# Patient Record
Sex: Female | Born: 1960 | Race: Black or African American | Hispanic: No | Marital: Married | Smoking: Never smoker
Health system: Southern US, Community
[De-identification: ages and names within clinical notes are randomized; demographics above are authoritative.]

## PROBLEM LIST (undated history)

## (undated) DIAGNOSIS — I10 Essential (primary) hypertension: Secondary | ICD-10-CM

## (undated) DIAGNOSIS — K7460 Unspecified cirrhosis of liver: Secondary | ICD-10-CM

## (undated) DIAGNOSIS — N183 Chronic kidney disease, stage 3 unspecified: Secondary | ICD-10-CM

## (undated) DIAGNOSIS — K219 Gastro-esophageal reflux disease without esophagitis: Secondary | ICD-10-CM

## (undated) DIAGNOSIS — R011 Cardiac murmur, unspecified: Secondary | ICD-10-CM

## (undated) DIAGNOSIS — E119 Type 2 diabetes mellitus without complications: Secondary | ICD-10-CM

## (undated) DIAGNOSIS — N179 Acute kidney failure, unspecified: Secondary | ICD-10-CM

## (undated) DIAGNOSIS — D869 Sarcoidosis, unspecified: Secondary | ICD-10-CM

## (undated) DIAGNOSIS — I8500 Esophageal varices without bleeding: Secondary | ICD-10-CM

## (undated) DIAGNOSIS — T7840XA Allergy, unspecified, initial encounter: Secondary | ICD-10-CM

## (undated) HISTORY — DX: Acute kidney failure, unspecified: N17.9

## (undated) HISTORY — DX: Esophageal varices without bleeding: I85.00

## (undated) HISTORY — DX: Essential (primary) hypertension: I10

## (undated) HISTORY — DX: Sarcoidosis, unspecified: D86.9

## (undated) HISTORY — DX: Cardiac murmur, unspecified: R01.1

## (undated) HISTORY — DX: Gastro-esophageal reflux disease without esophagitis: K21.9

## (undated) HISTORY — DX: Unspecified cirrhosis of liver: K74.60

## (undated) HISTORY — DX: Allergy, unspecified, initial encounter: T78.40XA

---

## 1979-11-30 HISTORY — PX: TUBAL LIGATION: SHX77

## 1985-11-29 HISTORY — PX: CARPAL TUNNEL RELEASE: SHX101

## 1995-11-30 HISTORY — PX: ABDOMINAL HYSTERECTOMY: SHX81

## 1999-08-30 ENCOUNTER — Encounter: Payer: Self-pay | Admitting: Emergency Medicine

## 1999-08-30 ENCOUNTER — Emergency Department (HOSPITAL_COMMUNITY): Admission: EM | Admit: 1999-08-30 | Discharge: 1999-08-30 | Payer: Self-pay | Admitting: Emergency Medicine

## 1999-10-12 ENCOUNTER — Encounter: Admission: RE | Admit: 1999-10-12 | Discharge: 1999-10-12 | Payer: Self-pay | Admitting: Family Medicine

## 1999-11-04 ENCOUNTER — Encounter: Admission: RE | Admit: 1999-11-04 | Discharge: 1999-11-04 | Payer: Self-pay | Admitting: Family Medicine

## 2000-04-08 ENCOUNTER — Ambulatory Visit (HOSPITAL_COMMUNITY): Admission: RE | Admit: 2000-04-08 | Discharge: 2000-04-08 | Payer: Self-pay | Admitting: *Deleted

## 2000-04-28 ENCOUNTER — Encounter: Admission: RE | Admit: 2000-04-28 | Discharge: 2000-04-28 | Payer: Self-pay | Admitting: Family Medicine

## 2000-05-09 ENCOUNTER — Encounter: Admission: RE | Admit: 2000-05-09 | Discharge: 2000-05-09 | Payer: Self-pay | Admitting: Family Medicine

## 2000-05-30 ENCOUNTER — Encounter: Admission: RE | Admit: 2000-05-30 | Discharge: 2000-05-30 | Payer: Self-pay | Admitting: Family Medicine

## 2001-01-25 ENCOUNTER — Encounter: Admission: RE | Admit: 2001-01-25 | Discharge: 2001-01-25 | Payer: Self-pay | Admitting: Family Medicine

## 2001-02-14 ENCOUNTER — Encounter: Admission: RE | Admit: 2001-02-14 | Discharge: 2001-02-14 | Payer: Self-pay | Admitting: Family Medicine

## 2001-03-08 ENCOUNTER — Encounter: Admission: RE | Admit: 2001-03-08 | Discharge: 2001-03-08 | Payer: Self-pay | Admitting: Family Medicine

## 2002-02-27 ENCOUNTER — Encounter (INDEPENDENT_AMBULATORY_CARE_PROVIDER_SITE_OTHER): Payer: Self-pay | Admitting: *Deleted

## 2002-03-28 ENCOUNTER — Encounter: Admission: RE | Admit: 2002-03-28 | Discharge: 2002-03-28 | Payer: Self-pay | Admitting: Family Medicine

## 2002-04-04 ENCOUNTER — Encounter: Admission: RE | Admit: 2002-04-04 | Discharge: 2002-04-04 | Payer: Self-pay | Admitting: Family Medicine

## 2003-07-22 ENCOUNTER — Ambulatory Visit (HOSPITAL_COMMUNITY): Admission: RE | Admit: 2003-07-22 | Discharge: 2003-07-22 | Payer: Self-pay | Admitting: Family Medicine

## 2003-07-22 ENCOUNTER — Encounter: Payer: Self-pay | Admitting: Family Medicine

## 2003-07-24 ENCOUNTER — Encounter: Admission: RE | Admit: 2003-07-24 | Discharge: 2003-07-24 | Payer: Self-pay | Admitting: Family Medicine

## 2003-07-24 ENCOUNTER — Encounter: Payer: Self-pay | Admitting: Family Medicine

## 2003-07-24 ENCOUNTER — Encounter (INDEPENDENT_AMBULATORY_CARE_PROVIDER_SITE_OTHER): Payer: Self-pay | Admitting: *Deleted

## 2003-08-27 ENCOUNTER — Encounter: Admission: RE | Admit: 2003-08-27 | Discharge: 2003-08-27 | Payer: Self-pay | Admitting: Sports Medicine

## 2003-11-30 HISTORY — PX: REDUCTION MAMMAPLASTY: SUR839

## 2005-01-22 ENCOUNTER — Ambulatory Visit: Payer: Self-pay | Admitting: Pulmonary Disease

## 2005-02-12 ENCOUNTER — Ambulatory Visit: Payer: Self-pay | Admitting: Internal Medicine

## 2005-04-01 ENCOUNTER — Ambulatory Visit: Payer: Self-pay | Admitting: Internal Medicine

## 2005-05-14 ENCOUNTER — Ambulatory Visit: Payer: Self-pay | Admitting: Internal Medicine

## 2005-05-17 ENCOUNTER — Ambulatory Visit: Payer: Self-pay | Admitting: Internal Medicine

## 2005-06-28 ENCOUNTER — Encounter: Admission: RE | Admit: 2005-06-28 | Discharge: 2005-06-28 | Payer: Self-pay | Admitting: Family Medicine

## 2005-09-17 ENCOUNTER — Ambulatory Visit: Payer: Self-pay | Admitting: Internal Medicine

## 2006-01-21 ENCOUNTER — Ambulatory Visit: Payer: Self-pay | Admitting: Internal Medicine

## 2006-01-25 ENCOUNTER — Ambulatory Visit: Payer: Self-pay | Admitting: *Deleted

## 2006-02-25 ENCOUNTER — Ambulatory Visit: Payer: Self-pay | Admitting: Internal Medicine

## 2006-04-11 ENCOUNTER — Ambulatory Visit: Payer: Self-pay | Admitting: Internal Medicine

## 2006-05-19 ENCOUNTER — Ambulatory Visit: Payer: Self-pay | Admitting: Gastroenterology

## 2006-06-07 ENCOUNTER — Ambulatory Visit: Payer: Self-pay | Admitting: Gastroenterology

## 2006-06-07 ENCOUNTER — Encounter: Payer: Self-pay | Admitting: Gastroenterology

## 2006-06-07 ENCOUNTER — Ambulatory Visit (HOSPITAL_COMMUNITY): Admission: RE | Admit: 2006-06-07 | Discharge: 2006-06-07 | Payer: Self-pay | Admitting: Gastroenterology

## 2006-06-27 ENCOUNTER — Ambulatory Visit: Payer: Self-pay | Admitting: Gastroenterology

## 2006-06-27 ENCOUNTER — Ambulatory Visit (HOSPITAL_COMMUNITY): Admission: RE | Admit: 2006-06-27 | Discharge: 2006-06-27 | Payer: Self-pay | Admitting: Gastroenterology

## 2006-07-06 ENCOUNTER — Ambulatory Visit (HOSPITAL_COMMUNITY): Admission: RE | Admit: 2006-07-06 | Discharge: 2006-07-06 | Payer: Self-pay | Admitting: Gastroenterology

## 2006-07-21 ENCOUNTER — Ambulatory Visit: Payer: Self-pay | Admitting: Gastroenterology

## 2006-07-27 ENCOUNTER — Ambulatory Visit: Payer: Self-pay | Admitting: Internal Medicine

## 2006-08-16 ENCOUNTER — Ambulatory Visit: Payer: Self-pay | Admitting: Pulmonary Disease

## 2006-09-06 ENCOUNTER — Ambulatory Visit: Payer: Self-pay | Admitting: Internal Medicine

## 2006-09-16 ENCOUNTER — Ambulatory Visit: Payer: Self-pay | Admitting: Internal Medicine

## 2006-10-07 ENCOUNTER — Ambulatory Visit: Payer: Self-pay | Admitting: Internal Medicine

## 2006-10-24 ENCOUNTER — Ambulatory Visit: Payer: Self-pay | Admitting: Internal Medicine

## 2007-01-27 ENCOUNTER — Encounter (INDEPENDENT_AMBULATORY_CARE_PROVIDER_SITE_OTHER): Payer: Self-pay | Admitting: *Deleted

## 2007-02-15 ENCOUNTER — Ambulatory Visit: Payer: Self-pay | Admitting: Internal Medicine

## 2007-05-19 ENCOUNTER — Ambulatory Visit: Payer: Self-pay | Admitting: Internal Medicine

## 2007-07-11 IMAGING — CT CT PARANASAL SINUSES LIMITED
1 of 2 series · 16 of 25 positions shown, 20 images · non-contrast
Comparison: none

CLINICAL DATA: Recurrent sinus infections, drainage, cough, pressure. 
LIMITED CT OF PARANASAL SINUSES:
TECHNIQUE: Limited coronal CT images were obtained through the paranasal sinuses without intravenous contrast.

[Series 4: ltd sinus 3.0 h30s · axial · 0.29mm/px · z∈[-77,+21]mm · 16 of 24 slices shown, 20 images]
[im 2/24  brain]
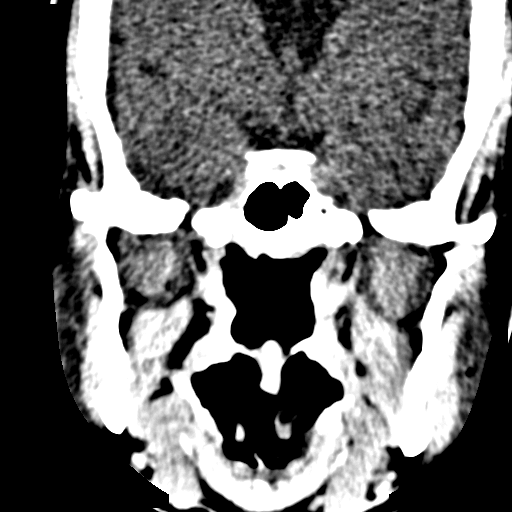
[im 2/24  bone]
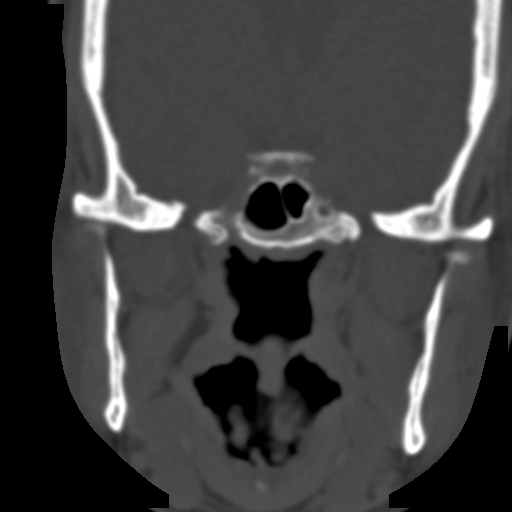
[im 4/24  bone]
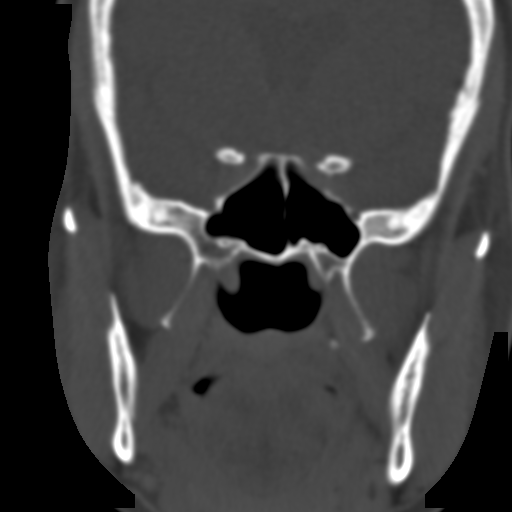
[im 5/24  bone]
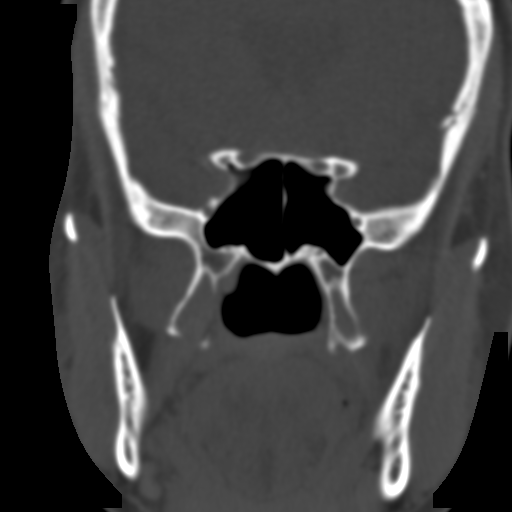
[im 6/24  bone]
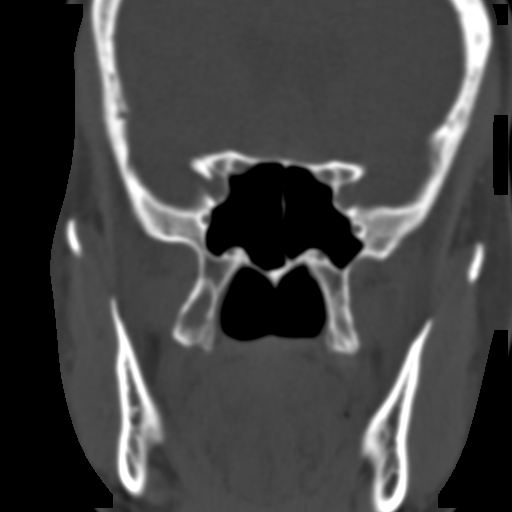
[im 8/24  brain]
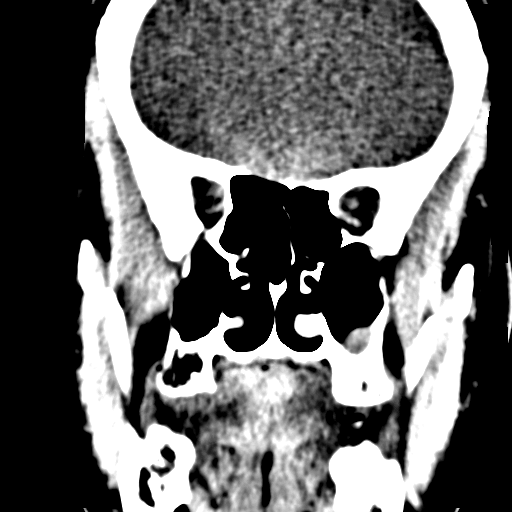
[im 8/24  bone]
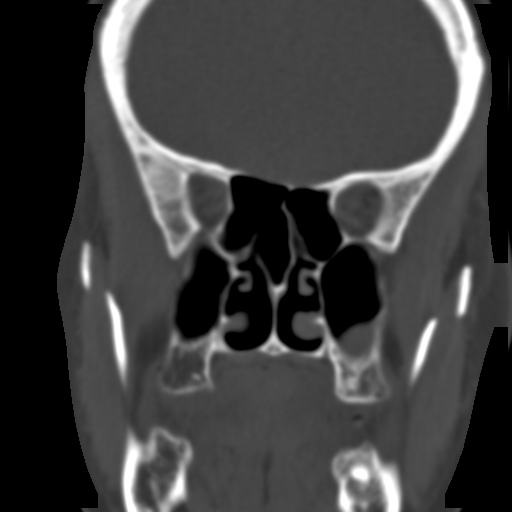
[im 9/24  bone]
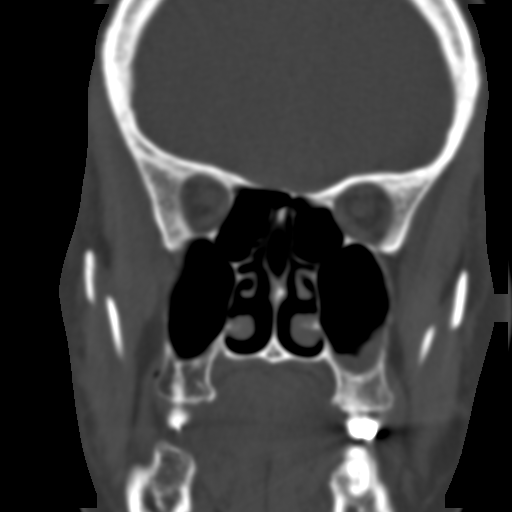
[im 10/24  bone]
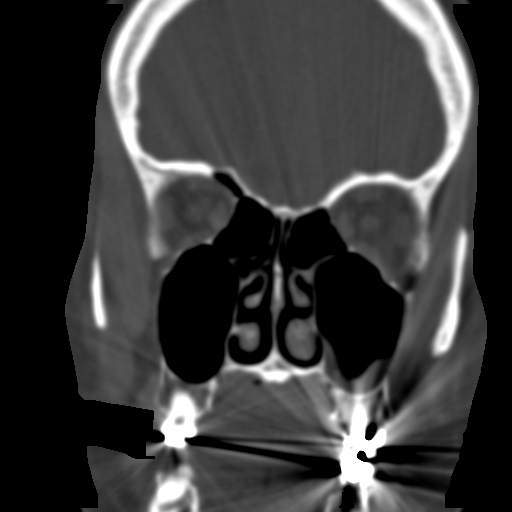
[im 12/24  bone]
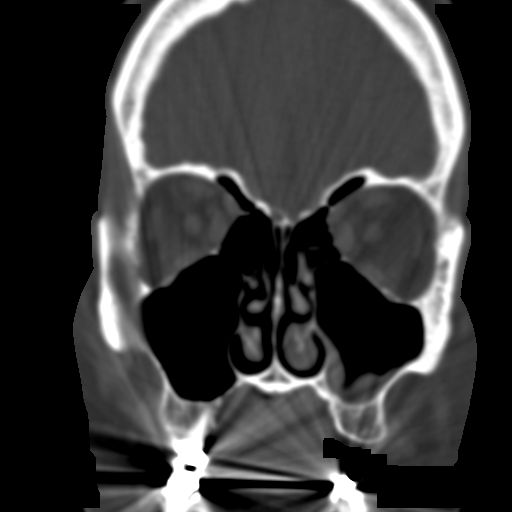
[im 13/24  brain]
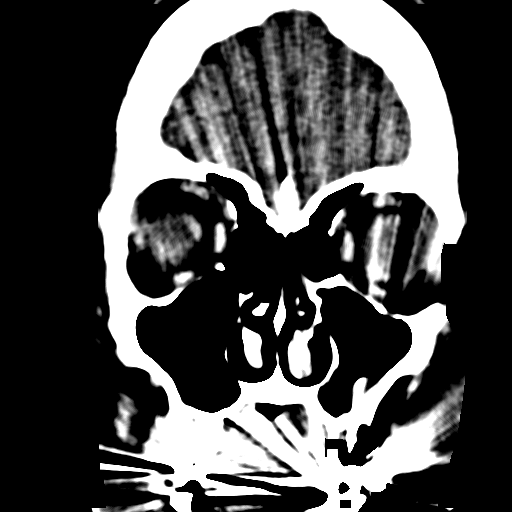
[im 13/24  bone]
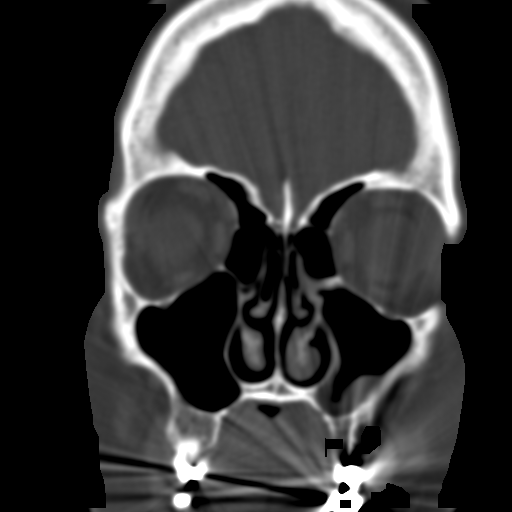
[im 15/24  bone]
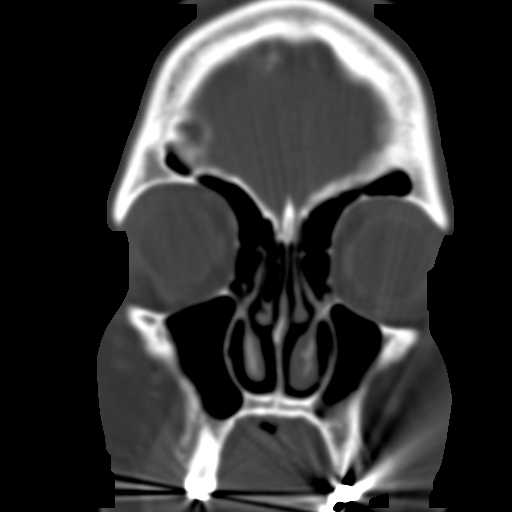
[im 16/24  bone]
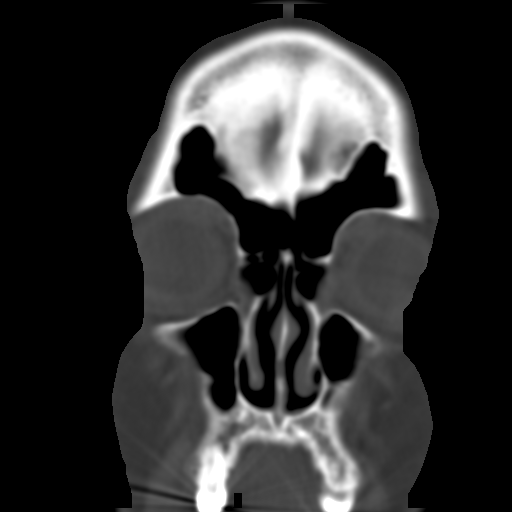
[im 17/24  bone]
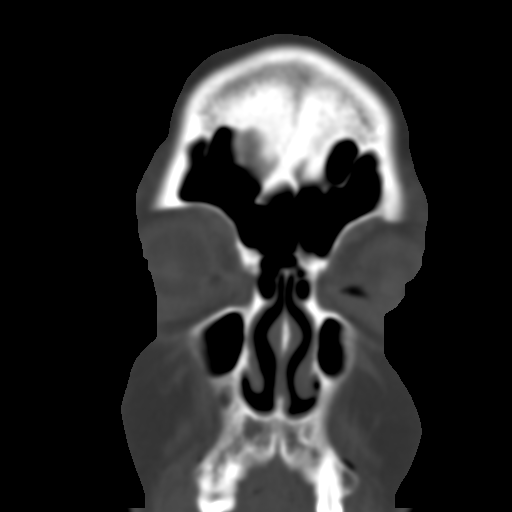
[im 19/24  brain]
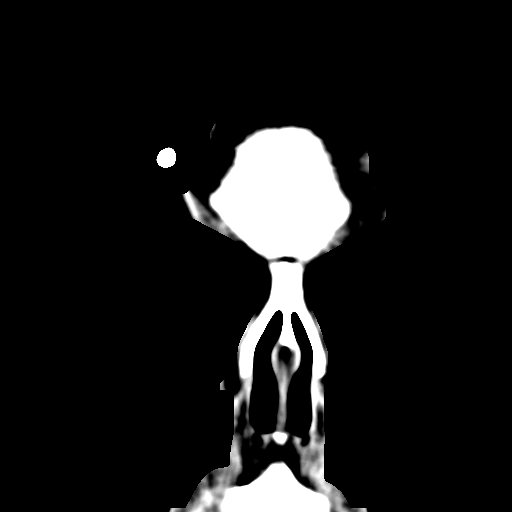
[im 19/24  bone]
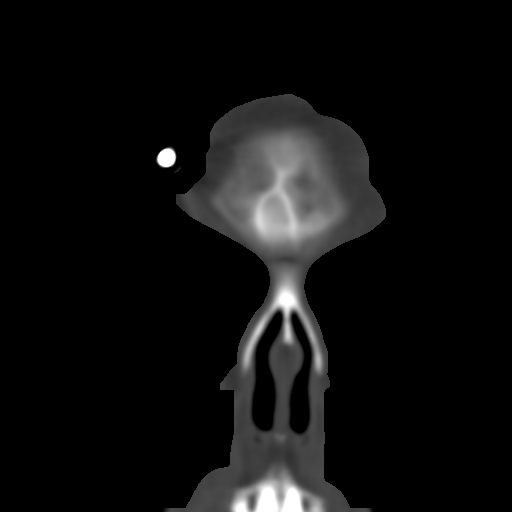
[im 20/24  bone]
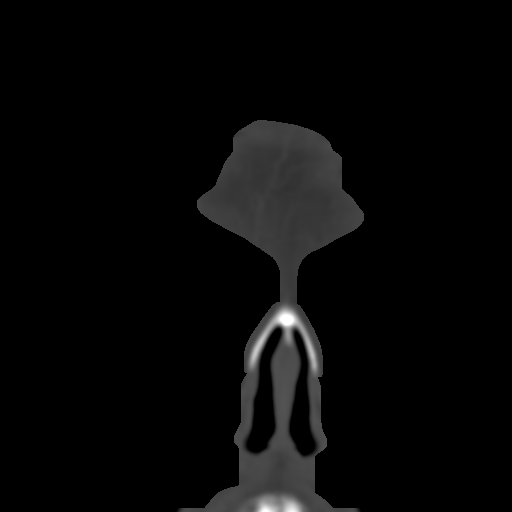
[im 21/24  bone]
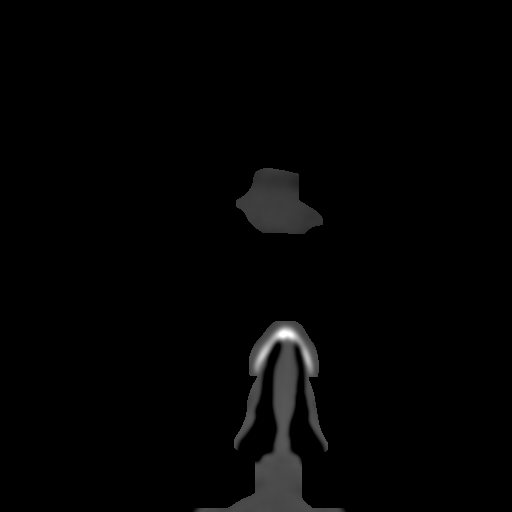
[im 23/24  bone]
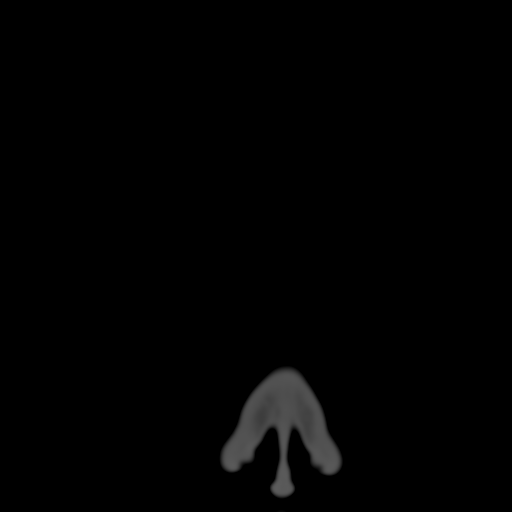

[16 of 25 positions shown; findings below may reference images not displayed]

FINDINGS: There is mucosal thickening seen associated with the inferior portion of the left maxillary sinus.  The right maxillary sinus is normally aerated as are the sphenoid and frontal sinuses.  The ethmoid sinuses are normally aerated.  There are no areas of bone destruction.
IMPRESSION: Mild mucosal thickening associated with the inferior left maxillary sinus. Otherwise, normally aerated paranasal sinuses.

## 2008-01-22 ENCOUNTER — Encounter: Payer: Self-pay | Admitting: Internal Medicine

## 2008-01-24 ENCOUNTER — Encounter: Payer: Self-pay | Admitting: Internal Medicine

## 2008-01-31 ENCOUNTER — Ambulatory Visit: Payer: Self-pay | Admitting: Gastroenterology

## 2008-03-29 ENCOUNTER — Telehealth: Payer: Self-pay | Admitting: Gastroenterology

## 2009-01-01 ENCOUNTER — Emergency Department (HOSPITAL_COMMUNITY): Admission: EM | Admit: 2009-01-01 | Discharge: 2009-01-01 | Payer: Self-pay | Admitting: Family Medicine

## 2009-11-03 ENCOUNTER — Encounter: Payer: Self-pay | Admitting: Gastroenterology

## 2009-11-06 ENCOUNTER — Telehealth: Payer: Self-pay | Admitting: Gastroenterology

## 2009-11-10 ENCOUNTER — Ambulatory Visit: Payer: Self-pay | Admitting: Gastroenterology

## 2009-11-10 DIAGNOSIS — R011 Cardiac murmur, unspecified: Secondary | ICD-10-CM | POA: Insufficient documentation

## 2009-11-10 DIAGNOSIS — D869 Sarcoidosis, unspecified: Secondary | ICD-10-CM | POA: Insufficient documentation

## 2009-11-24 ENCOUNTER — Ambulatory Visit: Payer: Self-pay | Admitting: Internal Medicine

## 2009-11-25 ENCOUNTER — Encounter: Payer: Self-pay | Admitting: Gastroenterology

## 2009-11-25 ENCOUNTER — Telehealth: Payer: Self-pay | Admitting: Gastroenterology

## 2009-11-25 DIAGNOSIS — K7460 Unspecified cirrhosis of liver: Secondary | ICD-10-CM | POA: Insufficient documentation

## 2009-12-01 ENCOUNTER — Ambulatory Visit (HOSPITAL_COMMUNITY): Admission: RE | Admit: 2009-12-01 | Discharge: 2009-12-01 | Payer: Self-pay | Admitting: Gastroenterology

## 2009-12-01 ENCOUNTER — Ambulatory Visit: Payer: Self-pay | Admitting: Gastroenterology

## 2010-01-23 ENCOUNTER — Ambulatory Visit: Payer: Self-pay | Admitting: Gastroenterology

## 2010-04-14 ENCOUNTER — Emergency Department (HOSPITAL_COMMUNITY): Admission: EM | Admit: 2010-04-14 | Discharge: 2010-04-14 | Payer: Self-pay | Admitting: Family Medicine

## 2010-12-16 ENCOUNTER — Encounter: Payer: Self-pay | Admitting: Gastroenterology

## 2010-12-31 NOTE — Miscellaneous (Signed)
  Clinical Lists Changes  Medications: Added new medication of CORGARD 40 MG TABS (NADOLOL) take 1 tab daily - Signed Rx of CORGARD 40 MG TABS (NADOLOL) take 1 tab daily;  #30 x 2;  Signed;  Entered by: Louis Meckel MD;  Authorized by: Louis Meckel MD;  Method used: [facility]  [address] [id number] [address] [place], [place], [place]  [address], Ph: [phone] or [phone], Fax: [phone]    Prescriptions: CORGARD 40 MG TABS (NADOLOL) take 1 tab daily  #30 x 2   Entered and Authorized by:   Louis Meckel MD   Signed by:   Louis Meckel MD on 12/01/2009   Method used:   [facility]        [facility]  [address] [id number]       [address]       [place], [place]  [address]       Ph: [phone] or [phone]       Fax: [phone]   RxID:   [id number]

## 2010-12-31 NOTE — Procedures (Signed)
Summary: Upper Endoscopy  Patient: [name] Note: All result statuses are Final unless otherwise noted.  Tests: (1) Upper Endoscopy (EGD)   EGD Upper Endoscopy       DONE     [facility]     [address] [place], [place]  [address]           ENDOSCOPY PROCEDURE REPORT           PATIENT:  [name], Melissa  MR#:  [id number]     BIRTHDATE:  [birth year], 48 yrs. old  GENDER:  female           ENDOSCOPIST:  Barbette Hair. Arlyce Dice, MD     Referred by:           PROCEDURE DATE:  12/01/2009     PROCEDURE:  EGD, diagnostic     ASA CLASS:  Class II     INDICATIONS:  Known esophageal varices, for surveillance exam. 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  The EG-[id number] ([id number]) endoscope was     introduced through the mouth and advanced to the third portion of     the duodenum, without limitations.  The instrument was slowly     withdrawn as the mucosa was fully examined.     <<PROCEDUREIMAGES>>           Grade I-II varices were found in the distal esophagus (see     image003 and image002). At least 2 varices distal 1/3 grade 1-2     Otherwise the examination was normal.    Retroflexed views revealed     no abnormalities.    The scope was then withdrawn from the patient     and the procedure completed.           COMPLICATIONS:  None           ENDOSCOPIC IMPRESSION:     1) Grade I- II varices in the distal esophagus     2) Otherwise normal examination     RECOMMENDATIONS:     1) Call office next 2-3 days to schedule an office appointment     for 1 month     2) begin nadolol 40mg  daily           REPEAT EXAM:  In 1 year(s) for EGD.        ______________________________     Barbette Hair. Arlyce Dice, MD           CC:  Deeann Dowse MD           n.     [name] at 12/01/2009 12:43 PM           Betsey Amen, [phone]  Note: An exclamation mark (!) indicates a result that was not dispersed into the flowsheet. Document Creation Date: 12/01/2009 12:43 PM _______________________________________________________________________  (1) Order result status: Final Collection or observation date-time: 12/01/2009 12:37 Requested date-time:  Receipt date-time:  Reported date-time:  Referring Physician:   Ordering Physician: [name] [phone]) Specimen  Source:  Source: Launa Grill Order Number: [id number] Lab site:   Appended Document: Upper Endoscopy Recall is in IDX for 11/2010.

## 2010-12-31 NOTE — Letter (Signed)
Summary: Endoscopy- Changed to Office Visit  [facility]  [address] [place], [place] [address]   Phone: [phone]  Fax: [phone]      December 16, 2010 MRN: [id number]   [facility] [address] [place], [place]  [address]   Dear Ms. VIRGEN,   According to our records, it is time for you to schedule an Endoscopy. However, after reviewing your medical record, I feel that an office visit would be most appropriate to more completely evaluate you and determine your need for a repeat procedure.  Please call [phone] (option #2) at your convenience to schedule an office visit. If you have any questions, concerns, or feel that this letter is in error, we would appreciate your call.   Sincerely,  Barbette Hair. Arlyce Dice, M.D.  [facility] [phone]

## 2010-12-31 NOTE — Assessment & Plan Note (Signed)
Summary: F/U CT AND ENDO.             Melissa [name]    History of Present Illness Visit Type: Follow-up Visit Primary GI MD: [name] MD [facility] Primary Provider: Deeann Dowse, MD Chief Complaint: Patient here to follow up from CT and EGD. She states that she is doing great. She has questions about bee pollen tablets.  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  She has portal hypertension with grade 1-2 esophageal varices and splenomegaly, all secondary to her hepatic sarcoidosis.  She was placed on that will.  She has no GI complaints.  She was evaluated by Dr. Iva Boop at [facility]  2 years ago for her liver disease.  He felt that therapy was done merited.   The mainstay of therapy is glucocorticoids.  The patient is reluctant to take this.   GI Review of Systems      Denies abdominal pain, acid reflux, belching, bloating, chest pain, dysphagia with liquids, dysphagia with solids, heartburn, loss of appetite, nausea, vomiting, vomiting blood, weight loss, and  weight gain.        Denies anal fissure, black tarry stools, change in bowel habit, constipation, diarrhea, diverticulosis, fecal incontinence, heme positive stool, hemorrhoids, irritable bowel syndrome, jaundice, light color stool, liver problems, rectal bleeding, and  rectal pain.    Current Medications (verified): 1)  Fexofenadine Hcl 180 Mg  Tabs (Fexofenadine Hcl) .... Take 1 Tablet By Mouth Once A Day As Needed For Allergies 2)  Triamterene-Hctz 37.5-25 Mg Tabs (Triamterene-Hctz) .... Once Daily 3)  Corgard 40 Mg Tabs (Nadolol) .... Take 1 Tab Daily  Allergies (verified): 1)  ! Sulfa 2)  ! Penicillin 3)  ! Codeine 4)  ! * Ketek 5)  ! Darvocet 6)  ! Percocet  Past History:  Past Medical History: Last updated: 11/07/2009 Sarcoidosis - Lungs, Liver, Spleen Allergic Rhinitis Hypertension Hepatosplenomegaly  Past Surgical History: Last updated:  11/07/2009 btl - 04/28/2000,  carpel tunnel release - 04/28/2000,  c-section - 11/29/1977,  partial hysterectomy- 2/2  bleeding - 04/29/1997  Family History: Last updated: 01/26/2007 breast ca--gm, htn-, sinus problems  Social History: Last updated: 01/26/2007 Married to air Engineer, manufacturing systems.  One child in [place].; Works at [employer].; No smoking, no alcohol  Review of Systems  The patient denies allergy/sinus, anemia, anxiety-new, arthritis/joint pain, back pain, blood in urine, breast changes/lumps, change in vision, confusion, cough, coughing up blood, depression-new, fainting, fatigue, fever, headaches-new, hearing problems, heart murmur, heart rhythm changes, itching, menstrual pain, muscle pains/cramps, night sweats, nosebleeds, pregnancy symptoms, shortness of breath, skin rash, sleeping problems, sore throat, swelling of feet/legs, swollen lymph glands, thirst - excessive , urination - excessive , urination changes/pain, urine leakage, vision changes, and voice change.    Vital Signs:  Patient profile:   50 year old female Height:      62.5 inches Weight:      177.2 pounds Pulse rate:   78 / minute Pulse rhythm:   regular BP sitting:   130 / 76  (left arm) Cuff size:   regular  Vitals Entered By: Harlow Mares CMA Duncan Dull) (January 23, 2010 2:57 PM)   Impression & Recommendations:  Problem # 1:  CIRRHOSIS (ICD-571.5) The patient has cirrhosis with portal hypertension which is stable at the present time.  She has grade 1-2 esophageal varices and splenomegaly.  There is no clinical evidence for ascites.  I expect that there  will be slow progression of her liver disease and that at some point she will require transplantation.  Unfortunately, there is no good therapy to prevent this.  The patient's #1 continue not all #2 follow up endoscopy in approximately 10 months  Patient Instructions: 1)  CC doctor Clenton Pare Prescriptions: CORGARD 40 MG TABS (NADOLOL) take 1 tab daily   #30 x 5   Entered and Authorized by:   Louis Meckel MD   Signed by:   Louis Meckel MD on 01/23/2010   Method used:   [facility]  [address] [id number]       [address]       [place], [place]  [address]       Ph: [phone] or [phone]       Fax: [phone]   RxID:   [id number]

## 2011-04-13 NOTE — Assessment & Plan Note (Signed)
[facility]                             PULMONARY OFFICE NOTE   NAME:COOPERMarshe, Melissa [name]                 MRN:          [id number]  DATE:05/19/2007                            DOB:          10/21/1961    PROBLEM LIST:  1. Sarcoid, pulmonary and liver.  2. Allergic rhinitis.   HISTORY:  Dr. Suszanne Conners treated her for what sounds like fungal otitis and  she thinks she is doing better.  Sputum is clear.  She has not  recognized adenopathy, fever, rash, or chest pain.  There has been  little to no cough.  She [facility] problems for her allergic rhinitis and has  done well with that through the spring.   MEDICATIONS:  1. Allegra 180.  2. Flonase.   Drug intolerant to PENICILLIN, SULFA, CODEINE, VICODIN, DARVOCET, and  KETEK.   OBJECTIVE:  Weight 175 pounds, BP 130/80, pulse 117, room air saturation  99%.  Heart rate is up a little and there is a trace systolic flow murmur at  the aortic space.  I do not find adenopathy or stridor.  There is no significant nasal congestion.  Conjunctivae are clear.  LUNGS:  Clear.   IMPRESSION:  Her last chest x-ray in October had been clear of any  active process or adenopathy, and her pulmonary function test at that  time had been almost normal.  Her last ACE level was elevated at 172  which is the main clue that she still has active sarcoid.  We are  repeating a chest x-ray today and schedule return for a followup in 4  months, earlier p.r.n.     Clinton D. Maple Hudson, MD, [facility], FACP  Electronically Signed    CDY/MedQ  DD: 05/20/2007  DT: 05/20/2007  Job #: [id number]   cc:   Newman Pies, MD

## 2011-04-13 NOTE — Assessment & Plan Note (Signed)
[facility]                         GASTROENTEROLOGY OFFICE NOTE   NAME:COOPERMarijose, Curington                 MRN:          [id number]  DATE:01/31/2008                            DOB:          Mar 17, 1961    PROBLEM:  Hepatosplenomegaly.   Mrs. Gauer has returned for re-evaluation.  She has known sarcoidosis  involving the liver and lungs.  She has been asymptomatic with regard to  her sarcoid.  Recent fullness in her upper abdomen prompted a visit to  Dr. Cleta Alberts, who noted massive splenomegaly.  She underwent a CT scan of  the abdomen on January 24, 2008, that demonstrated massive  hepatosplenomegaly with multiple lesions in the liver and spleen, in  addition to mesenteric and retroperitoneal low-grade adenopathy.  Weight  has been stable.  She has no other GI complaints.   MEDICATIONS:  Include Allegra, Maxzide and Flonase.   EXAM:  Pulse 88, blood pressure 112/76, weight 161.  HEENT: EOMI.  PERRLA.  Sclerae are anicteric.  Conjunctivae are pink.  NECK:  Supple without thyromegaly, adenopathy or carotid bruits.  CHEST:  Clear to auscultation and percussion without adventitious  sounds.  CARDIAC:  Regular rhythm; normal S1 S2.  There are no murmurs, gallops  or rubs.  ABDOMEN:  Liver is palpable two fingerbreadths below the right costal  margin and percusses to 13 cm.  Spleen is palpated down to the left  iliac crest.  Thee are no abdominal masses.  EXTREMITIES:  Full range of motion.  No cyanosis, clubbing or edema.  RECTAL:  There are no masses.  Stool is Hemoccult negative.   IMPRESSION:  Massive hepatosplenomegaly in this patient with known  sarcoidosis, involving the liver.  I suspect that her organomegaly is  related to infiltration by sarcoid.  Lymphoma is another consideration,  albeit less likely.   RECOMMENDATION:  Review recent CBC and CMET.  I would consider repeat  percutaneous liver biopsy to be sure that she does not have a  lymphomatous process involving the liver and spleen.  I will make  further recommendations after further consideration, including results  of her lab work.     Barbette Hair. Arlyce Dice, MD,FACG  Electronically Signed    RDK/MedQ  DD: 01/31/2008  DT: 01/31/2008  Job #: [id number]   cc:   Brett Canales A. Cleta Alberts, M.D.

## 2011-04-16 NOTE — Assessment & Plan Note (Signed)
[facility]                           GASTROENTEROLOGY OFFICE NOTE   NAME:[name], [name]                 MRN:          [id number]  DATE:06/27/2006                            DOB:          [birth year]    PROBLEM:  Abnormal liver tests.   Melissa [name] has returned following percutaneous liver biopsy.  This  demonstrated extensive non-necrotizing granulomas.  Melissa [name] feels well  and has no GI complaints.  She denies shortness of breath.   EXAMINATION:  VITAL SIGNS:  Pulse 102.  Blood pressure 104/80.  Weight 195.   IMPRESSION:  Granulomatous liver disease.  There is no evidence for  tuberculosis or fungal infection (by stains).  Her medicines are not  associated with granulomatous liver disease.  Sarcoidosis needs to be ruled  out.  Primary biliary cirrhosis and sclerosing cholangitis are much less  likely.   RECOMMENDATIONS:  1.  Chest x-ray.  2.  Check Ace level, AMA, and ANA.                                   Barbette Hair. Arlyce Dice, MD, [facility]   RDK/MedQ  DD:  06/27/2006  DT:  06/28/2006  Job #:  [id number]   cc:   Nilda Simmer, MD

## 2011-04-16 NOTE — Assessment & Plan Note (Signed)
[facility]                               PULMONARY OFFICE NOTE   NAME:COOPERKailany, Dinunzio                 MRN:          [id number]  DATE:07/27/2006                            DOB:          1961-09-26    PULMONARY CONSULTATION:   PRIMARY PHYSICIAN:  Dr. Valarie Cones, [facility].   REASON FOR CONSULTATION:  Sarcoid.   HISTORY:  A 50 year old black female who states she underwent a routine  physical examination which revealed elevated LFTs leading to a liver biopsy  dated June 08, 1999, showing extensive non-necrotizing granulomatous  inflammation associated with an ACE level of 172 dated June 27, 2006.  The  patient herself denies any significant chronic flu-like symptoms, fevers,  chills, sweats, unintended weight loss, cough or dyspnea.  She does not  remember ever being told she had an abnormal chest x-ray and does not  remember a previous x-ray being done ever, does not recall a previous x-ray.  She also denies any __________ complaints or significant myalgias or  arthralgias.   PAST MEDICAL HISTORY:  Significant for:  1. Borderline hypertension.  2. Breast reduction surgery.  3. Hysterectomy.  4. Allergic rhinitis, previous maintained on allergy injections.   ALLERGIES:  1. PENICILLIN.  2. CODEINE.  3. SULFA.  4. VICODIN.   MEDICATIONS:  Maxzide, Allegra, Prevacid.   SOCIAL HISTORY:  She has never smoked.  She works in an office setting with  no unusual travel pattern or hobby exposure history.   FAMILY HISTORY:  Significant for allergies in everyone.   REVIEW OF SYSTEMS:  Taken in detail and significant for the absence of  additional complaints.   PHYSICAL EXAMINATION:  This is a pleasant, ambulatory, healthy appearing and  moderately obese black female in no acute distress.  VITAL SIGNS:  Stable, except for a pulse rate of 118 at rest.  HEENT:  Mild turbinate edema is present with no cyanosis, pallor or polyps.  OROPHARYNX:  Clear.  NECK:  Supple without cervical adenopathy or tenderness.  TRACHEA:  __________.  LUNG FIELDS:  Perfectly clear bilaterally to auscultation and percussion.  HEART:  Regular rate and rhythm without murmur, rub or gallop.  ABDOMEN:  Soft, benign.  EXTREMITIES:  Warm without calf tenderness, cyanosis, clubbing or edema.   CT scan of the chest was reviewed from July 06, 2006, in addition to the  above information, and indicates mediastinal hilar adenopathy and  hepatosplenomegaly.   IMPRESSION:  This patient has classic sarcoid with probably involvement of  spleen and liver but apparently no symptoms.  In the absence of symptoms it  is difficult to be more aggressive than simply watching this conservatively,  because a vast majority of granulomatous inflammation will resolve over 3  years in more than three-quarters of patients.  I would like to obtain  several points on the curve, however, by asking her to return for PFTs,  chest x-ray and ACE level in three months and gave her very specific  symptoms to look for that would indicate a more urgent need to consider  treatment.  Charlaine Dalton. Sherene Sires, MD, [facility]   MBW/MedQ  DD:  07/27/2006  DT:  07/28/2006  Job #:  [id number]

## 2011-04-16 NOTE — Assessment & Plan Note (Signed)
[facility]                               PULMONARY OFFICE NOTE   NAME:COOPERAngelik, Walls                 MRN:          [id number]  DATE:08/16/2006                            DOB:          September 02, 1961    HISTORY OF PRESENT ILLNESS:  The patient is a 50 year old African-American  female patient of Dr. Maple Hudson with known history of allergic rhinitis who  presents for acute office visit.  The patient has also been recently  diagnosed with sarcoid.  The patient presents today complaining of a 3-day  history of nasal congestion, postnasal drip, left ear pressure and pain.  The patient denies any prominent sputum, fever, chest pain, orthopnea, PND,  recent travel, or antibiotic use.  The patient is maintained on daily  Allegra and weekly allergy injections 1:10.   PAST MEDICAL HISTORY:  Reviewed.   CURRENT MEDICATIONS:  Reviewed.   PHYSICAL EXAMINATION:  GENERAL:  The patient is a pleasant female in no  acute distress.  VITAL SIGNS: She is afebrile with stable vital signs.  O2 saturation is 95%  on room air.  HEENT: Nasal mucosa is pale.  Nontender sinuses.  Posterior pharynx clear  without exudate. TMs normal.  External ear canals clear.  NECK:  Supple without adenopathy.  LUNGS:  Sounds are clear with no wheeze, crackles.  CARDIAC:  Regular rate and rhythm.  ABDOMEN:  Soft.  EXTREMITIES:  Without edema.   IMPRESSION:  Acute rhinitis flare.  The patient is continued on Allegra  daily.  Add in Nasonex 2 puffs twice daily, Mucinex DM twice daily.  The  patient was given a nasal Neo-Synephrine nebulizer treatment today.  The  patient may also use Tussionex on as-needed basis for cough.  The patient is  to return here in 2-3 weeks with Dr. Maple Hudson or sooner if needed.                                   Rubye Oaks, NP                                Clinton D. Maple Hudson, MD, [facility], FACP   TP/MedQ  DD:  08/16/2006  DT:  08/17/2006  Job #:  [id number]

## 2011-04-16 NOTE — Assessment & Plan Note (Signed)
[name]                               PULMONARY OFFICE NOTE   NAME:[name], [name]                 MRN:          [id number]  DATE:09/06/2006                            DOB:          [birth year]    PROBLEM:  1. Sarcoidosis, pulmonary and liver.  2. Allergic rhinitis.   HISTORY:  I have followed her for allergic rhinitis and she continues  getting her vaccine injections at 0.4 ml per week of 1:10 administered at  the [facility] with no problems.  Since I saw her she has  been evaluated by Dr. [name] for granulomatous liver disease and was  seen by Dr. Sandrea Hughs.  A chest CT had shown mediastinal and hilar  adenopathy and hepatosplenomegaly.  Her ACE level on July 30 was 172.  Diagnosis of sarcoidosis is now established.  She asked that I continue  following her respiratory problems.  She was seen by the nurse practitioner  on September 18 with acute rhinitis.  Nasonex and Mucinex were added to her  Allegra and she was referred back to me for followup.   MEDICATION:  1. Maxzide 37.5/25.  2. Nasonex.  3. Allegra 180.  4. Black cohosh.  5. Prevacid 30 mg used as needed.  6. Allergy vaccine.  7. Occasional Tussionex.   DRUG INTOLERANCE:  PENICILLIN, SULFA, CODEINE, VICODIN, DARVOCET and KETEK.   She is pending a follow up chest x-ray and pulmonary function tests.  She  says she feels fine today except that her left ear drains occasionally and  she has some sinus pressure and post nasal drip.   OBJECTIVE:  VITAL SIGNS:  Weight 187 pounds.  Blood pressure 132/88, pulse  regular 115.  Room air saturation 98%.  HEENT:  There is some crusting in the left external auditory canal but the  tympanic membrane looks normal.  Right side is clear.  Her pharynx is clear.  NECK:  I find no cervical, axillary or supraclavicular adenopathy.  LUNGS:  Clear to P and A. CT shows a little bit of fibrotic scarring in the  apices  which is not clearly in the sarcoid pattern and can be watched.  HEART:  Heart sounds were regular without murmur or gallop.   IMPRESSION:  1. Sarcoidosis with stage 2 lung changes and hepatosplenomegaly.  2. Probable eustachian dysfunction.  3. Rhinitis with seasonal flare.   PLAN:  1. A-B otic ear drops.  2. Astelin.  3. She is going to go ahead and get her pulmonary function test and chest      x-ray as scheduled.  4. Flu vaccine.  5. Scheduled return in 1 month, earlier as needed.       Melissa D. Maple Hudson, MD, [facility], Melissa [name]      CDY/MedQ  DD:  09/11/2006  DT:  09/12/2006  Job #:  [id number]   cc:   Melissa [name], M.D.  Barbette Hair. Arlyce Dice, MD,FACG

## 2011-04-16 NOTE — Letter (Signed)
Apr 01, 2008    Attention Thom Chimes   RE:  Melissa, [name]  MRN:  [id number]  /  DOB:  10/08/1961   To whom it may concern,   Melissa [name] is under by care for GI issues.  She has enlarged liver and  spleen.  She is able to work without restrictions provided that she does  not bump any objects into her abdomen.  With that qualification, she  does not have any other limitations.   Please feel free to contact me if you have any further questions.    Sincerely,      Barbette Hair. Arlyce Dice, MD,FACG  Electronically Signed    RDK/MedQ  DD: 04/01/2008  DT: 04/01/2008  Job #: [id number]

## 2011-04-16 NOTE — Assessment & Plan Note (Signed)
[facility]                             PULMONARY OFFICE NOTE   NAME:COOPERVonnetta, Akey                 MRN:          [id number]  DATE:02/15/2007                            DOB:          1961/06/18    PROBLEM:  1. Sarcoidosis, pulmonary and liver.  2. Allergic rhinitis.   HISTORY:  She feels she has a cold over the last week or so with some  frontal headache and sinus congestion. Her ENT physician cleared out her  ear and she says that feels much better. There is a sense of nasal  congestion that pops open intermittently. She coughs more when there is  increased postnasal drainage. Nothing purulent. She had had a distinct  flu season earlier in the winter which she recovered from before the  present illness but has had little persistent chest congestion and no  fever or adenopathy. She is dieting, her weight down gradually. Allergy  vaccine continues at 1 to 10, held at 0.5 because of local reaction,  given at occupational health without subsequent problems.   MEDICATIONS:  1. Allegra 180 mg.  2. Flonase.   DRUG INTOLERANT:  PENICILLIN, SULFA, CODEINE, VICODIN, DARVOCET, AND  KETEK.   OBJECTIVE:  Weight 175 pounds, blood pressure 122/90, pulse regular 105,  room air saturation 97%.  Sniffing, turbinate edema, no polyps or mucus bridging. Pharynx is not  irritated. There is no post nasal drainage.  No adenopathy. Voice quality is normal. Dry cough, without wheeze or  rhonchi. No evident rash.   Chest x-ray in October had shown no acute cardiopulmonary disease with  no adenopathy.   Pulmonary function tests in October showed mild restriction of total  lung capacity at 76% of predicted with normal spirometry flows and  insignificant response to bronchodilator, except in small airways where  there was some response. Diffusion was reduced at 69%. Her ACE level  markedly elevated at 172 in July of 2007. Her liver biopsy had shown  none  necrotizing granulomas in July of 2007.   IMPRESSION:  Sarcoid with relatively little pulmonary involvement. I am  suspicious her nasal congestion may reflect her sarcoid although this  may be either a recent viral syndrome or seasonal allergic rhinitis.   PLAN:  For now she is going to try Sudafed-PE and Zyrtec available over-  the-counter. Scheduling return in 2 months, earlier p.r.n.     Clinton D. Maple Hudson, MD, Tonny Bollman, FACP  Electronically Signed    CDY/MedQ  DD: 02/16/2007  DT: 02/16/2007  Job #: [id number]   cc:   Newman Pies, MD  [facility]

## 2011-04-16 NOTE — Assessment & Plan Note (Signed)
[facility]                               PULMONARY OFFICE NOTE   NAME:COOPERJaylinn, Hellenbrand                 MRN:          [id number]  DATE:10/07/2006                            DOB:          Jul 12, 1961    PROBLEM:  1. Sarcoidosis, pulmonary and liver.  2. Allergic rhinitis.   HISTORY:  She continues followup at [facility] for  primary care and with Dr. [name] for GI.  She continues allergy  vaccine here, now at 0.5 ml of 1-10, getting her injections at the  [facility].  She saw Dr. Lenard Forth several times for ear  congestion and drainage.  He did a variety of tests and told her it was  just her sinus.  She has no followup scheduled but complains that when her  nose is congested, her ears get congested and she feels drainage.  She likes  Astelin.   MEDICATIONS:  1. Allegra 180 mg.  2. Astelin.  3. Maxzide.  4. She has used A-B Otic eardrops to give modest relief.   ALLERGIES:  Drug intolerance to PENICILLIN, SULFA, CODEINE, VICODIN,  DARVOCET, and KETEK.   OBJECTIVE:  Weight 187 pounds, blood pressure 112/68, pulse regular 103,  room air saturation 97%.  Right canal and ear look normal.  There is some  whitish crusting debris without obstruction in the left canal.  I do not see  obvious erythema.  There is mild nasal congestion bilaterally, without  visible polyps.  Her throat and chest are clear.  Heart sounds are regular.  I cannot feel enlargement of liver or spleen and I do not find adenopathy or  obvious rash.   IMPRESSION:  1. 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  2. Eustachian dysfunction and possible external otitis.   PLAN:  1. She will continue her allergy vaccine.  2. She will use a guaifenesin decongestant product.  3. Cipro HC Otic 0.2% 3 drops in left ear b.i.d. for 7 days.  4. Schedule return 1 month, earlier p.r.n.     Clinton D. Maple Hudson, MD,  Tonny Bollman, FACP  Electronically Signed    CDY/MedQ  DD: 10/09/2006  DT: 10/09/2006  Job #: [id number]   cc:   Dr. Benny Lennert

## 2011-04-16 NOTE — Assessment & Plan Note (Signed)
[facility]                           GASTROENTEROLOGY OFFICE NOTE   NAME:COOPERAthalene, Kolle                 MRN:          [id number]  DATE:07/21/2006                            DOB:          [birth year]    PROBLEM:  Granulomatous liver disease.   Ms. Collyer has returned for scheduled followup.  She underwent a CT of the  chest which demonstrated mediastinal and hilar adenopathy and bi-apical  scarring.  Hepatosplenomegaly was again seen with portal and celiac axis  adenopathy.   ACE level was 172.  AMA F-actin antibody tests were negative.  Ms. Requejo  continues to feel well and has no specific GI or pulmonary complaints.   On exam, pulse 80, blood pressure 122/90, weight 188.   IMPRESSION:  Sarcoidosis involving the liver, spleen and mediastinum.  The  patient remains asymptomatic.   RECOMMENDATIONS:  1. Pulmonary evaluation.  2. The patient will also get a mammogram due to an asymmetric density in      the right breast.   ADDENDUM:  Mrs. Schwartzman informed me that she did have breast reduction  surgery and subsequent removal of some tissue.  I believe this would account  for the findings of an asymmetric density in the right breast.  Mammogram  will therefore not be needed.                                    Barbette Hair. Arlyce Dice, MD, Clementeen Graham  WUJ#[id number]  RDK/MedQ  DD:  07/21/2006  DT:  07/21/2006  Job #:  [id number]

## 2011-04-16 NOTE — Assessment & Plan Note (Signed)
[facility]                           GASTROENTEROLOGY OFFICE NOTE   NAME:Melissa [name], Melissa [name]                 MRN:          [id number]  DATE:07/21/2006                            DOB:          [birth year]    ADDENDUM:  [name] informed me that she did have breast reduction  surgery and subsequent removal of some tissue.  I believe this would account  for the findings of an asymmetric density in the right breast.  Mammogram  will therefore not be needed.                                   Barbette Hair. Arlyce Dice, MD, [facility]   RDK/MedQ  DD:  07/21/2006  DT:  07/21/2006  Job #:  [id number]

## 2011-08-08 ENCOUNTER — Other Ambulatory Visit: Payer: Self-pay | Admitting: Gastroenterology

## 2011-08-24 ENCOUNTER — Other Ambulatory Visit: Payer: Self-pay | Admitting: Gastroenterology

## 2011-08-25 ENCOUNTER — Telehealth: Payer: Self-pay | Admitting: *Deleted

## 2011-08-25 NOTE — Telephone Encounter (Signed)
Called pt and left message that pt is past due on her Endoscopy. Was due in January. Denied medication until she is seen in the office or procedure scheduled

## 2011-09-06 NOTE — Telephone Encounter (Signed)
Pt has not returned call. And has not scheduled endoscopy yet. Pt is past due on yearly endoscopy We will mail out a letter today

## 2011-09-14 ENCOUNTER — Encounter: Payer: Self-pay | Admitting: Gastroenterology

## 2011-10-01 ENCOUNTER — Ambulatory Visit (AMBULATORY_SURGERY_CENTER): Payer: 59 | Admitting: *Deleted

## 2011-10-01 VITALS — Ht 62.5 in | Wt 215.0 lb

## 2011-10-01 DIAGNOSIS — I8501 Esophageal varices with bleeding: Secondary | ICD-10-CM

## 2011-10-04 ENCOUNTER — Encounter: Payer: Self-pay | Admitting: Gastroenterology

## 2011-10-08 ENCOUNTER — Encounter: Payer: Self-pay | Admitting: Gastroenterology

## 2011-10-08 ENCOUNTER — Ambulatory Visit (AMBULATORY_SURGERY_CENTER): Payer: 59 | Admitting: Gastroenterology

## 2011-10-08 VITALS — BP 146/80 | HR 76 | Temp 97.9°F | Resp 22 | Ht 62.5 in | Wt 215.0 lb

## 2011-10-08 DIAGNOSIS — I8501 Esophageal varices with bleeding: Secondary | ICD-10-CM

## 2011-10-08 MED ORDER — SODIUM CHLORIDE 0.9 % IV SOLN: 500.0000 mL | INTRAVENOUS | Status: DC

## 2011-10-08 NOTE — Patient Instructions (Signed)
Your varacies are the same.    You need to come back in 1 year for another EGD.   Resume your routine medications today.  If you have any questions, please call [phone].  Thank-you.

## 2011-10-08 NOTE — Progress Notes (Signed)
Pt said when she "is put to sleep" that she has nausea.  I asked if it was general anesthia and she said yes.  I explained that rarley did the versed and fentanyl cause the nausea.  I told Dr. Arlyce Dice and he said no orders for nausea medications.  Maw  Pt tolerated the EGD procedure very well. maw

## 2011-10-11 ENCOUNTER — Telehealth: Payer: Self-pay

## 2011-10-11 NOTE — Telephone Encounter (Signed)
Follow up Call- Patient questions:  Do you have a fever, pain , or abdominal swelling? no Pain Score  0 *  Have you tolerated food without any problems? yes  Have you been able to return to your normal activities? yes  Do you have any questions about your discharge instructions: Diet   no Medications  no Follow up visit  no  Do you have questions or concerns about your Care? no  Actions: * If pain score is 4 or above: No action needed, pain <4. Per the pt "I did not have any problems". maw

## 2011-10-14 ENCOUNTER — Other Ambulatory Visit: Payer: Self-pay | Admitting: Gastroenterology

## 2011-10-15 ENCOUNTER — Other Ambulatory Visit: Payer: Self-pay | Admitting: *Deleted

## 2011-10-15 NOTE — Telephone Encounter (Signed)
Approved by Dr Arlyce Dice to refill, sent to pharmacy today

## 2011-10-15 NOTE — Telephone Encounter (Signed)
yes

## 2011-10-15 NOTE — Telephone Encounter (Signed)
Dr Arlyce Dice, This pt needs a refill on Nadolol, Can I refill??

## 2011-12-10 ENCOUNTER — Telehealth: Payer: Self-pay | Admitting: Gastroenterology

## 2011-12-10 NOTE — Telephone Encounter (Signed)
Waiting to find out from Dr. Arlyce Dice why pt needs to follow-up with Dr. Katrinka Blazing at [facility].

## 2011-12-13 ENCOUNTER — Telehealth: Payer: Self-pay

## 2011-12-13 NOTE — Telephone Encounter (Signed)
Called Dr. Sherrie Mustache Smith's office to scheduled F/U appt for pt. Left message for office to call back.

## 2011-12-13 NOTE — Telephone Encounter (Signed)
Have called [facility] and left message for the office to call back regarding MD appt.

## 2011-12-13 NOTE — Telephone Encounter (Signed)
Message copied by Michele Mcalpine on Mon Dec 13, 2011  2:33 PM ------      Message from: [name] D      Created: Mon Dec 13, 2011  9:18 AM       She has hepatic sarcoidosis and should be seen for a followup      ----- Message -----         From: Lily Lovings, RN         Sent: 12/10/2011   2:42 PM           To: Louis Meckel, MD            Dr. Arlyce Dice,            The pt wanted to know why she needs to follow-up with Dr. Katrinka Blazing at [facility].....Marland KitchenMarland KitchenPlease advise.            Bonita Quin      ----- Message -----         From: Louis Meckel, MD         Sent: 12/07/2011  12:12 PM           To: Lily Lovings, RN            Patient should have a followup appointment with Dr. Katrinka Blazing at [facility]

## 2011-12-28 NOTE — Telephone Encounter (Signed)
Called Dr. Sherrie Mustache Smith's office again.

## 2012-01-04 NOTE — Telephone Encounter (Signed)
Pt scheduled to see Dr. Larey Dresser at [facility] 01/18/12@10 :10am. Letter mailed to pt regarding appt.

## 2012-02-10 ENCOUNTER — Other Ambulatory Visit: Payer: Self-pay | Admitting: Physician Assistant

## 2012-03-18 ENCOUNTER — Other Ambulatory Visit: Payer: Self-pay | Admitting: Physician Assistant

## 2012-03-19 ENCOUNTER — Other Ambulatory Visit: Payer: Self-pay | Admitting: Physician Assistant

## 2012-04-14 ENCOUNTER — Other Ambulatory Visit: Payer: Self-pay | Admitting: Gastroenterology

## 2012-05-06 ENCOUNTER — Other Ambulatory Visit: Payer: Self-pay | Admitting: Physician Assistant

## 2012-05-06 ENCOUNTER — Telehealth: Payer: Self-pay

## 2012-05-06 NOTE — Telephone Encounter (Signed)
Spoke with patient, last OV was 07/2011.  Is due for OV, which is why we have been decreasing her quantity of meds.  Patient notified this and will try to RTC soon.

## 2012-05-06 NOTE — Telephone Encounter (Signed)
Patient requests prescriptions be written for longer (a year?) if possible. States she has had blood work done but can't afford to keep coming back to be seen every so often. Also, the refills for only 15 at a time are more expensive, wants at least RX to be written for more than 15 at a time. RX: Nexium and Triam. Sees Dr. Milus Glazier, please call patient and advise.

## 2012-05-07 ENCOUNTER — Other Ambulatory Visit: Payer: Self-pay | Admitting: Physician Assistant

## 2012-05-07 ENCOUNTER — Encounter: Payer: Self-pay | Admitting: Internal Medicine

## 2012-05-07 ENCOUNTER — Ambulatory Visit (INDEPENDENT_AMBULATORY_CARE_PROVIDER_SITE_OTHER): Payer: 59 | Admitting: Internal Medicine

## 2012-05-07 VITALS — BP 128/83 | HR 73 | Temp 99.0°F | Resp 18 | Ht 62.5 in | Wt 227.0 lb

## 2012-05-07 DIAGNOSIS — Z6841 Body Mass Index (BMI) 40.0 and over, adult: Secondary | ICD-10-CM | POA: Insufficient documentation

## 2012-05-07 DIAGNOSIS — J329 Chronic sinusitis, unspecified: Secondary | ICD-10-CM

## 2012-05-07 DIAGNOSIS — J309 Allergic rhinitis, unspecified: Secondary | ICD-10-CM | POA: Insufficient documentation

## 2012-05-07 DIAGNOSIS — I10 Essential (primary) hypertension: Secondary | ICD-10-CM

## 2012-05-07 DIAGNOSIS — J019 Acute sinusitis, unspecified: Secondary | ICD-10-CM

## 2012-05-07 DIAGNOSIS — I8500 Esophageal varices without bleeding: Secondary | ICD-10-CM

## 2012-05-07 DIAGNOSIS — K219 Gastro-esophageal reflux disease without esophagitis: Secondary | ICD-10-CM | POA: Insufficient documentation

## 2012-05-07 MED ORDER — TRIAMTERENE-HCTZ 37.5-25 MG PO TABS: 1.0000 | ORAL_TABLET | Freq: Every day | ORAL | Status: DC

## 2012-05-07 MED ORDER — AZITHROMYCIN 250 MG PO TABS: ORAL_TABLET | ORAL | Status: AC

## 2012-05-07 MED ORDER — ESOMEPRAZOLE MAGNESIUM 40 MG PO CPDR: 40.0000 mg | DELAYED_RELEASE_CAPSULE | Freq: Every day | ORAL | Status: DC

## 2012-05-07 MED ORDER — IPRATROPIUM BROMIDE 0.03 % NA SOLN: 1.0000 | Freq: Every day | NASAL | Status: DC

## 2012-05-07 NOTE — Progress Notes (Signed)
  Subjective:    Patient ID: Ernest Mallick, female    DOB: [birth year], 51 y.o.   MRN: [id number]  HPISymptoms for about 2 weeks= nasal congestion with purulent discharge, copious postnasal drainage making her nauseated, pressure in the sinuses and ears. Has had a difficult spring with allergies leading up to this. No cough. No fever.  Also needs medication refills  Patient Active Problem List  Diagnoses  . SARCOIDOSIS  . CIRRHOSIS   splenomegaly  . Esophageal varices  . HTN (hypertension)  . GERD (gastroesophageal reflux disease)  . AR (allergic rhinitis)  . BMI 40.0-44.9, adult  She is followed at [facility] and by Dr. Arlyce Dice for her sarcoid of the lung liver and spleen. Her varices have been stable and she continues on Nadolol. Her home blood pressures have been normal on Maxzide and she has had no cardiovascular symptoms. She is not able to lose weight/ddiet is not excessive but activity is almost nil    Review of Systems  Constitutional: Negative for activity change, appetite change, fatigue and unexpected weight change.  HENT: Negative for hearing loss.   Eyes: Negative for visual disturbance.  Respiratory: Negative for shortness of breath and wheezing.   Cardiovascular: Negative for chest pain and leg swelling.  Gastrointestinal: Negative for abdominal pain.  Genitourinary: Negative for dysuria and frequency.  Musculoskeletal: Negative for gait problem.  Skin: Negative for rash.  Neurological: Negative for headaches.  Psychiatric/Behavioral: Negative for sleep disturbance and dysphoric mood.       Objective:   Physical ExamVital signs stable except BMI over 40 Conjunctiva are mildly injected/TMs are clear/nares have purulent mucus/tender right maxillary sinus/oropharynx is clear/no a.c. Nodes Heart regular without murmur although she has a history of murmur Lungs clear No peripheral edema/good peripheral pulses Neuro is intact Affect is appropriate         Assessment & Plan:  Problem #1 hypertension Problem #2 GERD Problem #3 acute sinusitis Problem #4 allergic rhinitis Problem #5 BMI over 40 Meds ordered this encounter  Medications  . ipratropium (ATROVENT) 0.03 % nasal spray    Sig: Place 1 spray into the nose at bedtime.    Dispense:  30 mL    Refill:  5  . esomeprazole (NEXIUM) 40 MG capsule    Sig: Take 1 capsule (40 mg total) by mouth daily before breakfast.    Dispense:  30 capsule    Refill:  11  . triamterene-hydrochlorothiazide (MAXZIDE-25) 37.5-25 MG per tablet    Sig: Take 1 each (1 tablet total) by mouth daily.    Dispense:  30 tablet    Refill:  5  . azithromycin (ZITHROMAX) 250 MG tablet    Sig: As zpak    Dispense:  6 tablet    Refill:  0   She is also taking Mucinex/she'll restart the Atrovent nasal spray which has worked in the past To begin a walking program She had a full laboratory evaluation in February at [facility] and she will send [place] those results

## 2012-10-23 ENCOUNTER — Other Ambulatory Visit: Payer: Self-pay | Admitting: Gastroenterology

## 2012-10-23 ENCOUNTER — Other Ambulatory Visit: Payer: Self-pay | Admitting: Internal Medicine

## 2012-10-23 MED ORDER — NADOLOL 40 MG PO TABS: 40.0000 mg | ORAL_TABLET | Freq: Every day | ORAL | Status: DC

## 2012-10-23 NOTE — Telephone Encounter (Signed)
Patients meds sent

## 2013-04-15 ENCOUNTER — Other Ambulatory Visit: Payer: Self-pay | Admitting: Internal Medicine

## 2013-04-15 ENCOUNTER — Other Ambulatory Visit: Payer: Self-pay | Admitting: Gastroenterology

## 2013-04-15 ENCOUNTER — Other Ambulatory Visit: Payer: Self-pay | Admitting: Physician Assistant

## 2013-04-15 NOTE — Telephone Encounter (Signed)
Needs office visit.

## 2013-06-12 ENCOUNTER — Other Ambulatory Visit: Payer: Self-pay | Admitting: Physician Assistant

## 2013-07-09 ENCOUNTER — Other Ambulatory Visit: Payer: Self-pay | Admitting: Physician Assistant

## 2013-07-23 ENCOUNTER — Other Ambulatory Visit: Payer: Self-pay | Admitting: Physician Assistant

## 2013-10-08 ENCOUNTER — Other Ambulatory Visit: Payer: Self-pay | Admitting: Gastroenterology

## 2013-12-28 ENCOUNTER — Ambulatory Visit (INDEPENDENT_AMBULATORY_CARE_PROVIDER_SITE_OTHER): Payer: 59 | Admitting: Family Medicine

## 2013-12-28 ENCOUNTER — Other Ambulatory Visit: Payer: Self-pay | Admitting: Physician Assistant

## 2013-12-28 ENCOUNTER — Encounter: Payer: Self-pay | Admitting: Family Medicine

## 2013-12-28 VITALS — BP 126/82 | HR 73 | Temp 98.4°F | Resp 17 | Ht 64.5 in | Wt 240.0 lb

## 2013-12-28 DIAGNOSIS — I10 Essential (primary) hypertension: Secondary | ICD-10-CM

## 2013-12-28 DIAGNOSIS — R1013 Epigastric pain: Secondary | ICD-10-CM

## 2013-12-28 DIAGNOSIS — Z6841 Body Mass Index (BMI) 40.0 and over, adult: Secondary | ICD-10-CM

## 2013-12-28 DIAGNOSIS — H9202 Otalgia, left ear: Secondary | ICD-10-CM

## 2013-12-28 DIAGNOSIS — K219 Gastro-esophageal reflux disease without esophagitis: Secondary | ICD-10-CM

## 2013-12-28 DIAGNOSIS — H6980 Other specified disorders of Eustachian tube, unspecified ear: Secondary | ICD-10-CM

## 2013-12-28 DIAGNOSIS — H9209 Otalgia, unspecified ear: Secondary | ICD-10-CM

## 2013-12-28 LAB — COMPREHENSIVE METABOLIC PANEL
ALT: 56 U/L — AB (ref 0–35)
AST: 67 U/L — ABNORMAL HIGH (ref 0–37)
Albumin: 4.1 g/dL (ref 3.5–5.2)
Alkaline Phosphatase: 151 U/L — ABNORMAL HIGH (ref 39–117)
BILIRUBIN TOTAL: 1.4 mg/dL — AB (ref 0.2–1.2)
BUN: 18 mg/dL (ref 6–23)
CO2: 28 meq/L (ref 19–32)
CREATININE: 1.14 mg/dL — AB (ref 0.50–1.10)
Calcium: 9.2 mg/dL (ref 8.4–10.5)
Chloride: 98 mEq/L (ref 96–112)
GLUCOSE: 82 mg/dL (ref 70–99)
Potassium: 3.3 mEq/L — ABNORMAL LOW (ref 3.5–5.3)
Sodium: 134 mEq/L — ABNORMAL LOW (ref 135–145)
Total Protein: 7.5 g/dL (ref 6.0–8.3)

## 2013-12-28 LAB — POCT GLYCOSYLATED HEMOGLOBIN (HGB A1C): HEMOGLOBIN A1C: 5.5

## 2013-12-28 MED ORDER — BUTALBITAL-APAP-CAFFEINE 50-325-40 MG PO TABS: 1.0000 | ORAL_TABLET | Freq: Four times a day (QID) | ORAL | Status: DC | PRN

## 2013-12-28 MED ORDER — PREDNISONE 20 MG PO TABS: ORAL_TABLET | ORAL | Status: DC

## 2013-12-28 MED ORDER — NADOLOL 40 MG PO TABS: 40.0000 mg | ORAL_TABLET | Freq: Every day | ORAL | Status: DC

## 2013-12-28 MED ORDER — ESOMEPRAZOLE MAGNESIUM 40 MG PO CPDR
40.0000 mg | DELAYED_RELEASE_CAPSULE | ORAL | Status: DC
Start: 2013-12-28 — End: 2014-09-29

## 2013-12-28 MED ORDER — TRIAMTERENE-HCTZ 37.5-25 MG PO TABS: 1.0000 | ORAL_TABLET | Freq: Every day | ORAL | Status: DC

## 2013-12-28 NOTE — Patient Instructions (Addendum)
Be consistent in working on weight loss. Do not look for quick fixes. Drink plenty of water, cut out all soft drinks and sweetened beverages. Cut out snacks. If you're going to his neck, eat a piece of fruit or some carrots or celery or something. At mealtime cut back on portions, especially taking care to avoid excessive bread, pastas, rice, potatoes.  Take a daily walk, preferably 30-45 minutes. Gradually increase distance and speed.  Take your blood pressure medicine faithfully.  Take prednisone 2 pills daily after breakfast for 3 days  Take plain Allegra or Claritin one daily to try and help the eustachian tube  Resumed taking the Nexium for your stomach. If you keep having pain we will need to refer you for a scoping procedure of your stomach.  Your headaches are probably caused by your stress mostly. Use the headache pain pills only when needed for bad headaches. Make sure you're getting enough exercise and sleep.

## 2013-12-28 NOTE — Progress Notes (Signed)
Subjective: Patient is here for a number of things. She's been having left otalgia. She has a history of eustachian tube dysfunction and it is hurting her. She has been having "sinus" headaches in the left frontal area. She apparently has not been able to use nasal sprays because of nosebleeds. She had some questions about some diet medication advertised on television. We talked about the need to be consistent and watching what she eats and getting regular exercise for dieting. She takes blood pressure medications faithfully, and feels like her blood pressure is up because of stress and the headache. She has some back pains at this doing better right now. She has epigastric pains infections get back on the Nexium for her GERD  Objective: Overweight female in no major acute distress. TMs normal. Eyes PERRLA. Fundi benign. Throat clear. Neck supple without significant nodes. Chest is clear to auscultation. Heart regular without murmurs. And soft without masses or tenderness  Assessment: Eustachian tube dysfunction and otalgia Dyspepsia and epigastric pain Hypertension Obesity Muscle tension headache Stress and anxiety  Plan: Check hemoglobin A1c and C. met  Results for orders placed in visit on 12/28/13  POCT GLYCOSYLATED HEMOGLOBIN (HGB A1C)      Result Value Range   Hemoglobin A1C 5.5     Patient is not diabetic so I'll give her a few days of prednisone to try and see if that will help and opening the eustachian tube. However take an antihistamine without decongestant. Gave some Fioricet for headaches.

## 2013-12-29 ENCOUNTER — Other Ambulatory Visit: Payer: Self-pay | Admitting: Family Medicine

## 2014-01-01 ENCOUNTER — Other Ambulatory Visit: Payer: Self-pay

## 2014-01-01 ENCOUNTER — Telehealth: Payer: Self-pay | Admitting: Family Medicine

## 2014-01-01 MED ORDER — POTASSIUM CHLORIDE CRYS ER 20 MEQ PO TBCR: 20.0000 meq | EXTENDED_RELEASE_TABLET | Freq: Every day | ORAL | Status: DC

## 2014-01-01 NOTE — Telephone Encounter (Signed)
Spoke with patient about labs she will return in one month for a follow up with Dr Merla Richesoolittle

## 2014-01-29 ENCOUNTER — Other Ambulatory Visit: Payer: Self-pay | Admitting: Family Medicine

## 2014-02-09 ENCOUNTER — Other Ambulatory Visit: Payer: Self-pay | Admitting: Family Medicine

## 2014-06-26 ENCOUNTER — Other Ambulatory Visit: Payer: Self-pay | Admitting: Family Medicine

## 2014-07-28 ENCOUNTER — Other Ambulatory Visit: Payer: Self-pay | Admitting: Physician Assistant

## 2014-08-13 ENCOUNTER — Other Ambulatory Visit: Payer: Self-pay | Admitting: Family Medicine

## 2014-09-29 ENCOUNTER — Ambulatory Visit (INDEPENDENT_AMBULATORY_CARE_PROVIDER_SITE_OTHER): Payer: 59 | Admitting: Physician Assistant

## 2014-09-29 VITALS — BP 138/88 | HR 73 | Temp 98.8°F | Resp 16 | Ht 62.0 in | Wt 234.4 lb

## 2014-09-29 DIAGNOSIS — Z6841 Body Mass Index (BMI) 40.0 and over, adult: Secondary | ICD-10-CM

## 2014-09-29 DIAGNOSIS — Z0000 Encounter for general adult medical examination without abnormal findings: Secondary | ICD-10-CM

## 2014-09-29 DIAGNOSIS — Z1239 Encounter for other screening for malignant neoplasm of breast: Secondary | ICD-10-CM

## 2014-09-29 DIAGNOSIS — Z23 Encounter for immunization: Secondary | ICD-10-CM

## 2014-09-29 DIAGNOSIS — M5431 Sciatica, right side: Secondary | ICD-10-CM

## 2014-09-29 DIAGNOSIS — K219 Gastro-esophageal reflux disease without esophagitis: Secondary | ICD-10-CM

## 2014-09-29 DIAGNOSIS — J309 Allergic rhinitis, unspecified: Secondary | ICD-10-CM

## 2014-09-29 DIAGNOSIS — Z124 Encounter for screening for malignant neoplasm of cervix: Secondary | ICD-10-CM

## 2014-09-29 DIAGNOSIS — I10 Essential (primary) hypertension: Secondary | ICD-10-CM

## 2014-09-29 DIAGNOSIS — Z1211 Encounter for screening for malignant neoplasm of colon: Secondary | ICD-10-CM

## 2014-09-29 LAB — POCT CBC
Granulocyte percent: 66.9 %G (ref 37–80)
HCT, POC: 43.5 % (ref 37.7–47.9)
HEMOGLOBIN: 14.3 g/dL (ref 12.2–16.2)
Lymph, poc: 1.3 (ref 0.6–3.4)
MCH: 29.7 pg (ref 27–31.2)
MCHC: 32.9 g/dL (ref 31.8–35.4)
MCV: 90.2 fL (ref 80–97)
MID (CBC): 0.5 (ref 0–0.9)
MPV: 5.7 fL (ref 0–99.8)
POC Granulocyte: 3.5 (ref 2–6.9)
POC LYMPH PERCENT: 23.9 %L (ref 10–50)
POC MID %: 9.2 % (ref 0–12)
Platelet Count, POC: 211 10*3/uL (ref 142–424)
RBC: 4.82 M/uL (ref 4.04–5.48)
RDW, POC: 13.2 %
WBC: 5.3 10*3/uL (ref 4.6–10.2)

## 2014-09-29 LAB — HEPATITIS C ANTIBODY: HCV AB: NEGATIVE

## 2014-09-29 LAB — LIPID PANEL
CHOL/HDL RATIO: 3.2 ratio
CHOLESTEROL: 164 mg/dL (ref 0–200)
HDL: 52 mg/dL (ref >39)
LDL Cholesterol: 99 mg/dL (ref 0–99)
TRIGLYCERIDES: 65 mg/dL (ref <150)
VLDL: 13 mg/dL (ref 0–40)

## 2014-09-29 LAB — POCT URINALYSIS DIPSTICK
Bilirubin, UA: NEGATIVE
GLUCOSE UA: NEGATIVE
Ketones, UA: NEGATIVE
Leukocytes, UA: NEGATIVE
NITRITE UA: NEGATIVE
Protein, UA: NEGATIVE
Spec Grav, UA: 1.01
UROBILINOGEN UA: 0.2
pH, UA: 7

## 2014-09-29 LAB — COMPREHENSIVE METABOLIC PANEL
ALT: 36 U/L — AB (ref 0–35)
AST: 44 U/L — ABNORMAL HIGH (ref 0–37)
Albumin: 4 g/dL (ref 3.5–5.2)
Alkaline Phosphatase: 138 U/L — ABNORMAL HIGH (ref 39–117)
BILIRUBIN TOTAL: 1.3 mg/dL — AB (ref 0.2–1.2)
BUN: 19 mg/dL (ref 6–23)
CO2: 25 mEq/L (ref 19–32)
Calcium: 9.3 mg/dL (ref 8.4–10.5)
Chloride: 105 mEq/L (ref 96–112)
Creat: 1.2 mg/dL — ABNORMAL HIGH (ref 0.50–1.10)
Glucose, Bld: 94 mg/dL (ref 70–99)
Potassium: 3.4 mEq/L — ABNORMAL LOW (ref 3.5–5.3)
Sodium: 139 mEq/L (ref 135–145)
Total Protein: 7.4 g/dL (ref 6.0–8.3)

## 2014-09-29 LAB — POCT UA - MICROSCOPIC ONLY
Casts, Ur, LPF, POC: NEGATIVE
Crystals, Ur, HPF, POC: NEGATIVE
Mucus, UA: NEGATIVE
WBC, UR, HPF, POC: NEGATIVE
Yeast, UA: NEGATIVE

## 2014-09-29 LAB — CBC AND DIFFERENTIAL
HEMATOCRIT: 44 % (ref 36–46)
Hemoglobin: 14.3 g/dL (ref 12.0–16.0)
Platelets: 211 10*3/uL (ref 150–399)
WBC: 5.3 10^3/mL

## 2014-09-29 LAB — POCT WET PREP WITH KOH
KOH Prep POC: NEGATIVE
Trichomonas, UA: NEGATIVE
Yeast Wet Prep HPF POC: NEGATIVE

## 2014-09-29 LAB — TSH: TSH: 1.091 u[IU]/mL (ref 0.350–4.500)

## 2014-09-29 MED ORDER — TRIAMTERENE-HCTZ 37.5-25 MG PO TABS: ORAL_TABLET | ORAL | Status: DC

## 2014-09-29 MED ORDER — NADOLOL 40 MG PO TABS: ORAL_TABLET | ORAL | Status: DC

## 2014-09-29 MED ORDER — MONTELUKAST SODIUM 10 MG PO TABS: 10.0000 mg | ORAL_TABLET | Freq: Every day | ORAL | Status: DC

## 2014-09-29 MED ORDER — ESOMEPRAZOLE MAGNESIUM 40 MG PO CPDR
40.0000 mg | DELAYED_RELEASE_CAPSULE | ORAL | Status: DC
Start: 2014-09-29 — End: 2015-11-11

## 2014-09-29 MED ORDER — CYCLOBENZAPRINE HCL 10 MG PO TABS: 10.0000 mg | ORAL_TABLET | Freq: Every day | ORAL | Status: DC

## 2014-09-29 MED ORDER — MELOXICAM 15 MG PO TABS: 15.0000 mg | ORAL_TABLET | Freq: Every day | ORAL | Status: DC

## 2014-09-29 NOTE — Addendum Note (Signed)
Addended by: Johnnette LitterARDWELL, Velinda Wrobel M on: 09/29/2014 02:58 PM   Modules accepted: Orders

## 2014-09-29 NOTE — Progress Notes (Signed)
Subjective:    Patient ID: Melissa [name], female    DOB: 06/16/1961, 53 y.o.   MRN: [id number]   PCP: Elvina SidleLAUENSTEIN,KURT, MD  Chief Complaint  Patient presents with  . Annual Exam    with pap      Active Ambulatory Problems    Diagnosis Date Noted  . SARCOIDOSIS 11/10/2009  . CIRRHOSIS 11/25/2009  . CARDIAC MURMUR 11/10/2009  . 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 HTN (hypertension) 05/07/2012  . GERD (gastroesophageal reflux disease) 05/07/2012  . AR (allergic rhinitis) 05/07/2012  . BMI 40.0-44.9, adult 05/07/2012   Resolved Ambulatory Problems    Diagnosis Date Noted  . No Resolved Ambulatory Problems   Past Medical History  Diagnosis Date  . Allergy   . Heart murmur   . Hypertension     Past Surgical History  Procedure Laterality Date  . Abdominal hysterectomy  1997  . Tubal ligation  1981  . Cesarean section  1979  . Breast reduction surgery  2005  . Carpal tunnel release  1987    right hand    Allergies  Allergen Reactions  . Codeine Nausea And Vomiting  . Oxycodone-Acetaminophen Nausea And Vomiting  . Penicillins Nausea And Vomiting  . Propoxyphene N-Acetaminophen Nausea And Vomiting  . Sulfonamide Derivatives Nausea And Vomiting  . Telithromycin Nausea And Vomiting    Prior to Admission medications   Medication Sig Start Date End Date Taking? Authorizing Provider  esomeprazole (NEXIUM) 40 MG capsule Take 1 capsule (40 mg total) by mouth 1 day or 1 dose. 12/28/13  Yes Peyton Najjaravid H Hopper, MD  nadolol (CORGARD) 40 MG tablet TAKE 1 TABLET (40 MG TOTAL) BY MOUTH DAILY. PATIENT NEEDS OFFICE VISIT FOR ADDITIONAL REFILLS 08/15/14  Yes Morrell RiddleSarah L Weber, PA-C  triamterene-hydrochlorothiazide (MAXZIDE-25) 37.5-25 MG per tablet TAKE 1 TABLET BY MOUTH DAILY. PATIENT NEEDS OFFICE VISIT FOR ADDITIONAL REFILLS 08/15/14  Yes Morrell RiddleSarah L Weber, PA-C    History   Social History  . Marital Status: Married    Spouse Name: Ainsley Spinnerhilip Delio, Jr    Number of Children: 1  . Years  of Education: 14+   Occupational History  . Office Specialist     Adult Medicare   Social History Main Topics  . Smoking status: Never Smoker   . Smokeless tobacco: Never Used  . Alcohol Use: No  . Drug Use: No  . Sexual Activity:    Partners: Male    Birth Control/ Protection: Surgical   Other Topics Concern  . None   Social History Narrative   Lives with her husband and two dogs. Her son is a Veterinary surgeonsheriff [place]. She feels like the grandmother to her sister's 2 children, since the sister's sudden and unexpected death in July 2015.    Family History  Problem Relation Age of Onset  . Colon cancer Neg Hx   . Hypertension Mother   . Hypertension Brother   . Kidney disease Brother     ESRD on Dialysis  . Diabetes Brother    indicated that her mother is alive. She indicated that her father is deceased. She indicated that her sister is deceased. She indicated that all of her four brothers are alive. She indicated that her son is alive.   HPI  Presents for Annual Exam with pap testing. Several years since last pap. She is S/P supracervical hysterectomy for uterine fibroids. Denies history of abnormal pap test, but notes that her cervix is very small and often difficult to locate.  Due for breast exam and mammogram. Chart indicates that she had a colonoscopy in 2012, but she denies that ever having a colonoscopy. Does not know when her last tetanus vaccine was. Received influenza vaccine last week at work (and has a lump there to prove it!)  Review of Systems  Constitutional: Positive for unexpected weight change (weight gain). Negative for fever, chills, diaphoresis, activity change, appetite change and fatigue.       "I'm pretty healthy, I just need to lose weight."  HENT: Positive for ear pain (LEFT) and sinus pressure. Negative for trouble swallowing and voice change.        Previously tried multiple OTC and prescription oral antihistamines, nasal steroid and  "other" sprays. Epistaxis with even low dose NS. Immunotherapy worked, but she is no longer able to self-administer. Thinks she tried Singulair previously, and is interested in trying again.  Eyes: Negative.   Respiratory: Negative.   Cardiovascular: Negative.   Gastrointestinal: Negative.   Endocrine: Negative.   Genitourinary: Negative.   Musculoskeletal: Positive for back pain (RIGHT lower, spasm with certain movements). Negative for myalgias, joint swelling, gait problem, neck pain and neck stiffness.  Skin: Negative.   Allergic/Immunologic: Negative.   Neurological: Positive for headaches ("sinus").  Hematological: Negative.   Psychiatric/Behavioral: Positive for sleep disturbance (on Sunday nights, thinking about the upcoming wek at work) and dysphoric mood (since sister's unexpected death 07-02-2014). Negative for suicidal ideas, hallucinations, behavioral problems, confusion, self-injury, decreased concentration and agitation. The patient is not nervous/anxious and is not hyperactive.        Objective:   Physical Exam  Constitutional: She is oriented to person, place, and time. She appears well-developed and well-nourished. She is active and cooperative. No distress.  BP 138/88 mmHg  Pulse 73  Temp(Src) 98.8 F (37.1 C) (Oral)  Resp 16  Ht 5\' 2"  (1.575 m)  Wt 234 lb 6.4 oz (106.323 kg)  BMI 42.86 kg/m2  SpO2 99% Waist circumference is 43"   HENT:  Head: Normocephalic and atraumatic.  Right Ear: Hearing, tympanic membrane, external ear and ear canal normal. No foreign bodies.  Left Ear: Hearing, tympanic membrane, external ear and ear canal normal. No foreign bodies.  Nose: Nose normal.  Mouth/Throat: Uvula is midline, oropharynx is clear and moist and mucous membranes are normal. No oral lesions. Normal dentition. No dental abscesses or uvula swelling. No oropharyngeal exudate.  Eyes: Conjunctivae, EOM and lids are normal. Pupils are equal, round, and reactive to light. Right  eye exhibits no discharge. Left eye exhibits no discharge. No scleral icterus.  Fundoscopic exam:      The right eye shows no arteriolar narrowing, no AV nicking, no exudate, no hemorrhage and no papilledema.       The left eye shows no arteriolar narrowing, no AV nicking, no exudate, no hemorrhage and no papilledema.  Neck: Trachea normal, normal range of motion and full passive range of motion without pain. Neck supple. No spinous process tenderness and no muscular tenderness present. No thyroid mass and no thyromegaly present.  Cardiovascular: Normal rate, regular rhythm, normal heart sounds, intact distal pulses and normal pulses.   Pulmonary/Chest: Effort normal and breath sounds normal. She exhibits no tenderness and no retraction. Right breast exhibits no inverted nipple, no mass, no nipple discharge, no skin change and no tenderness. Left breast exhibits no inverted nipple, no mass, no nipple discharge, no skin change and no tenderness. Breasts are symmetrical.  Scars consistent with breast reduction surgery noted.  Abdominal:  Soft. Normal appearance and bowel sounds are normal. She exhibits no distension and no mass. There is no hepatosplenomegaly. There is no tenderness. There is no rigidity, no rebound, no guarding, no CVA tenderness, no tenderness at McBurney's point and negative Murphy's sign. No hernia. Hernia confirmed negative in the right inguinal area and confirmed negative in the left inguinal area.  Genitourinary: Rectum normal and vagina normal. Rectal exam shows no external hemorrhoid and no fissure. No breast swelling, tenderness, discharge or bleeding. Pelvic exam was performed with patient supine. No labial fusion. There is no rash, tenderness, lesion or injury on the right labia. There is no rash, tenderness, lesion or injury on the left labia. Cervix exhibits no motion tenderness, no discharge and no friability. Right adnexum displays no mass, no tenderness and no fullness. Left  adnexum displays no mass, no tenderness and no fullness. No erythema, tenderness or bleeding in the vagina. No foreign body around the vagina. No signs of injury around the vagina. No vaginal discharge found.  Very difficult to identify the cervix.  Collection from presumed cervix, with very stenotic os, located on the patient's RIGHT.  Musculoskeletal: She exhibits no edema or tenderness.       Cervical back: Normal.       Thoracic back: Normal.       Lumbar back: Normal.       Back:  Lymphadenopathy:       Head (right side): No tonsillar, no preauricular, no posterior auricular and no occipital adenopathy present.       Head (left side): No tonsillar, no preauricular, no posterior auricular and no occipital adenopathy present.    She has no cervical adenopathy.    She has no axillary adenopathy.       Right: No inguinal and no supraclavicular adenopathy present.       Left: No inguinal and no supraclavicular adenopathy present.  Neurological: She is alert and oriented to person, place, and time. She has normal strength and normal reflexes. No cranial nerve deficit. She exhibits normal muscle tone. Coordination and gait normal.  Skin: Skin is warm, dry and intact. No rash noted. She is not diaphoretic. No cyanosis or erythema. Nails show no clubbing.  Psychiatric: She has a normal mood and affect. Her speech is normal and behavior is normal. Judgment and thought content normal.  Vitals reviewed.  UA with micro is unremarkable. Wet Prep is notable for 0-6 Clue Cells, 4-15 WBC CBC is normal   Visual Acuity Screening   Right eye Left eye Both eyes  Without correction:     With correction: 20/25 20/25 20/25          Assessment & Plan:  1. Annual physical exam Age appropriate anticipatory guidance provided.  2. Screening for colon cancer Needs colonoscopy. Chart indicates she had one in 2012, which she denies. She DID have Upper Endoscopy in 2012.  - Ambulatory referral to  Gastroenterology  3. Screening for breast cancer - MM Digital Screening; Future  4. Essential hypertension Continue current treatment. Encouraged healthy eating, regular exercise and weight loss. - nadolol (CORGARD) 40 MG tablet; TAKE 1 TABLET (40 MG TOTAL) BY MOUTH DAILY.  Dispense: 90 tablet; Refill: 3 - triamterene-hydrochlorothiazide (MAXZIDE-25) 37.5-25 MG per tablet; TAKE 1 TABLET BY MOUTH DAILY.  Dispense: 90 tablet; Refill: 3  5. Gastroesophageal reflux disease, esophagitis presence not specified Stable. Continue current treatment. - esomeprazole (NEXIUM) 40 MG capsule; Take 1 capsule (40 mg total) by mouth 1 day or 1 dose.  Dispense: 90 capsule; Refill: 3  6. BMI 40.0-44.9, adult Healthy eating, regular exercise strongly encouraged.  7. Sciatica, right She has episodic spasms, that feel better with heat application, suggesting muscle strain in addition to sciatica. - meloxicam (MOBIC) 15 MG tablet; Take 1 tablet (15 mg total) by mouth daily.  Dispense: 30 tablet; Refill: 0 - cyclobenzaprine (FLEXERIL) 10 MG tablet; Take 1 tablet (10 mg total) by mouth at bedtime.  Dispense: 30 tablet; Refill: 0  8. Allergic rhinitis, unspecified allergic rhinitis type Uncontrolled. Retry Singulair. If ineffective, follow-up with ENT/Allergy. - montelukast (SINGULAIR) 10 MG tablet; Take 1 tablet (10 mg total) by mouth at bedtime.  Dispense: 90 tablet; Refill: 3  9. Screening for cervical cancer Difficult to obtain specimen. Cytology and HPV testing today. If both normal/negative, repeat in 5 years.  10. Need for Tdap vaccination - Tdap vaccine greater than or equal to 7yo IM  11. Vaginal Discharge Asymptomatic. Elect against treatment today. If she develops vaginal symptoms she is to contact me-I would sent in metronidazole.  Fernande Bras, PA-C Physician Assistant-Certified [facility]

## 2014-09-29 NOTE — Patient Instructions (Signed)

## 2014-10-01 ENCOUNTER — Encounter: Payer: Self-pay | Admitting: Gastroenterology

## 2014-10-01 LAB — PAP IG AND HPV HIGH-RISK: HPV DNA High Risk: NOT DETECTED

## 2014-10-26 ENCOUNTER — Other Ambulatory Visit: Payer: Self-pay | Admitting: Physician Assistant

## 2014-10-28 NOTE — Telephone Encounter (Signed)
Chelle, do you want to give RFs on these?

## 2014-11-01 ENCOUNTER — Ambulatory Visit
Admission: RE | Admit: 2014-11-01 | Discharge: 2014-11-01 | Disposition: A | Discharge status: Home / Self Care | Payer: 59 | Source: Ambulatory Visit | Attending: Physician Assistant | Admitting: Physician Assistant

## 2014-11-01 ENCOUNTER — Other Ambulatory Visit: Payer: Self-pay | Admitting: Physician Assistant

## 2014-11-01 ENCOUNTER — Ambulatory Visit: Payer: 59

## 2014-11-01 ENCOUNTER — Encounter (INDEPENDENT_AMBULATORY_CARE_PROVIDER_SITE_OTHER): Payer: Self-pay

## 2014-11-01 DIAGNOSIS — Z1231 Encounter for screening mammogram for malignant neoplasm of breast: Secondary | ICD-10-CM

## 2014-11-18 ENCOUNTER — Telehealth: Payer: Self-pay | Admitting: *Deleted

## 2014-11-18 ENCOUNTER — Ambulatory Visit (AMBULATORY_SURGERY_CENTER): Payer: Self-pay | Admitting: *Deleted

## 2014-11-18 VITALS — Ht 62.5 in | Wt 234.0 lb

## 2014-11-18 DIAGNOSIS — Z1211 Encounter for screening for malignant neoplasm of colon: Secondary | ICD-10-CM

## 2014-11-18 DIAGNOSIS — I8500 Esophageal varices without bleeding: Secondary | ICD-10-CM

## 2014-11-18 MED ORDER — MOVIPREP 100 G PO SOLR
ORAL | Status: DC
Start: 2014-11-18 — End: 2015-01-16

## 2014-11-18 NOTE — Progress Notes (Signed)
Patient denies any allergies to eggs or soy. Patient "sleeps for days" after surgeries, pt did okay after EGD last time. Patient denies any oxygen use at home and does not take any diet/weight loss medications. EMMI education assisgned to patient on colonoscopy & EGD, this was explained and instructions given to patient. Phone note sent to Dr.Kaplan, ok for recall EGD at time of colonoscopy?

## 2014-11-18 NOTE — Telephone Encounter (Signed)
Patient here for pre-visit, after reviewing her chart it looks like she was due EGD 2013. Her last EGD was 2012 with 1 year recall due to esophageal varices. She was seen by [facility] in 2013. Patient denies any GI problems or concerns at this time. She is on  BP medications daily.  Please review, is it time for patient to have recall EGD, okay for EGD with screening colonoscopy? Please advise, thanks. Robbin PV

## 2014-11-19 NOTE — Telephone Encounter (Signed)
Noted.  Spoke with pt and appt made with Willette ClusterPaula Guenther 12-13-14 at 8:30 a.m

## 2014-11-19 NOTE — Telephone Encounter (Signed)
Okay to see extender

## 2014-11-19 NOTE — Telephone Encounter (Signed)
Since the patient has a history of esophageal varices and cirrhosis I think she should first be evaluated in the office prior to any endoscopic procedures

## 2014-11-19 NOTE — Telephone Encounter (Signed)
Dr. Arlyce DiceKaplan, Do you want to see this pt yourself or one of the extenders?  Baxter HireKristen

## 2014-12-01 NOTE — Telephone Encounter (Signed)
She needs OV before any procedures are scheduled

## 2014-12-06 ENCOUNTER — Encounter: Payer: 59 | Admitting: Gastroenterology

## 2014-12-10 ENCOUNTER — Encounter: Payer: Self-pay | Admitting: Gastroenterology

## 2014-12-13 ENCOUNTER — Ambulatory Visit (INDEPENDENT_AMBULATORY_CARE_PROVIDER_SITE_OTHER): Payer: 59 | Admitting: Nurse Practitioner

## 2014-12-13 ENCOUNTER — Encounter: Payer: Self-pay | Admitting: Nurse Practitioner

## 2014-12-13 VITALS — BP 120/74 | HR 70 | Ht 62.0 in | Wt 236.8 lb

## 2014-12-13 DIAGNOSIS — Z8719 Personal history of other diseases of the digestive system: Secondary | ICD-10-CM

## 2014-12-13 DIAGNOSIS — K7469 Other cirrhosis of liver: Secondary | ICD-10-CM

## 2014-12-13 DIAGNOSIS — Z1211 Encounter for screening for malignant neoplasm of colon: Secondary | ICD-10-CM | POA: Insufficient documentation

## 2014-12-13 MED ORDER — METOCLOPRAMIDE HCL 10 MG PO TABS: ORAL_TABLET | ORAL | Status: DC

## 2014-12-13 NOTE — Progress Notes (Signed)
HPI :   Patient is a 54 year old female known remotely to [name]. 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 In 2012 she had grade 1-2 varices on EGD. Patient has been on Nadolol. She was evaluated by [facility] (Dr. Toney Reil)  a few years back. Patient is scheduled for a screening colonoscopy. She comes in today to discuss addition of surveillance endoscopy as well. Patient looks and feels well. No abdominal pain. No history of jaundice. No gastrointestinal bleeding. Weight is stable but she plans to lose weight in the upcoming year  Past Medical History  Diagnosis Date  . Allergy     sinus allergies  . GERD (gastroesophageal reflux disease)   . Heart murmur   . Hypertension   . Esophageal varices     Family History  Problem Relation Age of Onset  . Colon cancer Neg Hx   . Esophageal cancer Neg Hx   . Hypertension Mother   . Hypertension Brother   . Kidney disease Brother     ESRD on Dialysis  . Diabetes Brother    History  Substance Use Topics  . Smoking status: Never Smoker   . Smokeless tobacco: Never Used  . Alcohol Use: No   Current Outpatient Prescriptions  Medication Sig Dispense Refill  . cyclobenzaprine (FLEXERIL) 10 MG tablet TAKE 1 TABLET (10 MG TOTAL) BY MOUTH AT BEDTIME. 30 tablet 4  . esomeprazole (NEXIUM) 40 MG capsule Take 1 capsule (40 mg total) by mouth 1 day or 1 dose. 90 capsule 3  . montelukast (SINGULAIR) 10 MG tablet Take 1 tablet (10 mg total) by mouth at bedtime. 90 tablet 3  . MOVIPREP 100 G SOLR MoviPrep (no substitutions)-TAKE AS DIRECTED. 1 kit 0  . nadolol (CORGARD) 40 MG tablet TAKE 1 TABLET (40 MG TOTAL) BY MOUTH DAILY. 90 tablet 3  . triamterene-hydrochlorothiazide (MAXZIDE-25) 37.5-25 MG per tablet TAKE 1 TABLET BY MOUTH DAILY. 90 tablet 3  . meloxicam (MOBIC) 15 MG tablet TAKE 1 TABLET (15 MG TOTAL) BY MOUTH DAILY. (Patient not taking: Reported on  12/13/2014) 30 tablet 4  . metoCLOPramide (REGLAN) 10 MG tablet Take 1 tablet 30 minutes before drinking 1st half of prep (day before procedure) and 30 minutes before drinking 2nd half of prep (day of procedure) 2 tablet 0   No current facility-administered medications for this visit.   Allergies  Allergen Reactions  . Erythromycin Nausea And Vomiting  . Codeine Nausea And Vomiting  . Ketek [Telithromycin] Nausea And Vomiting  . Oxycodone-Acetaminophen Nausea And Vomiting  . Penicillins Nausea And Vomiting  . Propoxyphene N-Acetaminophen Nausea And Vomiting  . Sulfonamide Derivatives Nausea And Vomiting  . Vicodin [Hydrocodone-Acetaminophen] Nausea And Vomiting   Review of Systems: All systems reviewed and negative except where noted in HPI.   Physical Exam: BP 120/74 mmHg  Pulse 70  Ht 5' 2" (1.575 m)  Wt 236 lb 12.8 oz (107.412 kg)  BMI 43.30 kg/m2  SpO2 99% Constitutional: Pleasant,well-developed, black female in no acute distress. HEENT: Normocephalic and atraumatic. Conjunctivae are normal. No scleral icterus. Neck supple.  Cardiovascular: Normal rate, regular rhythm.  Pulmonary/chest: Effort normal and breath sounds normal. No wheezing, rales or rhonchi. Abdominal: Soft, nondistended, nontender. Bowel sounds active throughout. There are no masses palpable. No hepatomegaly. Extremities: no edema Lymphadenopathy: No cervical adenopathy noted. Neurological: Alert and oriented to person place and time. Skin: Skin is warm and dry. No  rashes noted. Psychiatric: Normal mood and affect. Behavior is normal.   ASSESSMENT AND PLAN:  4. 54 year old female with history of cirrhosis (sarcoidosis), complicated by portal hypertension. She had grade 1-2 varices on last EGD in 2012 and is due for surveillance endoscopy. On chronic Nadolol. The benefits, risks, and potential complications of EGD with possible biopsies were discussed with the patient and she agrees to proceed.   2. Colon  cancer screening. Patient is already scheduled for colonoscopy. We will add on EGD as mentioned above. This be her first colonoscopy, she is a little nervous. The risks, benefits, and alternatives to colonoscopy with possible biopsy and possible polypectomy were discussed with the patient and she consents to proceed.

## 2014-12-13 NOTE — Patient Instructions (Signed)
Please keep your scheduled endoscopy/colonoscopy.  We have sent a prescription to your pharmacy for you to take reglan 10 mg 30 minutes before the 1st half of your prep (day before procedure) and 30 minutes before the 2nd half of your prep (day of procedure).  WU:[id number]:Chelle Leotis ShamesJeffery, PA-C

## 2014-12-16 NOTE — Progress Notes (Signed)
Reviewed and agree with management. Robert D. Kaplan, M.D., FACG  

## 2015-01-03 ENCOUNTER — Telehealth: Payer: Self-pay | Admitting: Physician Assistant

## 2015-01-03 NOTE — Telephone Encounter (Signed)
Form completed with data from CPE 09/29/2014. Only information lacking is the number of hours she had fasted prior to having the labs drawn.  Please contact the patient for that information and document on the form.  Please make copy of completed form to scan.  Form placed in box at nurses' desk.

## 2015-01-06 NOTE — Telephone Encounter (Signed)
Form faxed this am. Called pt to let her know. Advised to call me back.

## 2015-01-16 ENCOUNTER — Ambulatory Visit (HOSPITAL_COMMUNITY)
Admission: RE | Admit: 2015-01-16 | Discharge: 2015-01-16 | Disposition: A | Discharge status: Home / Self Care | Payer: 59 | Source: Ambulatory Visit | Attending: Gastroenterology | Admitting: Gastroenterology

## 2015-01-16 ENCOUNTER — Ambulatory Visit (AMBULATORY_SURGERY_CENTER): Payer: 59 | Admitting: Gastroenterology

## 2015-01-16 ENCOUNTER — Encounter: Payer: Self-pay | Admitting: Gastroenterology

## 2015-01-16 ENCOUNTER — Other Ambulatory Visit: Payer: Self-pay | Admitting: *Deleted

## 2015-01-16 VITALS — BP 104/65 | HR 63 | Temp 98.0°F | Resp 12 | Ht 62.0 in | Wt 236.0 lb

## 2015-01-16 DIAGNOSIS — K7469 Other cirrhosis of liver: Secondary | ICD-10-CM

## 2015-01-16 DIAGNOSIS — Z8719 Personal history of other diseases of the digestive system: Secondary | ICD-10-CM

## 2015-01-16 DIAGNOSIS — K5732 Diverticulitis of large intestine without perforation or abscess without bleeding: Secondary | ICD-10-CM | POA: Insufficient documentation

## 2015-01-16 DIAGNOSIS — Z1211 Encounter for screening for malignant neoplasm of colon: Secondary | ICD-10-CM

## 2015-01-16 MED ORDER — SODIUM CHLORIDE 0.9 % IV SOLN: 500.0000 mL | INTRAVENOUS | Status: DC

## 2015-01-16 NOTE — Progress Notes (Signed)
Patient's BE scheduled for today at 1:15pm. [facility] to go [facility] on [facility]. Nothing by mouth until after the procedure. Patient and family aware.

## 2015-01-16 NOTE — Patient Instructions (Addendum)
YOU HAD AN ENDOSCOPIC PROCEDURE TODAY AT [facility]: Refer to the procedure report that was given to you for any specific questions about what was found during the examination.  If the procedure report does not answer your questions, please call your gastroenterologist to clarify.  If you requested that your care partner not be given the details of your procedure findings, then the procedure report has been included in a sealed envelope for you to review at your convenience later.  YOU SHOULD EXPECT: Some feelings of bloating in the abdomen. Passage of more gas than usual.  Walking can help get rid of the air that was put into your GI tract during the procedure and reduce the bloating. If you had a lower endoscopy (such as a colonoscopy or flexible sigmoidoscopy) you may notice spotting of blood in your stool or on the toilet paper. If you underwent a bowel prep for your procedure, then you may not have a normal bowel movement for a few days.  DIET: Your first meal following the procedure should be a light meal and then it is ok to progress to your normal diet.  A half-sandwich or bowl of soup is an example of a good first meal.  Heavy or fried foods are harder to digest and may make you feel nauseous or bloated.  Likewise meals heavy in dairy and vegetables can cause extra gas to form and this can also increase the bloating.  Drink plenty of fluids but you should avoid alcoholic beverages for 24 hours. Try to increase the fiber in your diet.  ACTIVITY: Your care partner should take you home directly after the procedure.  You should plan to take it easy, moving slowly for the rest of the day.  You can resume normal activity the day after the procedure however you should NOT DRIVE or use heavy machinery for 24 hours (because of the sedation medicines used during the test).    SYMPTOMS TO REPORT IMMEDIATELY: A gastroenterologist can be reached at any hour.  During normal business hours, 8:30  AM to 5:00 PM Monday through Friday, call [phone].  After hours and on weekends, please call the GI answering service at [phone] who will take a message and have the physician on call contact you.   Following lower endoscopy (colonoscopy or flexible sigmoidoscopy):  Excessive amounts of blood in the stool  Significant tenderness or worsening of abdominal pains  Swelling of the abdomen that is new, acute  Fever of 100F or higher  Following upper endoscopy (EGD)  Vomiting of blood or coffee ground material  New chest pain or pain under the shoulder blades  Painful or persistently difficult swallowing  New shortness of breath  Fever of 100F or higher  Black, tarry-looking stools  FOLLOW UP: If any biopsies were taken you will be contacted by phone or by letter within the next 1-3 weeks.  Call your gastroenterologist if you have not heard about the biopsies in 3 weeks.  Our staff will call the home number listed on your records the next business day following your procedure to check on you and address any questions or concerns that you may have at that time regarding the information given to you following your procedure. This is a courtesy call and so if there is no answer at the home number and we have not heard from you through the emergency physician on call, we will assume that you have returned to your regular daily activities  without incident.  SIGNATURES/CONFIDENTIALITY: You and/or your care partner have signed paperwork which will be entered into your electronic medical record.  These signatures attest to the fact that that the information above on your After Visit Summary has been reviewed and is understood.  Full responsibility of the confidentiality of this discharge information lies with you and/or your care-partner.  Try to read the handouts given to you by your recovery room nurse.  Go to Radiology at [facility] for Barium Enema instructions and prep.   The 3rd floor CMA will call you with the date and time. Her name is Robbin.

## 2015-01-16 NOTE — Op Note (Addendum)
[facility] [address] [place], [id number]   ENDOSCOPY PROCEDURE REPORT  PATIENT: Melissa [name], Melissa H  MR#: [id number] BIRTHDATE: [birth year] , 53  yrs. old GENDER: female ENDOSCOPIST: Louis Meckelobert D Kaplan, MD REFERRED BY:  Janace Hoardavid Hopper, M.D. PROCEDURE DATE:  01/16/2015 PROCEDURE:  EGD, diagnostic ASA CLASS:     Class II INDICATIONS:  screening for varices.  history of cirrhosis and trace varices MEDICATIONS: Residual sedation present, Monitored anesthesia care, and Propofol 50 mg IV TOPICAL ANESTHETIC:  DESCRIPTION OF PROCEDURE: After the risks benefits and alternatives of the procedure were thoroughly explained, informed consent was obtained.  The LB [id number] endoscope was introduced through the mouth and advanced to the second portion of the duodenum , Without limitations.  The instrument was slowly withdrawn as the mucosa was fully examined.      EXAM: The esophagus and gastroesophageal junction were completely normal in appearance.  The stomach was entered and closely examined.The antrum, angularis, and lesser curvature were well visualized, including a retroflexed view of the cardia and fundus. The stomach wall was normally distensable.  The scope passed easily through the pylorus into the duodenum.  Retroflexed views revealed no abnormalities.     The scope was then withdrawn from the patient and the procedure completed.  COMPLICATIONS: There were no immediate complications.  ENDOSCOPIC IMPRESSION: Normal appearing esophagus and GE junction, the stomach was well visualized and normal in appearance, normal appearing duodenum . no varices were seen  RECOMMENDATIONS: Endoscopy   2 years  REPEAT EXAM:  eSigned:  Louis Meckelobert D Kaplan, MD 01/26/2015 6:29 PM Revised: 01/26/2015 6:29 PM   CC:

## 2015-01-16 NOTE — Progress Notes (Signed)
Report to PACU, RN, vss, BBS= Clear.  

## 2015-01-16 NOTE — Op Note (Addendum)
[facility] [address] [place], [id number]   COLONOSCOPY PROCEDURE REPORT  PATIENT: Melissa [name], Melissa H  MR#: [id number] BIRTHDATE: 08-Jun-1961 , 53  yrs. old GENDER: female ENDOSCOPIST: Louis Meckelobert D Kaplan, MD REFERRED XB:JYNWGBY:David Hopper, M.D. PROCEDURE DATE:  01/16/2015 PROCEDURE:   Colonoscopy, diagnostic (incomplete) First Screening Colonoscopy - Avg.  risk and is 50 yrs.  old or older Yes.  Prior Negative Screening - Now for repeat screening. N/A  History of Adenoma - Now for follow-up colonoscopy & has been > or = to 3 yrs.  N/A  Polyps Removed Today? No.  Recommend repeat exam, <10 yrs? No. ASA CLASS:   Class II INDICATIONS:average risk patient for colon cancer. MEDICATIONS: Monitored anesthesia care and Propofol 250 mg IV, lidocaine 200 mg IV  DESCRIPTION OF PROCEDURE:   After the risks benefits and alternatives of the procedure were thoroughly explained, informed consent was obtained.  The digital rectal exam revealed no abnormalities of the rectum.   The LB [id number] and LB [id number]  endoscope was introduced through the anus and advanced to the sigmoid colon. No adverse events experienced. Limited by a tortuous colon.   .  In the proximal sigmoid colon there was severe angulation.  Despite multiple attempts with both the adult and the pediatric colonoscope I was unable to pass the scope beyond this area.  There was muscular hypertrophy and diverticula present.   The quality of the prep was excellent using Suprep  The instrument was then slowly withdrawn as the colon was fully examined.      COLON FINDINGS: There was severe diverticulosis noted in the sigmoid colon with associated colonic narrowing, colonic spasm, angulation and muscular hypertrophy. I was unable to pass the adult and pediatric colonoscopes beyond this area.  The colon distal to this area was normal.  .  Retroflexed views revealed no abnormalities. The time to sigmoid=16  minutes 05 seconds.  Withdrawal time=  . The scope was withdrawn and the procedure completed. COMPLICATIONS: There were no immediate complications.  ENDOSCOPIC IMPRESSION: 1.  Incomplete colonoscopy due to diverticular disease and severe angulation  RECOMMENDATIONS: Barium enema  eSigned:  Louis Meckelobert D Kaplan, MD 01/26/2015 6:33 PM Revised: 01/26/2015 6:33 PM  cc:

## 2015-01-17 ENCOUNTER — Telehealth: Payer: Self-pay

## 2015-01-17 NOTE — Telephone Encounter (Signed)
  Follow up Call-  Call back number 01/16/2015  Post procedure Call Back phone  # [phone] hm  Permission to leave phone message Yes     Patient questions:  Do you have a fever, pain , or abdominal swelling? No. Pain Score  0 *  Have you tolerated food without any problems? Yes.    Have you been able to return to your normal activities? Yes.    Do you have any questions about your discharge instructions: Diet   No. Medications  No. Follow up visit  No.  Do you have questions or concerns about your Care? No.  Actions: * If pain score is 4 or above: No action needed, pain <4.  Per the pt, "I am gasy".  She said she is passing flatus.  I encouraged her to continue to do so and call us if needed.  Pt said she would. maw

## 2015-01-17 NOTE — Progress Notes (Signed)
Quick Note:  Please inform the patient that barium enema was remarkable. No further GI workup. ______

## 2015-03-13 ENCOUNTER — Telehealth (HOSPITAL_COMMUNITY): Payer: Self-pay | Admitting: Family Medicine

## 2015-03-13 ENCOUNTER — Encounter (HOSPITAL_COMMUNITY): Payer: Self-pay | Admitting: Emergency Medicine

## 2015-03-13 ENCOUNTER — Emergency Department (INDEPENDENT_AMBULATORY_CARE_PROVIDER_SITE_OTHER)
Admission: EM | Admit: 2015-03-13 | Discharge: 2015-03-13 | Disposition: A | Discharge status: Home / Self Care | Payer: 59 | Source: Home / Self Care | Attending: Family Medicine | Admitting: Family Medicine

## 2015-03-13 DIAGNOSIS — J029 Acute pharyngitis, unspecified: Secondary | ICD-10-CM | POA: Diagnosis not present

## 2015-03-13 LAB — POCT RAPID STREP A: Streptococcus, Group A Screen (Direct): NEGATIVE

## 2015-03-13 MED ORDER — IPRATROPIUM BROMIDE 0.06 % NA SOLN: 2.0000 | Freq: Four times a day (QID) | NASAL | Status: DC

## 2015-03-13 MED ORDER — PREDNISONE 10 MG PO TABS: 30.0000 mg | ORAL_TABLET | Freq: Every day | ORAL | Status: DC

## 2015-03-13 MED ORDER — BENZONATATE 200 MG PO CAPS: 200.0000 mg | ORAL_CAPSULE | Freq: Three times a day (TID) | ORAL | Status: DC | PRN

## 2015-03-13 NOTE — ED Provider Notes (Signed)
Melissa [name] is a 54 y.o. female who presents to Urgent Care today for sore throat cough congestion postnasal drip. Symptoms present for 3 days. No vomiting or diarrhea. Patient has tried Advil which helps some. No fevers or chills chest pains or palpitations. Patient has not tolerated steroid nasal sprays in the past.   Past Medical History  Diagnosis Date  . Allergy     sinus allergies  . GERD (gastroesophageal reflux disease)   . Heart murmur   . Hypertension   . Esophageal varices    Past Surgical History  Procedure Laterality Date  . Abdominal hysterectomy  1997  . Tubal ligation  1981  . Cesarean section  1979  . Breast reduction surgery  2005  . Carpal tunnel release  1987    right hand   History  Substance Use Topics  . Smoking status: Never Smoker   . Smokeless tobacco: Never Used  . Alcohol Use: No   ROS as above Medications: No current facility-administered medications for this encounter.   Current Outpatient Prescriptions  Medication Sig Dispense Refill  . benzonatate (TESSALON) 200 MG capsule Take 1 capsule (200 mg total) by mouth 3 (three) times daily as needed for cough. 20 capsule 0  . cyclobenzaprine (FLEXERIL) 10 MG tablet TAKE 1 TABLET (10 MG TOTAL) BY MOUTH AT BEDTIME. 30 tablet 4  . esomeprazole (NEXIUM) 40 MG capsule Take 1 capsule (40 mg total) by mouth 1 day or 1 dose. 90 capsule 3  . ipratropium (ATROVENT) 0.06 % nasal spray Place 2 sprays into both nostrils 4 (four) times daily. 15 mL 1  . meloxicam (MOBIC) 15 MG tablet TAKE 1 TABLET (15 MG TOTAL) BY MOUTH DAILY. (Patient not taking: Reported on 12/13/2014) 30 tablet 4  . metoCLOPramide (REGLAN) 10 MG tablet Take 1 tablet 30 minutes before drinking 1st half of prep (day before procedure) and 30 minutes before drinking 2nd half of prep (day of procedure) (Patient not taking: Reported on 01/16/2015) 2 tablet 0  . montelukast (SINGULAIR) 10 MG tablet Take 1 tablet (10 mg total) by mouth at bedtime.  (Patient not taking: Reported on 01/16/2015) 90 tablet 3  . nadolol (CORGARD) 40 MG tablet TAKE 1 TABLET (40 MG TOTAL) BY MOUTH DAILY. 90 tablet 3  . predniSONE (DELTASONE) 10 MG tablet Take 3 tablets (30 mg total) by mouth daily. 15 tablet 0  . triamterene-hydrochlorothiazide (MAXZIDE-25) 37.5-25 MG per tablet TAKE 1 TABLET BY MOUTH DAILY. 90 tablet 3   Allergies  Allergen Reactions  . Erythromycin Nausea And Vomiting  . Codeine Nausea And Vomiting  . Ketek [Telithromycin] Nausea And Vomiting  . Oxycodone-Acetaminophen Nausea And Vomiting  . Penicillins Nausea And Vomiting  . Propoxyphene N-Acetaminophen Nausea And Vomiting  . Sulfonamide Derivatives Nausea And Vomiting  . Vicodin [Hydrocodone-Acetaminophen] Nausea And Vomiting     Exam:  BP 147/93 mmHg  Pulse 78  Temp(Src) 98.3 F (36.8 C) (Oral)  Resp 16  SpO2 100% Gen: Well NAD HEENT: EOMI,  MMM as your first cobblestoning. Normal tympanic membranes bilaterally. Clear nasal discharge present. Lungs: Normal work of breathing. CTABL Heart: RRR no MRG Abd: NABS, Soft. Nondistended, Nontender Exts: Brisk capillary refill, warm and well perfused.   Results for orders placed or performed during the hospital encounter of 03/13/15 (from the past 24 hour(s))  POCT rapid strep A [facility])     Status: None   Collection Time: 03/13/15  4:29 PM  Result Value Ref Range   Streptococcus,  Group A Screen (Direct) NEGATIVE NEGATIVE   No results found.  Assessment and Plan: 54 y.o. female with pharyngitis likely viral. Treat with prednisone and Atrovent nasal spray and Tessalon.  Discussed warning signs or symptoms. Please see discharge instructions. Patient expresses understanding.     Rodolph BongEvan S Justinian Miano, MD 03/13/15 [phone]

## 2015-03-13 NOTE — ED Notes (Signed)
Sore throat, sinus infection, sinus drainage :symptoms for 3 days.

## 2015-03-13 NOTE — Discharge Instructions (Signed)
Thank you for coming in today. °Call or go to the emergency room if you get worse, have trouble breathing, have chest pains, or palpitations.  ° °Pharyngitis °Pharyngitis is redness, pain, and swelling (inflammation) of your pharynx.  °CAUSES  °Pharyngitis is usually caused by infection. Most of the time, these infections are from viruses (viral) and are part of a cold. However, sometimes pharyngitis is caused by bacteria (bacterial). Pharyngitis can also be caused by allergies. Viral pharyngitis may be spread from person to person by coughing, sneezing, and personal items or utensils (cups, forks, spoons, toothbrushes). Bacterial pharyngitis may be spread from person to person by more intimate contact, such as kissing.  °SIGNS AND SYMPTOMS  °Symptoms of pharyngitis include:   °· Sore throat.   °· Tiredness (fatigue).   °· Low-grade fever.   °· Headache. °· Joint pain and muscle aches. °· Skin rashes. °· Swollen lymph nodes. °· Plaque-like film on throat or tonsils (often seen with bacterial pharyngitis). °DIAGNOSIS  °Your health care provider will ask you questions about your illness and your symptoms. Your medical history, along with a physical exam, is often all that is needed to diagnose pharyngitis. Sometimes, a rapid strep test is done. Other lab tests may also be done, depending on the suspected cause.  °TREATMENT  °Viral pharyngitis will usually get better in 3-4 days without the use of medicine. Bacterial pharyngitis is treated with medicines that kill germs (antibiotics).  °HOME CARE INSTRUCTIONS  °· Drink enough water and fluids to keep your urine clear or pale yellow.   °· Only take over-the-counter or prescription medicines as directed by your health care provider:   °¨ If you are prescribed antibiotics, make sure you finish them even if you start to feel better.   °¨ Do not take aspirin.   °· Get lots of rest.   °· Gargle with 8 oz of salt water (½ tsp of salt per 1 qt of water) as often as every 1-2  hours to soothe your throat.   °· Throat lozenges (if you are not at risk for choking) or sprays may be used to soothe your throat. °SEEK MEDICAL CARE IF:  °· You have large, tender lumps in your neck. °· You have a rash. °· You cough up green, yellow-brown, or bloody spit. °SEEK IMMEDIATE MEDICAL CARE IF:  °· Your neck becomes stiff. °· You drool or are unable to swallow liquids. °· You vomit or are unable to keep medicines or liquids down. °· You have severe pain that does not go away with the use of recommended medicines. °· You have trouble breathing (not caused by a stuffy nose). °MAKE SURE YOU:  °· Understand these instructions. °· Will watch your condition. °· Will get help right away if you are not doing well or get worse. °Document Released: 11/15/2005 Document Revised: 09/05/2013 Document Reviewed: 07/23/2013 °[employer]® Patient Information ©2015 [employer]. This information is not intended to replace advice given to you by your health care provider. Make sure you discuss any questions you have with your health care provider. ° ° ° °

## 2015-03-17 LAB — CULTURE, GROUP A STREP: Strep A Culture: NEGATIVE

## 2015-04-07 NOTE — ED Notes (Signed)
Note opened in error  Laporche Martelle S Lugene Hitt, MD 04/07/15 0804 

## 2015-04-08 ENCOUNTER — Ambulatory Visit (INDEPENDENT_AMBULATORY_CARE_PROVIDER_SITE_OTHER): Payer: 59 | Admitting: Emergency Medicine

## 2015-04-08 VITALS — BP 138/80 | HR 74 | Temp 97.9°F | Resp 16 | Ht 62.5 in | Wt 241.0 lb

## 2015-04-08 DIAGNOSIS — D692 Other nonthrombocytopenic purpura: Secondary | ICD-10-CM

## 2015-04-08 LAB — POCT UA - MICROSCOPIC ONLY
Bacteria, U Microscopic: NEGATIVE
CASTS, UR, LPF, POC: NEGATIVE
Crystals, Ur, HPF, POC: NEGATIVE
Mucus, UA: NEGATIVE
RBC, URINE, MICROSCOPIC: NEGATIVE
WBC, Ur, HPF, POC: NEGATIVE
Yeast, UA: NEGATIVE

## 2015-04-08 LAB — CBC WITH DIFFERENTIAL/PLATELET
BASOS ABS: 0 10*3/uL (ref 0.0–0.1)
Basophils Relative: 0 % (ref 0–1)
EOS PCT: 3 % (ref 0–5)
Eosinophils Absolute: 0.1 10*3/uL (ref 0.0–0.7)
HCT: 38.4 % (ref 36.0–46.0)
HEMOGLOBIN: 13.4 g/dL (ref 12.0–15.0)
Lymphocytes Relative: 28 % (ref 12–46)
Lymphs Abs: 1.3 10*3/uL (ref 0.7–4.0)
MCH: 29.8 pg (ref 26.0–34.0)
MCHC: 34.9 g/dL (ref 30.0–36.0)
MCV: 85.5 fL (ref 78.0–100.0)
MPV: 9.1 fL (ref 8.6–12.4)
Monocytes Absolute: 0.3 10*3/uL (ref 0.1–1.0)
Monocytes Relative: 6 % (ref 3–12)
NEUTROS ABS: 3 10*3/uL (ref 1.7–7.7)
Neutrophils Relative %: 63 % (ref 43–77)
PLATELETS: 175 10*3/uL (ref 150–400)
RBC: 4.49 MIL/uL (ref 3.87–5.11)
RDW: 14.5 % (ref 11.5–15.5)
WBC: 4.7 10*3/uL (ref 4.0–10.5)

## 2015-04-08 LAB — POCT URINALYSIS DIPSTICK
Bilirubin, UA: NEGATIVE
Blood, UA: NEGATIVE
Glucose, UA: NEGATIVE
Ketones, UA: NEGATIVE
Leukocytes, UA: NEGATIVE
NITRITE UA: NEGATIVE
PH UA: 7
Protein, UA: NEGATIVE
Spec Grav, UA: 1.01
Urobilinogen, UA: 1

## 2015-04-08 LAB — APTT: APTT: 34 s (ref 24–37)

## 2015-04-08 NOTE — Progress Notes (Signed)
[facility] [address], [place] [id number] [phone]  Date:  04/08/2015   Name:  Melissa MallickDelana Hinton [name]   DOB:  09/16/1961   MRN:  [id number]  PCP:  Chonda Baney,CHELLE, PA-C    Chief Complaint: Rash and other   History of Present Illness:  Melissa [name] is a 54 y.o. very pleasant female patient who presents with the following:  Patient with multiple allergies has challenged them with nuts recently Now to office with over a week duration of dark lesions on legs and arms Says has few on trunk No fever or chills. Not pruritic. No cough or wheezing.  No shortness of breath No coryza. No nausea or vomiting.  No stool change No abdominal pain No new meds or personal care products. No improvement with over the counter medications or other home remedies.  Denies other complaint or health concern today.   Patient Active Problem List   Diagnosis Date Noted  . History of esophageal varices 12/13/2014  . Colon cancer screening 12/13/2014  . 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 HTN (hypertension) 05/07/2012  . GERD (gastroesophageal reflux disease) 05/07/2012  . AR (allergic rhinitis) 05/07/2012  . BMI 40.0-44.9, adult 05/07/2012  . Hepatic cirrhosis 11/25/2009  . SARCOIDOSIS 11/10/2009  . CARDIAC MURMUR 11/10/2009    Past Medical History  Diagnosis Date  . Allergy     sinus allergies  . GERD (gastroesophageal reflux disease)   . Heart murmur   . Hypertension   . Esophageal varices     Past Surgical History  Procedure Laterality Date  . Abdominal hysterectomy  1997  . Tubal ligation  1981  . Cesarean section  1979  . Breast reduction surgery  2005  . Carpal tunnel release  1987    right hand    History  Substance Use Topics  . Smoking status: Never Smoker   . Smokeless tobacco: Never Used  . Alcohol Use: No    Family History  Problem Relation Age of Onset  . Colon cancer Neg Hx   . Esophageal cancer Neg Hx   . Rectal cancer Neg Hx    . Stomach cancer Neg Hx   . Hypertension Mother   . Hypertension Brother   . Kidney disease Brother     ESRD on Dialysis  . Diabetes Brother     Allergies  Allergen Reactions  . Erythromycin Nausea And Vomiting  . Codeine Nausea And Vomiting  . Ketek [Telithromycin] Nausea And Vomiting  . Oxycodone-Acetaminophen Nausea And Vomiting  . Penicillins Nausea And Vomiting  . Propoxyphene N-Acetaminophen Nausea And Vomiting  . Sulfonamide Derivatives Nausea And Vomiting  . Vicodin [Hydrocodone-Acetaminophen] Nausea And Vomiting    Medication list has been reviewed and updated.  Current Outpatient Prescriptions on File Prior to Visit  Medication Sig Dispense Refill  . cyclobenzaprine (FLEXERIL) 10 MG tablet TAKE 1 TABLET (10 MG TOTAL) BY MOUTH AT BEDTIME. 30 tablet 4  . montelukast (SINGULAIR) 10 MG tablet Take 1 tablet (10 mg total) by mouth at bedtime. 90 tablet 3  . nadolol (CORGARD) 40 MG tablet TAKE 1 TABLET (40 MG TOTAL) BY MOUTH DAILY. 90 tablet 3  . esomeprazole (NEXIUM) 40 MG capsule Take 1 capsule (40 mg total) by mouth 1 day or 1 dose. (Patient not taking: Reported on 04/08/2015) 90 capsule 3  . ipratropium (ATROVENT) 0.06 % nasal spray Place 2 sprays into both nostrils 4 (four) times daily. (Patient not taking: Reported on 04/08/2015) 15 mL 1  .  metoCLOPramide (REGLAN) 10 MG tablet Take 1 tablet 30 minutes before drinking 1st half of prep (day before procedure) and 30 minutes before drinking 2nd half of prep (day of procedure) (Patient not taking: Reported on 04/08/2015) 2 tablet 0  . triamterene-hydrochlorothiazide (MAXZIDE-25) 37.5-25 MG per tablet TAKE 1 TABLET BY MOUTH DAILY. (Patient not taking: Reported on 04/08/2015) 90 tablet 3   No current facility-administered medications on file prior to visit.    Review of Systems:  Review of Systems  Constitutional: Negative for fever, chills and fatigue.  HENT: Negative for congestion, ear pain, hearing loss, postnasal drip,  rhinorrhea and sinus pressure.   Eyes: Negative for discharge and redness.  Respiratory: Negative for cough, shortness of breath and wheezing.   Cardiovascular: Negative for chest pain and leg swelling.  Gastrointestinal: Negative for nausea, vomiting, abdominal pain, constipation and blood in stool.  Genitourinary: Negative for dysuria, urgency and frequency.  Musculoskeletal: Negative for neck stiffness.  Skin: Negative for rash.  Neurological: Negative for seizures, weakness and headaches.   Physical Examination: Filed Vitals:   04/08/15 1128  BP: 138/80  Pulse: 74  Temp: 97.9 F (36.6 C)  Resp: 16   Filed Vitals:   04/08/15 1128  Height: 5' 2.5" (1.588 m)  Weight: 241 lb (109.317 kg)   Body mass index is 43.35 kg/(m^2). Ideal Body Weight: Weight in (lb) to have BMI = 25: 138.6  GEN: WDWN, NAD, Non-toxic, A & O x 3 HEENT: Atraumatic, Normocephalic. Neck supple. No masses, No LAD. Ears and Nose: No external deformity. CV: RRR, No M/G/R. No JVD. No thrill. No extra heart sounds. PULM: CTA B, no wheezes, crackles, rhonchi. No retractions. No resp. distress. No accessory muscle use. ABD: S, NT, ND, +BS. No rebound. No HSM. EXTR: No c/c/e NEURO Normal gait.  PSYCH: Normally interactive. Conversant. Not depressed or anxious appearing.  Calm demeanor.  SKIN:  Purpuric eruption varying sized lesions.  Predominate on feet and ankles.  Less ascending to knees Fewer on arms.  Assessment and Plan: 1. Purpura  - CBC with Differential/Platelet - Comprehensive metabolic panel - POCT urinalysis dipstick - POCT UA - Microscopic Only - Amylase - Lipase - Ambulatory referral to Dermatology - PT AND PTT    Signed Phillips OdorJeffery Tareka Jhaveri, MD

## 2015-04-09 LAB — COMPREHENSIVE METABOLIC PANEL
ALT: 29 U/L (ref 0–35)
AST: 33 U/L (ref 0–37)
Albumin: 3.7 g/dL (ref 3.5–5.2)
Alkaline Phosphatase: 120 U/L — ABNORMAL HIGH (ref 39–117)
BILIRUBIN TOTAL: 1.2 mg/dL (ref 0.2–1.2)
BUN: 18 mg/dL (ref 6–23)
CALCIUM: 9.5 mg/dL (ref 8.4–10.5)
CO2: 25 mEq/L (ref 19–32)
Chloride: 105 mEq/L (ref 96–112)
Creat: 1.2 mg/dL — ABNORMAL HIGH (ref 0.50–1.10)
Glucose, Bld: 107 mg/dL — ABNORMAL HIGH (ref 70–99)
POTASSIUM: 3.7 meq/L (ref 3.5–5.3)
SODIUM: 141 meq/L (ref 135–145)
TOTAL PROTEIN: 7.3 g/dL (ref 6.0–8.3)

## 2015-04-09 LAB — PROTIME-INR
INR: 1.05 (ref <1.50)
PROTHROMBIN TIME: 13.7 s (ref 11.6–15.2)

## 2015-04-09 LAB — AMYLASE: Amylase: 121 U/L — ABNORMAL HIGH (ref 0–105)

## 2015-04-09 LAB — LIPASE: Lipase: 41 U/L (ref 0–75)

## 2015-06-05 ENCOUNTER — Ambulatory Visit (INDEPENDENT_AMBULATORY_CARE_PROVIDER_SITE_OTHER): Payer: 59 | Admitting: Family Medicine

## 2015-06-05 VITALS — BP 115/83 | HR 76 | Temp 99.1°F | Resp 16 | Ht 62.25 in | Wt 222.0 lb

## 2015-06-05 DIAGNOSIS — R070 Pain in throat: Secondary | ICD-10-CM | POA: Diagnosis not present

## 2015-06-05 DIAGNOSIS — R634 Abnormal weight loss: Secondary | ICD-10-CM

## 2015-06-05 DIAGNOSIS — R0981 Nasal congestion: Secondary | ICD-10-CM | POA: Diagnosis not present

## 2015-06-05 DIAGNOSIS — B370 Candidal stomatitis: Secondary | ICD-10-CM

## 2015-06-05 DIAGNOSIS — R112 Nausea with vomiting, unspecified: Secondary | ICD-10-CM | POA: Diagnosis not present

## 2015-06-05 DIAGNOSIS — J309 Allergic rhinitis, unspecified: Secondary | ICD-10-CM

## 2015-06-05 DIAGNOSIS — J019 Acute sinusitis, unspecified: Secondary | ICD-10-CM

## 2015-06-05 LAB — POCT RAPID STREP A (OFFICE): RAPID STREP A SCREEN: NEGATIVE

## 2015-06-05 LAB — POCT SEDIMENTATION RATE: POCT SED RATE: 45 mm/h — AB (ref 0–22)

## 2015-06-05 MED ORDER — AMOXICILLIN-POT CLAVULANATE 500-125 MG PO TABS: 1.0000 | ORAL_TABLET | Freq: Three times a day (TID) | ORAL | Status: DC

## 2015-06-05 MED ORDER — ONDANSETRON 8 MG PO TBDP: 8.0000 mg | ORAL_TABLET | Freq: Three times a day (TID) | ORAL | Status: DC | PRN

## 2015-06-05 MED ORDER — CETIRIZINE HCL 10 MG PO TABS: 10.0000 mg | ORAL_TABLET | Freq: Every day | ORAL | Status: DC

## 2015-06-05 MED ORDER — GUAIFENESIN ER 1200 MG PO TB12: 1.0000 | ORAL_TABLET | Freq: Two times a day (BID) | ORAL | Status: DC | PRN

## 2015-06-05 MED ORDER — BUDESONIDE 32 MCG/ACT NA SUSP: 1.0000 | Freq: Two times a day (BID) | NASAL | Status: DC

## 2015-06-05 NOTE — Patient Instructions (Addendum)
Please let us know if your throat is not getting any better.  Please use the medications as prescribed.   Please attempt to use the cetirizine to combat allergies.  It may have been for a while that you took this, and you may find relief of your allergy symptoms.    I will have your lab results in the next 10 days.

## 2015-06-05 NOTE — Progress Notes (Signed)
[facility] [address] [place] [address] [phone]  Date:  06/05/2015   Name:  Melissa [name]   DOB:  05-12-1961   MRN:  [id number]  PCP:  JEFFERY,CHELLE, PA-C    History of Present Illness:  Melissa [name] is a 54 y.o. female patient who presents to [facility] for 1 week of throat pain, tinnitus, dizziness, sinus pain, and nausea.  She states that she has this bilateral throat and mouth pain for the last week.  The pain is along the tip of her tongue, oral mucosa, and throat.  She has eaten very little due to the pain.  She describes the mouth as having white film initially, but is now a darkened tongue, and sores in her mouth.  She tried to eat saltines today that sent her railing in pain.  She also has a swishing sound (water-like)  in her ears, that she can not endorse occurs with movement.  Following the sound, she has intense nausea, and some dizziness.  She has taken very little for her symptoms but mucinex.  She has a hx of ear issues, and was recommended for eustachian tube placement which she declines.  She has no diarrhea, constipation, or blood in the stool.  She does have a hx of thrush following steroid use.  She states that over the last few months, she took different rounds of steroid totalling about 11 days worth of steroid use.   3 months ago she is noted to have had a viral pharyngitis that prompted purpura.   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  She has never consumed large amounts of alcohol.  There is no hepatitis hx.  She states that she is tested for HIV within the last year, which was negative.  She has no hx of diabetes, or anemia since her hysterectomy.      She states that she did not notice the weight loss, but has had less of an appetite only for this last week.   She also complains of ongoing sinus allergies and congestion.  There is nasal pain which she points to the upper bridge of her nose.  She currently does not  take anything but the mucinex.  She states that the cetirizine, allegra, nor the claritin work.  She had not attempted them for months.  Nasonex, nasacort, flonase, atrovent, all make her nose bleed.    Patient Active Problem List   Diagnosis Date Noted  . History of esophageal varices 12/13/2014  . Colon cancer screening 12/13/2014  . 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 HTN (hypertension) 05/07/2012  . GERD (gastroesophageal reflux disease) 05/07/2012  . AR (allergic rhinitis) 05/07/2012  . BMI 40.0-44.9, adult 05/07/2012  . Hepatic cirrhosis 11/25/2009  . SARCOIDOSIS 11/10/2009  . CARDIAC MURMUR 11/10/2009    Past Medical History  Diagnosis Date  . Allergy     sinus allergies  . GERD (gastroesophageal reflux disease)   . Heart murmur   . Hypertension   . Esophageal varices     Past Surgical History  Procedure Laterality Date  . Abdominal hysterectomy  1997  . Tubal ligation  1981  . Cesarean section  1979  . Breast reduction surgery  2005  . Carpal tunnel release  1987    right hand    History  Substance Use Topics  . Smoking status: Never Smoker   . Smokeless tobacco: Never Used  . Alcohol Use: No  Family History  Problem Relation Age of Onset  . Colon cancer Neg Hx   . Esophageal cancer Neg Hx   . Rectal cancer Neg Hx   . Stomach cancer Neg Hx   . Hypertension Mother   . Hypertension Brother   . Kidney disease Brother     ESRD on Dialysis  . Diabetes Brother     Allergies  Allergen Reactions  . Erythromycin Nausea And Vomiting  . Codeine Nausea And Vomiting  . Ketek [Telithromycin] Nausea And Vomiting  . Oxycodone-Acetaminophen Nausea And Vomiting  . Penicillins Nausea And Vomiting  . Propoxyphene N-Acetaminophen Nausea And Vomiting  . Sulfonamide Derivatives Nausea And Vomiting  . Vicodin [Hydrocodone-Acetaminophen] Nausea And Vomiting    Medication list has been reviewed and updated.  Current Outpatient Prescriptions on File Prior to Visit   Medication Sig Dispense Refill  . nadolol (CORGARD) 40 MG tablet TAKE 1 TABLET (40 MG TOTAL) BY MOUTH DAILY. 90 tablet 3  . triamterene-hydrochlorothiazide (MAXZIDE-25) 37.5-25 MG per tablet TAKE 1 TABLET BY MOUTH DAILY. 90 tablet 3  . cyclobenzaprine (FLEXERIL) 10 MG tablet TAKE 1 TABLET (10 MG TOTAL) BY MOUTH AT BEDTIME. (Patient not taking: Reported on 06/05/2015) 30 tablet 4  . esomeprazole (NEXIUM) 40 MG capsule Take 1 capsule (40 mg total) by mouth 1 day or 1 dose. (Patient not taking: Reported on 04/08/2015) 90 capsule 3  . ipratropium (ATROVENT) 0.06 % nasal spray Place 2 sprays into both nostrils 4 (four) times daily. (Patient not taking: Reported on 04/08/2015) 15 mL 1  . metoCLOPramide (REGLAN) 10 MG tablet Take 1 tablet 30 minutes before drinking 1st half of prep (day before procedure) and 30 minutes before drinking 2nd half of prep (day of procedure) (Patient not taking: Reported on 04/08/2015) 2 tablet 0  . montelukast (SINGULAIR) 10 MG tablet Take 1 tablet (10 mg total) by mouth at bedtime. (Patient not taking: Reported on 06/05/2015) 90 tablet 3   No current facility-administered medications on file prior to visit.    ROS ROS otherwise unremarkable unless listed above.    Physical Examination: BP 115/83 mmHg  Pulse 76  Temp(Src) 99.1 F (37.3 C) (Oral)  Resp 16  Ht 5' 2.25" (1.581 m)  Wt 222 lb (100.699 kg)  BMI 40.29 kg/m2  SpO2 95% Ideal Body Weight: Weight in (lb) to have BMI = 25: 137.5 Wt Readings from Last 3 Encounters:  06/05/15 222 lb (100.699 kg)  04/08/15 241 lb (109.317 kg)  01/16/15 236 lb (107.049 kg)    Physical Exam  Constitutional: She is oriented to person, place, and time. She appears well-developed and well-nourished. No distress.  HENT:  Head: Normocephalic and atraumatic.  Right Ear: Tympanic membrane, external ear and ear canal normal.  Left Ear: Tympanic membrane, external ear and ear canal normal.  Nose: Rhinorrhea present. Right sinus exhibits  no maxillary sinus tenderness and no frontal sinus tenderness. Left sinus exhibits no maxillary sinus tenderness and no frontal sinus tenderness.  Mouth/Throat: Oral lesions (Oral mucosa with red sores overlayed with white tissue.  ) present. No uvula swelling. Posterior oropharyngeal erythema present. No oropharyngeal exudate, posterior oropharyngeal edema or tonsillar abscesses.  Geographic tongue, with redness and dark tiny macules along the buds.   Eyes: Conjunctivae and EOM are normal. Pupils are equal, round, and reactive to light.  Neck: Normal range of motion. Neck supple. No thyromegaly present.  Cardiovascular: Normal rate, regular rhythm, normal heart sounds and intact distal pulses.  Exam reveals no friction rub.  No murmur heard. Pulmonary/Chest: Effort normal and breath sounds normal. No respiratory distress. She has no wheezes.  Abdominal: Soft. Bowel sounds are normal. She exhibits no distension. There is no splenomegaly. There is no tenderness.  Lymphadenopathy:       Head (right side): No submandibular, no tonsillar, no preauricular and no posterior auricular adenopathy present.       Head (left side): No submandibular, no tonsillar, no preauricular and no posterior auricular adenopathy present.    She has no cervical adenopathy.       Right: No supraclavicular adenopathy present.       Left: No supraclavicular adenopathy present.  Neurological: She is alert and oriented to person, place, and time.  Skin: Skin is warm and dry. She is not diaphoretic.  Hyperpigmented fading purpuric non-raised lesions diffused along the lower extremities without tenderness or swelling.    Psychiatric: She has a normal mood and affect. Her behavior is normal.    Results for orders placed or performed in visit on 06/05/15  POCT rapid strep A  Result Value Ref Range   Rapid Strep A Screen Negative Negative     Assessment and Plan: 54 year old female with past medical history listed above  is here today for sore throat, tinnitus-like symptoms, dizziness, and nausea. The sore throat is likely from an episode of thrush which has happened in her past. This is likely trifold down the esophagus. I have advised her to use nystatin, swishing in the mouth was long as possible (minutes) and then swallowing. I've advised her to restart the cetirizine.   -Treating for sinus infection which appears to be as posterior as the ethmoids. Prescribing Augmentin coupled with Zofran for nausea. We will attempt Rhinocort as intranasal steroid. She will continue the Nexium as prescribed. I have advised her to follow-up with her PCP, Melissa [name], in the next 2-3 weeks to monitor this weight loss.  1. Thrush, oral - Nystatin POWD; 5 mLs by Does not apply route 4 (four) times daily. 5ml swish and swallow  Dispense: 1 Bottle; Refill: 0  2. Subacute sinusitis, unspecified location - amoxicillin-clavulanate (AUGMENTIN) 500-125 MG per tablet; Take 1 tablet (500 mg total) by mouth 3 (three) times daily.  Dispense: 21 tablet; Refill: 0 - Guaifenesin (MUCINEX MAXIMUM STRENGTH) 1200 MG TB12; Take 1 tablet (1,200 mg total) by mouth every 12 (twelve) hours as needed.  Dispense: 14 tablet; Refill: 1 - cetirizine (ZYRTEC) 10 MG tablet; Take 1 tablet (10 mg total) by mouth daily.  Dispense: 30 tablet; Refill: 5  3. Throat pain - POCT SEDIMENTATION RATE - POCT rapid strep A - CBC with Differential/Platelet - Culture, Group A Strep - budesonide (RHINOCORT AQUA) 32 MCG/ACT nasal spray; Place 1 spray into both nostrils 2 (two) times daily.  Dispense: 8.3 mL; Refill: 0 - amoxicillin-clavulanate (AUGMENTIN) 500-125 MG per tablet; Take 1 tablet (500 mg total) by mouth 3 (three) times daily.  Dispense: 21 tablet; Refill: 0  4. Loss of weight - POCT SEDIMENTATION RATE - CBC with Differential/Platelet - Culture, Group A Strep  5. Nasal congestion - budesonide (RHINOCORT AQUA) 32 MCG/ACT nasal spray; Place 1 spray into both  nostrils 2 (two) times daily.  Dispense: 8.3 mL; Refill: 0 - Guaifenesin (MUCINEX MAXIMUM STRENGTH) 1200 MG TB12; Take 1 tablet (1,200 mg total) by mouth every 12 (twelve) hours as needed.  Dispense: 14 tablet; Refill: 1  6. Nausea and vomiting, vomiting of unspecified type - ondansetron (ZOFRAN-ODT) 8 MG disintegrating tablet; Take 1 tablet (8  mg total) by mouth every 8 (eight) hours as needed for nausea.  Dispense: 30 tablet; Refill: 0  7. Allergic rhinitis, unspecified allergic rhinitis type - cetirizine (ZYRTEC) 10 MG tablet; Take 1 tablet (10 mg total) by mouth daily.  Dispense: 30 tablet; Refill: 5  Trena Platt, PA-C [facility] 06/05/2015 5:38 PM

## 2015-06-06 LAB — CBC WITH DIFFERENTIAL/PLATELET
BASOS PCT: 1 % (ref 0–1)
Basophils Absolute: 0.1 10*3/uL (ref 0.0–0.1)
Eosinophils Absolute: 0.1 10*3/uL (ref 0.0–0.7)
Eosinophils Relative: 2 % (ref 0–5)
HCT: 43.5 % (ref 36.0–46.0)
Hemoglobin: 15.4 g/dL — ABNORMAL HIGH (ref 12.0–15.0)
Lymphocytes Relative: 32 % (ref 12–46)
Lymphs Abs: 2.1 10*3/uL (ref 0.7–4.0)
MCH: 30.5 pg (ref 26.0–34.0)
MCHC: 35.4 g/dL (ref 30.0–36.0)
MCV: 86.1 fL (ref 78.0–100.0)
MPV: 10.8 fL (ref 8.6–12.4)
Monocytes Absolute: 0.3 10*3/uL (ref 0.1–1.0)
Monocytes Relative: 5 % (ref 3–12)
NEUTROS PCT: 60 % (ref 43–77)
Neutro Abs: 4 10*3/uL (ref 1.7–7.7)
Platelets: 225 10*3/uL (ref 150–400)
RBC: 5.05 MIL/uL (ref 3.87–5.11)
RDW: 13.7 % (ref 11.5–15.5)
WBC: 6.6 10*3/uL (ref 4.0–10.5)

## 2015-06-06 MED ORDER — NYSTATIN POWD: 5.0000 mL | Freq: Four times a day (QID) | Status: AC

## 2015-06-07 LAB — CULTURE, GROUP A STREP: Organism ID, Bacteria: NORMAL

## 2015-06-09 ENCOUNTER — Telehealth: Payer: Self-pay | Admitting: Family Medicine

## 2015-06-09 ENCOUNTER — Ambulatory Visit (INDEPENDENT_AMBULATORY_CARE_PROVIDER_SITE_OTHER): Payer: 59 | Admitting: Emergency Medicine

## 2015-06-09 ENCOUNTER — Ambulatory Visit
Admission: RE | Admit: 2015-06-09 | Discharge: 2015-06-09 | Disposition: A | Discharge status: Home / Self Care | Payer: 59 | Source: Ambulatory Visit | Attending: Emergency Medicine | Admitting: Emergency Medicine

## 2015-06-09 VITALS — BP 120/78 | HR 66 | Temp 98.3°F | Resp 18 | Ht 60.5 in | Wt 221.0 lb

## 2015-06-09 DIAGNOSIS — G5601 Carpal tunnel syndrome, right upper limb: Secondary | ICD-10-CM | POA: Diagnosis not present

## 2015-06-09 DIAGNOSIS — R269 Unspecified abnormalities of gait and mobility: Secondary | ICD-10-CM

## 2015-06-09 DIAGNOSIS — R42 Dizziness and giddiness: Secondary | ICD-10-CM

## 2015-06-09 NOTE — Progress Notes (Addendum)
   Subjective:  This chart was scribed for Earl LitesSteve Daub, MD by [facility], medical scribe at [facility].The patient was seen in exam room 09 and the patient's care was started at 12:09 PM.   Patient ID: Melissa [name], female    DOB: 03/09/1961, 54 y.o.   MRN: [id number] Chief Complaint  Patient presents with  . Hand Pain    states that 3 fingers on right hand keep locking   . Dizziness    started 3 days ago   HPI HPI Comments: Melissa [name] Munn is a 54 y.o. female who [facility] because her first three fingers on her right hand are locking up. She does have some weakness to her right hand. She has had surgery on her left hand.  Pt was seen her on 06/05/2015 by Dr. Katrinka BlazingSmith and her symptoms have persisted. She has allergies, a sore throat, nausea and sinus congestion.  Review of Systems  HENT: Positive for congestion, sinus pressure and sore throat.   Gastrointestinal: Positive for nausea.  Allergic/Immunologic: Positive for environmental allergies.  Psychiatric/Behavioral:       Patient has been very depressed recently. She is worried regarding the loss of sister year ago and there have been multiple changes at work which have been incredibly stressful for her      Objective:  BP 120/78 mmHg  Pulse 66  Temp(Src) 98.3 F (36.8 C) (Oral)  Resp 18  Ht 5' 0.5" (1.537 m)  Wt 221 lb (100.245 kg)  BMI 42.43 kg/m2  SpO2 98% Physical Exam  Vitals reviewed. CONSTITUTIONAL: Well developed/well nourished HEAD: Normocephalic/atraumatic EYES: EOMI/PERRL ENMT: Mucous membranes moist NECK: supple no meningeal signs SPINE/BACK:entire spine nontender CV: S1/S2 noted, no murmurs/rubs/gallops noted LUNGS: Lungs are clear to auscultation bilaterally, no apparent distress ABDOMEN: soft, nontender, no rebound or guarding, bowel sounds noted throughout abdomen GU:no cva tenderness NEURO: Pt is awake/alert/appropriate, moves all  extremitiesx4.  No facial droop.   EXTREMITIES: pulses normal/equal, full ROM, subjective numbness involving the thumb, index finger and middle finger of the right hand. SKIN: warm, color normal PSYCH: no abnormalities of mood noted, alert and oriented to situation    Assessment & Plan:  I do not think this represents a stenosing tenosynovitis involving her right hand. She is going through a depression over the loss of her sister year ago. She is under a lot of stress at work. She does have some dizziness which may be stress related. We'll proceed with a CT had to be sure there is no other cause for her dizziness.I personally performed the services described in this documentation, which was scribed in my presence. The recorded information has been reviewed and is accurate.  Earl LitesSteve Daub, MD

## 2015-06-09 NOTE — Telephone Encounter (Signed)
Per Dr. Cleta Albertsaub call patient with CT head results. CT indicated chronic changes, no acute changes, nothing to cause dizziness. Patient notified and voiced understanding. She also wanted to know what she can do about carpel tunnel/what happened today. She had brace on, and her hand clamped down so hard  It broke her nail. She was wanting to know what else she can do. She was also concerned because she is supposed to return to work on the 14th and  she cant hardly grasp or pick up anything. I told her I would be here in the am and I will call and see if they can get her in in the next day or 2. She verbally stated she was ok with that plan and if they were able to even get her in in the next 3 days she will do that also.

## 2015-06-09 NOTE — Patient Instructions (Signed)
[facility] today at 4:40. [address]

## 2015-06-10 NOTE — Telephone Encounter (Signed)
Crystal was able to get her scheduled with Dr. Luiz [facility]areGraves at [facility] Thursday 06/12/2015 at 10:00 am. She called and left message with appt details and I will try calling her back again later as well.

## 2015-06-10 NOTE — Telephone Encounter (Signed)
These talk to referrals are go ahead and call and see how quickly we can get  Melissa [name] in to see the orthopedic hand surgeon.

## 2015-06-10 NOTE — Telephone Encounter (Signed)
That would be fine 

## 2015-06-11 ENCOUNTER — Emergency Department (HOSPITAL_COMMUNITY): Payer: 59

## 2015-06-11 ENCOUNTER — Inpatient Hospital Stay (HOSPITAL_COMMUNITY)
Admission: EM | Admit: 2015-06-11 | Discharge: 2015-06-13 | DRG: 638 | Disposition: A | Discharge status: Home / Self Care | Payer: 59 | Attending: Internal Medicine | Admitting: Internal Medicine

## 2015-06-11 ENCOUNTER — Encounter (HOSPITAL_COMMUNITY): Payer: Self-pay | Admitting: Emergency Medicine

## 2015-06-11 DIAGNOSIS — Z881 Allergy status to other antibiotic agents status: Secondary | ICD-10-CM

## 2015-06-11 DIAGNOSIS — R5383 Other fatigue: Secondary | ICD-10-CM | POA: Diagnosis not present

## 2015-06-11 DIAGNOSIS — I129 Hypertensive chronic kidney disease with stage 1 through stage 4 chronic kidney disease, or unspecified chronic kidney disease: Secondary | ICD-10-CM | POA: Diagnosis present

## 2015-06-11 DIAGNOSIS — K7460 Unspecified cirrhosis of liver: Secondary | ICD-10-CM | POA: Diagnosis present

## 2015-06-11 DIAGNOSIS — K7581 Nonalcoholic steatohepatitis (NASH): Secondary | ICD-10-CM | POA: Diagnosis present

## 2015-06-11 DIAGNOSIS — I959 Hypotension, unspecified: Secondary | ICD-10-CM | POA: Diagnosis present

## 2015-06-11 DIAGNOSIS — Z885 Allergy status to narcotic agent status: Secondary | ICD-10-CM | POA: Diagnosis not present

## 2015-06-11 DIAGNOSIS — N179 Acute kidney failure, unspecified: Secondary | ICD-10-CM | POA: Diagnosis present

## 2015-06-11 DIAGNOSIS — Z882 Allergy status to sulfonamides status: Secondary | ICD-10-CM

## 2015-06-11 DIAGNOSIS — I10 Essential (primary) hypertension: Secondary | ICD-10-CM | POA: Diagnosis not present

## 2015-06-11 DIAGNOSIS — N183 Chronic kidney disease, stage 3 (moderate): Secondary | ICD-10-CM | POA: Diagnosis present

## 2015-06-11 DIAGNOSIS — E871 Hypo-osmolality and hyponatremia: Secondary | ICD-10-CM | POA: Diagnosis present

## 2015-06-11 DIAGNOSIS — K219 Gastro-esophageal reflux disease without esophagitis: Secondary | ICD-10-CM | POA: Diagnosis present

## 2015-06-11 DIAGNOSIS — D696 Thrombocytopenia, unspecified: Secondary | ICD-10-CM | POA: Diagnosis present

## 2015-06-11 DIAGNOSIS — Z9851 Tubal ligation status: Secondary | ICD-10-CM

## 2015-06-11 DIAGNOSIS — E669 Obesity, unspecified: Secondary | ICD-10-CM | POA: Diagnosis present

## 2015-06-11 DIAGNOSIS — Z8719 Personal history of other diseases of the digestive system: Secondary | ICD-10-CM

## 2015-06-11 DIAGNOSIS — E876 Hypokalemia: Secondary | ICD-10-CM | POA: Diagnosis present

## 2015-06-11 DIAGNOSIS — E1100 Type 2 diabetes mellitus with hyperosmolarity without nonketotic hyperglycemic-hyperosmolar coma (NKHHC): Secondary | ICD-10-CM | POA: Diagnosis not present

## 2015-06-11 DIAGNOSIS — E870 Hyperosmolality and hypernatremia: Secondary | ICD-10-CM | POA: Diagnosis present

## 2015-06-11 DIAGNOSIS — E139 Other specified diabetes mellitus without complications: Secondary | ICD-10-CM

## 2015-06-11 DIAGNOSIS — E1165 Type 2 diabetes mellitus with hyperglycemia: Principal | ICD-10-CM | POA: Diagnosis present

## 2015-06-11 DIAGNOSIS — Z90710 Acquired absence of both cervix and uterus: Secondary | ICD-10-CM | POA: Diagnosis not present

## 2015-06-11 DIAGNOSIS — Z880 Allergy status to penicillin: Secondary | ICD-10-CM

## 2015-06-11 DIAGNOSIS — Z6841 Body Mass Index (BMI) 40.0 and over, adult: Secondary | ICD-10-CM

## 2015-06-11 DIAGNOSIS — R739 Hyperglycemia, unspecified: Secondary | ICD-10-CM

## 2015-06-11 HISTORY — DX: Acute kidney failure, unspecified: N17.9

## 2015-06-11 HISTORY — DX: Chronic kidney disease, stage 3 (moderate): N18.3

## 2015-06-11 HISTORY — DX: Chronic kidney disease, stage 3 unspecified: N18.30

## 2015-06-11 HISTORY — DX: Type 2 diabetes mellitus without complications: E11.9

## 2015-06-11 LAB — HEPATIC FUNCTION PANEL
ALT: 39 U/L (ref 14–54)
AST: 58 U/L — AB (ref 15–41)
Albumin: 3.4 g/dL — ABNORMAL LOW (ref 3.5–5.0)
Alkaline Phosphatase: 176 U/L — ABNORMAL HIGH (ref 38–126)
BILIRUBIN TOTAL: 1.6 mg/dL — AB (ref 0.3–1.2)
Bilirubin, Direct: 0.5 mg/dL (ref 0.1–0.5)
Indirect Bilirubin: 1.1 mg/dL — ABNORMAL HIGH (ref 0.3–0.9)
Total Protein: 7.1 g/dL (ref 6.5–8.1)

## 2015-06-11 LAB — URINALYSIS, ROUTINE W REFLEX MICROSCOPIC
Bilirubin Urine: NEGATIVE
KETONES UR: NEGATIVE mg/dL
Leukocytes, UA: NEGATIVE
Nitrite: NEGATIVE
PH: 6 (ref 5.0–8.0)
Protein, ur: 30 mg/dL — AB
Specific Gravity, Urine: 1.031 — ABNORMAL HIGH (ref 1.005–1.030)
Urobilinogen, UA: 0.2 mg/dL (ref 0.0–1.0)

## 2015-06-11 LAB — CBC WITH DIFFERENTIAL/PLATELET
BASOS ABS: 0.1 10*3/uL (ref 0.0–0.1)
Basophils Relative: 1 % (ref 0–1)
Eosinophils Absolute: 0.1 10*3/uL (ref 0.0–0.7)
Eosinophils Relative: 1 % (ref 0–5)
HCT: 32.6 % — ABNORMAL LOW (ref 36.0–46.0)
HEMOGLOBIN: 12.6 g/dL (ref 12.0–15.0)
LYMPHS ABS: 1 10*3/uL (ref 0.7–4.0)
Lymphocytes Relative: 13 % (ref 12–46)
MCH: 30.9 pg (ref 26.0–34.0)
MCHC: 37.7 g/dL — ABNORMAL HIGH (ref 30.0–36.0)
MCV: 79.9 fL (ref 78.0–100.0)
MONO ABS: 0.8 10*3/uL (ref 0.1–1.0)
Monocytes Relative: 10 % (ref 3–12)
NEUTROS PCT: 75 % (ref 43–77)
Neutro Abs: 5.6 10*3/uL (ref 1.7–7.7)
PLATELETS: 134 10*3/uL — AB (ref 150–400)
RBC: 4.08 MIL/uL (ref 3.87–5.11)
RDW: 13.1 % (ref 11.5–15.5)
WBC: 7.4 10*3/uL (ref 4.0–10.5)

## 2015-06-11 LAB — GLUCOSE, CAPILLARY
Glucose-Capillary: 283 mg/dL — ABNORMAL HIGH (ref 65–99)
Glucose-Capillary: 355 mg/dL — ABNORMAL HIGH (ref 65–99)
Glucose-Capillary: 454 mg/dL — ABNORMAL HIGH (ref 65–99)

## 2015-06-11 LAB — MAGNESIUM: MAGNESIUM: 2.2 mg/dL (ref 1.7–2.4)

## 2015-06-11 LAB — BASIC METABOLIC PANEL
Anion gap: 16 — ABNORMAL HIGH (ref 5–15)
BUN: 22 mg/dL — ABNORMAL HIGH (ref 6–20)
CALCIUM: 9.2 mg/dL (ref 8.9–10.3)
CO2: 27 mmol/L (ref 22–32)
Chloride: 82 mmol/L — ABNORMAL LOW (ref 101–111)
Creatinine, Ser: 2.39 mg/dL — ABNORMAL HIGH (ref 0.44–1.00)
GFR calc non Af Amer: 22 mL/min — ABNORMAL LOW (ref >60)
GFR, EST AFRICAN AMERICAN: 25 mL/min — AB (ref >60)
Glucose, Bld: 811 mg/dL (ref 65–99)
POTASSIUM: 2.9 mmol/L — AB (ref 3.5–5.1)
Sodium: 125 mmol/L — ABNORMAL LOW (ref 135–145)

## 2015-06-11 LAB — URINE MICROSCOPIC-ADD ON

## 2015-06-11 LAB — CBG MONITORING, ED
Glucose-Capillary: >600 mg/dL (ref 65–99)
Glucose-Capillary: 505 mg/dL — ABNORMAL HIGH (ref 65–99)
Glucose-Capillary: 468 mg/dL — ABNORMAL HIGH (ref 65–99)

## 2015-06-11 LAB — PROTIME-INR
INR: 1.19 (ref 0.00–1.49)
Prothrombin Time: 15.2 seconds (ref 11.6–15.2)

## 2015-06-11 LAB — AMMONIA: AMMONIA: 54 umol/L — AB (ref 9–35)

## 2015-06-11 MED ORDER — ACETAMINOPHEN 325 MG PO TABS: 650.0000 mg | ORAL_TABLET | Freq: Four times a day (QID) | ORAL | Status: DC | PRN

## 2015-06-11 MED ORDER — AMOXICILLIN-POT CLAVULANATE 500-125 MG PO TABS
1.0000 | ORAL_TABLET | Freq: Two times a day (BID) | ORAL | Status: DC
  Administered 2015-06-12 – 2015-06-13 (×4): 500 mg via ORAL
  Filled 2015-06-11 (×5): qty 1

## 2015-06-11 MED ORDER — POTASSIUM CHLORIDE CRYS ER 20 MEQ PO TBCR
40.0000 meq | EXTENDED_RELEASE_TABLET | Freq: Three times a day (TID) | ORAL | Status: AC
Start: 2015-06-11 — End: 2015-06-12
  Administered 2015-06-12 (×3): 40 meq via ORAL
  Filled 2015-06-11 (×4): qty 2

## 2015-06-11 MED ORDER — CYCLOBENZAPRINE HCL 10 MG PO TABS
10.0000 mg | ORAL_TABLET | Freq: Three times a day (TID) | ORAL | Status: DC | PRN
  Administered 2015-06-12: 10 mg via ORAL
  Filled 2015-06-11: qty 1

## 2015-06-11 MED ORDER — SODIUM CHLORIDE 0.9 % IV SOLN
1000.0000 mL | Freq: Once | INTRAVENOUS | Status: AC
  Administered 2015-06-11: 1000 mL via INTRAVENOUS

## 2015-06-11 MED ORDER — SODIUM CHLORIDE 0.9 % IJ SOLN
3.0000 mL | Freq: Two times a day (BID) | INTRAMUSCULAR | Status: DC
  Administered 2015-06-12 – 2015-06-13 (×2): 3 mL via INTRAVENOUS

## 2015-06-11 MED ORDER — FLUTICASONE PROPIONATE 50 MCG/ACT NA SUSP
1.0000 | Freq: Every day | NASAL | Status: DC
  Administered 2015-06-12 – 2015-06-13 (×2): 1 via NASAL
  Filled 2015-06-11: qty 16

## 2015-06-11 MED ORDER — NADOLOL 40 MG PO TABS
40.0000 mg | ORAL_TABLET | Freq: Every day | ORAL | Status: DC
Start: 2015-06-12 — End: 2015-06-12
  Filled 2015-06-11: qty 1

## 2015-06-11 MED ORDER — INSULIN REGULAR BOLUS VIA INFUSION
0.0000 [IU] | Freq: Three times a day (TID) | INTRAVENOUS | Status: DC
  Filled 2015-06-11: qty 10

## 2015-06-11 MED ORDER — SODIUM CHLORIDE 0.9 % IV SOLN: INTRAVENOUS | Status: DC

## 2015-06-11 MED ORDER — PANTOPRAZOLE SODIUM 40 MG PO TBEC
40.0000 mg | DELAYED_RELEASE_TABLET | Freq: Every day | ORAL | Status: DC
Start: 2015-06-12 — End: 2015-06-13
  Administered 2015-06-12 – 2015-06-13 (×2): 40 mg via ORAL
  Filled 2015-06-11: qty 1

## 2015-06-11 MED ORDER — ONDANSETRON HCL 4 MG PO TABS: 4.0000 mg | ORAL_TABLET | Freq: Four times a day (QID) | ORAL | Status: DC | PRN

## 2015-06-11 MED ORDER — ACETAMINOPHEN 650 MG RE SUPP: 650.0000 mg | Freq: Four times a day (QID) | RECTAL | Status: DC | PRN

## 2015-06-11 MED ORDER — ONDANSETRON HCL 4 MG/2ML IJ SOLN: 4.0000 mg | Freq: Four times a day (QID) | INTRAMUSCULAR | Status: DC | PRN

## 2015-06-11 MED ORDER — LORATADINE 10 MG PO TABS
10.0000 mg | ORAL_TABLET | Freq: Every day | ORAL | Status: DC
  Administered 2015-06-12 – 2015-06-13 (×2): 10 mg via ORAL
  Filled 2015-06-11 (×2): qty 1

## 2015-06-11 MED ORDER — NYSTATIN 100000 UNIT/ML MT SUSP
5.0000 mL | Freq: Three times a day (TID) | OROMUCOSAL | Status: DC
  Administered 2015-06-12 – 2015-06-13 (×3): 500000 [IU] via OROMUCOSAL
  Filled 2015-06-11 (×6): qty 5

## 2015-06-11 MED ORDER — SODIUM CHLORIDE 0.9 % IV SOLN
INTRAVENOUS | Status: DC
  Administered 2015-06-11: 5.4 [IU]/h via INTRAVENOUS
  Administered 2015-06-11: 4.5 [IU]/h via INTRAVENOUS
  Administered 2015-06-12: 0.9 [IU]/h via INTRAVENOUS
  Administered 2015-06-12: 0.7 [IU]/h via INTRAVENOUS
  Administered 2015-06-12: 8.4 [IU]/h via INTRAVENOUS
  Administered 2015-06-12: 8.9 [IU]/h via INTRAVENOUS
  Administered 2015-06-12: 2.4 [IU]/h via INTRAVENOUS
  Filled 2015-06-11: qty 2.5

## 2015-06-11 MED ORDER — DEXTROSE 50 % IV SOLN: 25.0000 mL | INTRAVENOUS | Status: DC | PRN

## 2015-06-11 MED ORDER — IPRATROPIUM BROMIDE 0.06 % NA SOLN
2.0000 | Freq: Three times a day (TID) | NASAL | Status: DC
  Administered 2015-06-12 – 2015-06-13 (×5): 2 via NASAL
  Filled 2015-06-11: qty 15

## 2015-06-11 MED ORDER — SODIUM CHLORIDE 0.9 % IV SOLN
1000.0000 mL | INTRAVENOUS | Status: DC
  Administered 2015-06-11 (×2): 1000 mL via INTRAVENOUS

## 2015-06-11 MED ORDER — POTASSIUM CHLORIDE CRYS ER 20 MEQ PO TBCR
40.0000 meq | EXTENDED_RELEASE_TABLET | Freq: Once | ORAL | Status: AC
  Administered 2015-06-11: 40 meq via ORAL
  Filled 2015-06-11: qty 2

## 2015-06-11 MED ORDER — DEXTROSE-NACL 5-0.45 % IV SOLN: INTRAVENOUS | Status: DC

## 2015-06-11 MED ORDER — ENSURE ENLIVE PO LIQD
237.0000 mL | Freq: Two times a day (BID) | ORAL | Status: DC
  Administered 2015-06-12 – 2015-06-13 (×2): 237 mL via ORAL

## 2015-06-11 MED ORDER — PNEUMOCOCCAL VAC POLYVALENT 25 MCG/0.5ML IJ INJ
0.5000 mL | INJECTION | INTRAMUSCULAR | Status: AC
  Administered 2015-06-12: 0.5 mL via INTRAMUSCULAR
  Filled 2015-06-11: qty 0.5

## 2015-06-11 NOTE — ED Provider Notes (Signed)
CSN: [id number]     Arrival date & time 06/11/15  1521 History   First MD Initiated Contact with Patient 06/11/15 1524     Chief Complaint  Patient presents with  . Spasms     (Consider location/radiation/quality/duration/timing/severity/associated sxs/prior Treatment) Patient is a 54 y.o. female presenting with weakness. The history is provided by the patient (the pt complains of muslce weakness in arms with cramping).  Weakness This is a new problem. The current episode started more than 2 days ago. The problem occurs constantly. The problem has not changed since onset.Pertinent negatives include no chest pain, no abdominal pain, no headaches and no shortness of breath. Nothing aggravates the symptoms. Nothing relieves the symptoms.    Past Medical History  Diagnosis Date  . Allergy     sinus allergies  . GERD (gastroesophageal reflux disease)   . Heart murmur   . Hypertension   . Esophageal varices    Past Surgical History  Procedure Laterality Date  . Abdominal hysterectomy  1997  . Tubal ligation  1981  . Cesarean section  1979  . Breast reduction surgery  2005  . Carpal tunnel release  1987    right hand   Family History  Problem Relation Age of Onset  . Colon cancer Neg Hx   . Esophageal cancer Neg Hx   . Rectal cancer Neg Hx   . Stomach cancer Neg Hx   . Hypertension Mother   . Hypertension Brother   . Kidney disease Brother     ESRD on Dialysis  . Diabetes Brother    History  Substance Use Topics  . Smoking status: Never Smoker   . Smokeless tobacco: Never Used  . Alcohol Use: No   OB History    No data available     Review of Systems  Constitutional: Negative for appetite change and fatigue.  HENT: Negative for congestion, ear discharge and sinus pressure.   Eyes: Negative for discharge.  Respiratory: Negative for cough and shortness of breath.   Cardiovascular: Negative for chest pain.  Gastrointestinal: Negative for abdominal pain and diarrhea.   Genitourinary: Negative for frequency and hematuria.  Musculoskeletal: Positive for myalgias. Negative for back pain.  Skin: Negative for rash.  Neurological: Positive for weakness. Negative for seizures and headaches.  Psychiatric/Behavioral: Negative for hallucinations.      Allergies  Codeine; Erythromycin; Ketek; Oxycodone-acetaminophen; Penicillins; Propoxyphene n-acetaminophen; Sulfonamide derivatives; and Vicodin  Home Medications   Prior to Admission medications   Medication Sig Start Date End Date Taking? Authorizing Provider  amoxicillin-clavulanate (AUGMENTIN) 500-125 MG per tablet Take 1 tablet (500 mg total) by mouth 3 (three) times daily. 06/05/15 06/15/15 Yes Stephanie D English, PA  benzonatate (TESSALON) 200 MG capsule Take 200 mg by mouth daily as needed. For coughing 03/13/15  Yes Historical Provider, MD  budesonide (RHINOCORT AQUA) 32 MCG/ACT nasal spray Place 1 spray into both nostrils 2 (two) times daily. 06/05/15  Yes Collie SiadStephanie D English, PA  cetirizine (ZYRTEC) 10 MG tablet Take 1 tablet (10 mg total) by mouth daily. 06/05/15  Yes Collie SiadStephanie D English, PA  cyclobenzaprine (FLEXERIL) 10 MG tablet TAKE 1 TABLET (10 MG TOTAL) BY MOUTH AT BEDTIME. 10/28/14  Yes Chelle Jeffery, PA-C  esomeprazole (NEXIUM) 40 MG capsule Take 1 capsule (40 mg total) by mouth 1 day or 1 dose. 09/29/14  Yes Chelle Jeffery, PA-C  Guaifenesin (MUCINEX MAXIMUM STRENGTH) 1200 MG TB12 Take 1 tablet (1,200 mg total) by mouth every 12 (twelve) hours as needed.  06/05/15  Yes Stephanie D English, PA  ipratropium (ATROVENT) 0.06 % nasal spray Place 2 sprays into both nostrils 4 (four) times daily. Patient taking differently: Place 2 sprays into both nostrils 3 (three) times daily.  03/13/15  Yes Rodolph Bong, MD  nadolol (CORGARD) 40 MG tablet TAKE 1 TABLET (40 MG TOTAL) BY MOUTH DAILY. 09/29/14  Yes Chelle Jeffery, PA-C  nystatin (MYCOSTATIN) 100000 UNIT/ML suspension Use as directed 5 mLs in the mouth or throat  4 (four) times daily.   Yes Historical Provider, MD  ondansetron (ZOFRAN-ODT) 8 MG disintegrating tablet Take 1 tablet (8 mg total) by mouth every 8 (eight) hours as needed for nausea. 06/05/15  Yes Stephanie D English, PA  triamterene-hydrochlorothiazide (MAXZIDE-25) 37.5-25 MG per tablet TAKE 1 TABLET BY MOUTH DAILY. 09/29/14  Yes Chelle Jeffery, PA-C  metoCLOPramide (REGLAN) 10 MG tablet Take 1 tablet 30 minutes before drinking 1st half of prep (day before procedure) and 30 minutes before drinking 2nd half of prep (day of procedure) 12/13/14   Meredith Pel, NP  montelukast (SINGULAIR) 10 MG tablet Take 1 tablet (10 mg total) by mouth at bedtime. 09/29/14   Chelle Jeffery, PA-C  Nystatin POWD 5 mLs by Does not apply route 4 (four) times daily. 5ml swish and swallow 06/06/15 06/20/15  Stephanie D English, PA   BP 104/67 mmHg  Pulse 65  Temp(Src) 98.7 F (37.1 C) (Oral)  Resp 20  Ht 5' 2.5" (1.588 m)  Wt 221 lb (100.245 kg)  BMI 39.75 kg/m2  SpO2 98% Physical Exam  Constitutional: She is oriented to person, place, and time. She appears well-developed.  HENT:  Head: Normocephalic.  Eyes: Conjunctivae and EOM are normal. No scleral icterus.  Neck: Neck supple. No thyromegaly present.  Cardiovascular: Normal rate and regular rhythm.  Exam reveals no gallop and no friction rub.   No murmur heard. Pulmonary/Chest: No stridor. She has no wheezes. She has no rales. She exhibits no tenderness.  Abdominal: She exhibits no distension. There is no tenderness. There is no rebound.  Musculoskeletal: Normal range of motion. She exhibits no edema.  Lymphadenopathy:    She has no cervical adenopathy.  Neurological: She is oriented to person, place, and time. No cranial nerve deficit. She exhibits normal muscle tone.  Mild weakness in upper extr  Skin: No rash noted. No erythema.  Psychiatric: She has a normal mood and affect. Her behavior is normal.    ED Course  Procedures (including critical care  time) Labs Review Labs Reviewed  BASIC METABOLIC PANEL - Abnormal; Notable for the following:    Sodium 125 (*)    Potassium 2.9 (*)    Chloride 82 (*)    Glucose, Bld 811 (*)    BUN 22 (*)    Creatinine, Ser 2.39 (*)    GFR calc non Af Amer 22 (*)    GFR calc Af Amer 25 (*)    Anion gap 16 (*)    All other components within normal limits  URINALYSIS, ROUTINE W REFLEX MICROSCOPIC (NOT AT [facility]) - Abnormal; Notable for the following:    APPearance HAZY (*)    Specific Gravity, Urine 1.031 (*)    Glucose, UA >1000 (*)    Hgb urine dipstick LARGE (*)    Protein, ur 30 (*)    All other components within normal limits  CBC WITH DIFFERENTIAL/PLATELET - Abnormal; Notable for the following:    HCT 32.6 (*)    MCHC 37.7 (*)  Platelets 134 (*)    All other components within normal limits  HEPATIC FUNCTION PANEL - Abnormal; Notable for the following:    Albumin 3.4 (*)    AST 58 (*)    Alkaline Phosphatase 176 (*)    Total Bilirubin 1.6 (*)    Indirect Bilirubin 1.1 (*)    All other components within normal limits  URINE MICROSCOPIC-ADD ON - Abnormal; Notable for the following:    Squamous Epithelial / LPF FEW (*)    Bacteria, UA FEW (*)    All other components within normal limits  CBG MONITORING, ED - Abnormal; Notable for the following:    Glucose-Capillary >600 (*)    All other components within normal limits  CBG MONITORING, ED - Abnormal; Notable for the following:    Glucose-Capillary 505 (*)    All other components within normal limits    Imaging Review Ct Head Wo Contrast  06/11/2015   CLINICAL DATA:  Weakness, bilateral arm spasms  EXAM: CT HEAD WITHOUT CONTRAST  TECHNIQUE: Contiguous axial images were obtained from the base of the skull through the vertex without intravenous contrast.  COMPARISON:  06/09/2015  FINDINGS: No skull fracture is noted. No intracranial hemorrhage, mass effect or midline shift. No acute cortical infarction. No mass lesion is noted on this  unenhanced scan. Stable mild cerebral atrophy. Moderate cerebellar atrophy is unchanged from prior exam. Stable mild periventricular chronic white matter disease. No mass lesion is noted on this unenhanced scan.  IMPRESSION: No acute intracranial abnormality. Stable cerebral and cerebellar atrophy.   Electronically Signed   By: Natasha Mead M.D.   On: 06/11/2015 16:15     EKG Interpretation None      MDM   Final diagnoses:  Diabetes 1.5, managed as type 2    New onset diabetes with bg over 800,  Causing muscle weakness and spasm.  Pt put on insulin drip and admitted    Bethann Berkshire, MD 06/11/15 1929

## 2015-06-11 NOTE — Consult Note (Signed)
Reason for Consult: Acute renal failure on chronic kidney disease stage III Referring Physician: Lana Fish M.D. [facility])  HPI:  54 year old African-American woman past medical history significant for hypertension, GERD and seasonal allergies who denies prior knowledge of having chronic kidney disease or history of autoimmune diseases. Appears to have a baseline creatinine of about 1.2 (chronic kidney disease stage III) and on labs done earlier today-creatinine noted to be elevated at 2.4 raising concerned because she also had hematuria, proteinuria and recent skin rash/pharyngitis.  She reports that she has not been well for about the last month having suffered a purpuric skin rash (for which skin biopsy was done by dermatologist-results not quite clear to her), recurrent episodes of pharyngitis-she required a course of corticosteroids for the skin rash and then antibiotics for pharyngitis that resulted in oral thrush. She has been dealing with hyperglycemia, polyuria and polydipsia with decreased appetite for food and occasional nausea but no vomiting/diarrhea. She denies any flank pain, fever or chills.  She was also taking ibuprofen 800 mg 3 times a day for at least 5-6 days prior to presentation for arthralgias/throat pain while continuing to take her Maxzide for hypertension. No recent intravenous contrast exposure. She does not remember what antibiotic she took.  Denies prior history of recurrent UTI, kidney stones, acute renal failure, intravenous drug use, blood transfusions. Denies any strong family history of renal disease.    09/29/2014  04/08/2015  06/11/2015   BUN (H)  Creatinine 1.20 (H) 1.20 (H) 2.39 (H)    Past Medical History  Diagnosis Date  . Allergy     sinus allergies  . GERD (gastroesophageal reflux disease)   . Heart murmur   . Hypertension   . Esophageal varices     Past Surgical History  Procedure Laterality Date  . Abdominal hysterectomy  1997  .  Tubal ligation  1981  . Cesarean section  1979  . Breast reduction surgery  2005  . Carpal tunnel release  1987    right hand    Family History  Problem Relation Age of Onset  . Colon cancer Neg Hx   . Esophageal cancer Neg Hx   . Rectal cancer Neg Hx   . Stomach cancer Neg Hx   . Hypertension Mother   . Hypertension Brother   . Kidney disease Brother     ESRD on Dialysis  . Diabetes Brother   . CAD Sister     Social History:  reports that she has never smoked. She has never used smokeless tobacco. She reports that she does not drink alcohol or use illicit drugs.  Allergies:  Allergies  Allergen Reactions  . Codeine Nausea And Vomiting  . Erythromycin Nausea And Vomiting  . Ketek [Telithromycin] Nausea And Vomiting  . Oxycodone-Acetaminophen Nausea And Vomiting  . Penicillins Nausea And Vomiting  . Propoxyphene N-Acetaminophen Nausea And Vomiting  . Sulfonamide Derivatives Nausea And Vomiting  . Vicodin [Hydrocodone-Acetaminophen] Nausea And Vomiting    Medications: Scheduled:   BMP Latest Ref Rng 06/11/2015 04/08/2015 09/29/2014  Glucose 65 - 99 mg/dL 130(QM) 578(I) 94  BUN 6 - 20 mg/dL 69(G) 18 19  Creatinine 0.44 - 1.00 mg/dL 2.95(M) 8.41(L) 2.44(W)  Sodium 135 - 145 mmol/L 125(L) 141 139  Potassium 3.5 - 5.1 mmol/L 2.9(L) 3.7 3.4(L)  Chloride 101 - 111 mmol/L 82(L) 105 105  CO2 22 - 32 mmol/L Calcium 8.9 - 10.3 mg/dL 9.2 9.5 9.3   CBC Latest Ref  Rng 06/11/2015 06/05/2015 04/08/2015  WBC 4.0 - 10.5 K/uL 7.4 6.6 4.7  Hemoglobin 12.0 - 15.0 g/dL 40.9 15.4(H) 13.4  Hematocrit 36.0 - 46.0 % 32.6(L) 43.5 38.4  Platelets 150 - 400 K/uL 134(L) 225 175     Ct Head Wo Contrast  06/11/2015   CLINICAL DATA:  Weakness, bilateral arm spasms  EXAM: CT HEAD WITHOUT CONTRAST  TECHNIQUE: Contiguous axial images were obtained from the base of the skull through the vertex without intravenous contrast.  COMPARISON:  06/09/2015  FINDINGS: No skull fracture is noted. No  intracranial hemorrhage, mass effect or midline shift. No acute cortical infarction. No mass lesion is noted on this unenhanced scan. Stable mild cerebral atrophy. Moderate cerebellar atrophy is unchanged from prior exam. Stable mild periventricular chronic white matter disease. No mass lesion is noted on this unenhanced scan.  IMPRESSION: No acute intracranial abnormality. Stable cerebral and cerebellar atrophy.   Electronically Signed   By: Natasha Mead M.D.   On: 06/11/2015 16:15    Review of Systems  Constitutional: Positive for weight loss and malaise/fatigue. Negative for fever and chills.  HENT: Positive for congestion and sore throat. Negative for nosebleeds and tinnitus.   Eyes: Positive for blurred vision. Negative for double vision, photophobia and pain.  Respiratory: Negative.   Cardiovascular: Negative.   Gastrointestinal: Positive for nausea. Negative for vomiting, abdominal pain and diarrhea.  Genitourinary: Positive for frequency. Negative for dysuria, urgency, hematuria and flank pain.  Musculoskeletal: Positive for myalgias and joint pain.  Skin: Positive for rash.  Neurological: Positive for weakness. Negative for dizziness, tingling and sensory change.  Endo/Heme/Allergies: Positive for polydipsia. Does not bruise/bleed easily.   Blood pressure 98/58, pulse 67, temperature 98.7 F (37.1 C), temperature source Oral, resp. rate 24, height 5' 2.4" (1.585 m), weight 101.515 kg (223 lb 12.8 oz), SpO2 100 %. Physical Exam  Nursing note and vitals reviewed. Constitutional: She is oriented to person, place, and time. She appears well-developed and well-nourished. No distress.  HENT:  Head: Normocephalic and atraumatic.  Mouth/Throat: Oropharynx is clear and moist.  Eyes: Conjunctivae and EOM are normal. Pupils are equal, round, and reactive to light. No scleral icterus.  Neck: Normal range of motion. Neck supple. No JVD present.  Cardiovascular: Normal rate, regular rhythm and  normal heart sounds.  Exam reveals no friction rub.   No murmur heard. Respiratory: Effort normal and breath sounds normal. She has no wheezes. She has no rales.  GI: Soft. Bowel sounds are normal. There is no tenderness. There is no rebound.  Musculoskeletal: She exhibits no edema.  Trace ankle edema  Neurological: She is oriented to person, place, and time.  Appears to be somewhat confused with repetitive speech and unable to answer questions directly  Skin: Skin is warm and dry. Rash noted. No erythema.  Evolving rash-some hyperpigmented areas over the lower extremities noted. An erythematous focal area of nonblanching purpura noted of her left hand dorsum    Assessment/Plan: 1. Acute renal failure on chronic kidney disease stage III: Concerned raised with hematuria and proteinuria on her urinalysis in conjunction with an elevated creatinine as being reflective of an acute glomerulonephritis process. I suspect that her acute renal failure/history/associated database points more towards new onset diabetes with volume depletion from her polyuria/poor oral intake in the face of ongoing diuretic (Maxide) and non-steroidal anti-inflammatory drug use. Given the potential for associated glomerulonephritis particularly with a history of skin rash/hematuria-I agree with checking an antinuclear antibody, screening for vasculitis with  ANCA, check complement levels and quantify urine protein/creatinine ratio. Anti-double-stranded DNA should also be checked together with renal ultrasound. Recommend strict input/output monitoring, stringent glycemic control, support of relative hypotension and avoidance of nephrotoxic including NSAIDs/iodinated intravenous contrast. 2. Hyponatremia: Appears to be primarily pseudohyponatremia from concomitant hyperglycemia but she might also have some free water excretion defect in the face of acute renal failure/recent thiazide use. Monitor with correction of  hyperglycemia/diabetes. 3. Hypokalemia: Concerning that she is hypokalemic with such profound hyperglycemia-suspect that she has total body potassium deficit from polyuria/potassium loss in urine/thiazide use. I will check a magnesium level 4. New-onset diabetes: Management per primary service  Sevin Langenbach K. 06/11/2015, 9:45 PM

## 2015-06-11 NOTE — ED Notes (Signed)
MD at bedside. 

## 2015-06-11 NOTE — H&P (Signed)
PCP: JEFFERY,CHELLE, PA-C  At [facility]   Referring provider Zam[place]   Chief Complaint:  fatigue  HPI: Melissa [name] is a 54 y.o. female   has a past medical history of Allergy; GERD (gastroesophageal reflux disease); Heart murmur; Hypertension; and Esophageal varices.   Presented with  Worsening fatigue blurry vision increase urination. She reports arm twitching and spasms now reports some soreness in the muscles of the arms and legs. This started last night. Patient was afraid she was having a seizure but no LOC. Patient was taken extra ibuprofen to help with the discomfort. She had a CT scan done X 2 showing no acute abnormalities. Per PCP thought that twitching of her hand was due to Tenosynovitis.   Patient presented to emergency department was thought to have glucose of 811. He was given 2 L of normal saline and started some IV fluids and insulin drip. Blood glucose now down to 505  She was noted to have Cr. 2.39 and large amount of RBC in urine. Denies any fever or chills.   Patient reports hx of "purpura" was treated with steroids. Thought to be due to viral illness.  She had a biopsy done by Dr. Mindi Junker.   Her Sed Rate was noted to be elevated to 45. She had sore throat tested negative for strep and was diagnosed with thrush was treated by Nystatin and now improved. She has recurrent nasal conjestion and have beeen put on Augmentin to treat chronic sinusitis.   Patient was drinking large amounts of fluids mainly Soda and Cranberry juice.   Of note patient has hx of Idiopathic cirrhosis. Followed by Dr. Arlyce Dice, she has been seen by Dr. Katrinka Blazing at [facility].      Hospitalist was called for admission for new onset diabetes. And ARF   Review of Systems:    Pertinent positives include: fatigue,  weight loss, Sore throat, rash    lesions  Constitutional:  No weight loss, night sweats, Fevers, chills,   HEENT:  No headaches, Difficulty swallowing,Tooth/dental problems, No  sneezing, itching, ear ache, nasal congestion, post nasal drip,  Cardio-vascular:  No chest pain, Orthopnea, PND, anasarca, dizziness, palpitations.no Bilateral lower extremity swelling  GI:  No heartburn, indigestion, abdominal pain, nausea, vomiting, diarrhea, change in bowel habits, loss of appetite, melena, blood in stool, hematemesis Resp:  no shortness of breath at rest. No dyspnea on exertion, No excess mucus, no productive cough, No non-productive cough, No coughing up of blood.No change in color of mucus.No wheezing. Skin:  no. No jaundice GU:  no dysuria, change in color of urine, no urgency or frequency. No straining to urinate.  No flank pain.  Musculoskeletal:  No joint pain or no joint swelling. No decreased range of motion. No back pain.  Psych:  No change in mood or affect. No depression or anxiety. No memory loss.  Neuro: no localizing neurological complaints, no tingling, no weakness, no double vision, no gait abnormality, no slurred speech, no confusion  Otherwise ROS are negative except for above, 10 systems were reviewed  Past Medical History: Past Medical History  Diagnosis Date  . Allergy     sinus allergies  . GERD (gastroesophageal reflux disease)   . Heart murmur   . Hypertension   . Esophageal varices    Past Surgical History  Procedure Laterality Date  . Abdominal hysterectomy  1997  . Tubal ligation  1981  . Cesarean section  1979  . Breast reduction surgery  2005  . Carpal  tunnel release  1987    right hand     Medications: Prior to Admission medications   Medication Sig Start Date End Date Taking? Authorizing Provider  amoxicillin-clavulanate (AUGMENTIN) 500-125 MG per tablet Take 1 tablet (500 mg total) by mouth 3 (three) times daily. 06/05/15 06/15/15 Yes Stephanie D English, PA  benzonatate (TESSALON) 200 MG capsule Take 200 mg by mouth daily as needed. For coughing 03/13/15  Yes Historical Provider, MD  budesonide (RHINOCORT AQUA) 32  MCG/ACT nasal spray Place 1 spray into both nostrils 2 (two) times daily. 06/05/15  Yes Collie Siad English, PA  cetirizine (ZYRTEC) 10 MG tablet Take 1 tablet (10 mg total) by mouth daily. 06/05/15  Yes Collie Siad English, PA  cyclobenzaprine (FLEXERIL) 10 MG tablet TAKE 1 TABLET (10 MG TOTAL) BY MOUTH AT BEDTIME. 10/28/14  Yes Chelle Jeffery, PA-C  esomeprazole (NEXIUM) 40 MG capsule Take 1 capsule (40 mg total) by mouth 1 day or 1 dose. 09/29/14  Yes Chelle Jeffery, PA-C  Guaifenesin (MUCINEX MAXIMUM STRENGTH) 1200 MG TB12 Take 1 tablet (1,200 mg total) by mouth every 12 (twelve) hours as needed. 06/05/15  Yes Stephanie D English, PA  ipratropium (ATROVENT) 0.06 % nasal spray Place 2 sprays into both nostrils 4 (four) times daily. Patient taking differently: Place 2 sprays into both nostrils 3 (three) times daily.  03/13/15  Yes Rodolph Bong, MD  nadolol (CORGARD) 40 MG tablet TAKE 1 TABLET (40 MG TOTAL) BY MOUTH DAILY. 09/29/14  Yes Chelle Jeffery, PA-C  nystatin (MYCOSTATIN) 100000 UNIT/ML suspension Use as directed 5 mLs in the mouth or throat 4 (four) times daily.   Yes Historical Provider, MD  ondansetron (ZOFRAN-ODT) 8 MG disintegrating tablet Take 1 tablet (8 mg total) by mouth every 8 (eight) hours as needed for nausea. 06/05/15  Yes Stephanie D English, PA  triamterene-hydrochlorothiazide (MAXZIDE-25) 37.5-25 MG per tablet TAKE 1 TABLET BY MOUTH DAILY. 09/29/14  Yes Chelle Jeffery, PA-C  metoCLOPramide (REGLAN) 10 MG tablet Take 1 tablet 30 minutes before drinking 1st half of prep (day before procedure) and 30 minutes before drinking 2nd half of prep (day of procedure) 12/13/14   Meredith Pel, NP  montelukast (SINGULAIR) 10 MG tablet Take 1 tablet (10 mg total) by mouth at bedtime. 09/29/14   Chelle Jeffery, PA-C  Nystatin POWD 5 mLs by Does not apply route 4 (four) times daily. 5ml swish and swallow 06/06/15 06/20/15  Garnetta Buddy, PA    Allergies:   Allergies  Allergen Reactions  .  Codeine Nausea And Vomiting  . Erythromycin Nausea And Vomiting  . Ketek [Telithromycin] Nausea And Vomiting  . Oxycodone-Acetaminophen Nausea And Vomiting  . Penicillins Nausea And Vomiting  . Propoxyphene N-Acetaminophen Nausea And Vomiting  . Sulfonamide Derivatives Nausea And Vomiting  . Vicodin [Hydrocodone-Acetaminophen] Nausea And Vomiting    Social History:  Ambulatory independently Lives at home  With family    reports that she has never smoked. She has never used smokeless tobacco. She reports that she does not drink alcohol or use illicit drugs.    Family History: family history includes Diabetes in her brother; Hypertension in her brother and mother; Kidney disease in her brother. There is no history of Colon cancer, Esophageal cancer, Rectal cancer, or Stomach cancer.    Physical Exam: Patient Vitals for the past 24 hrs:  BP Temp Temp src Pulse Resp SpO2 Height Weight  06/11/15 1915 104/67 mmHg - - 65 20 98 % - -  06/11/15 1900  106/69 mmHg - - 65 23 99 % - -  06/11/15 1845 106/63 mmHg - - 67 19 99 % - -  06/11/15 1830 106/66 mmHg - - 71 20 99 % - -  06/11/15 1818 - - - - - - 5' 2.5" (1.588 m) 100.245 kg (221 lb)  06/11/15 1815 112/66 mmHg - - - 23 - - -  06/11/15 1745 112/59 mmHg - - (!) 159 19 98 % - -  06/11/15 1733 109/65 mmHg - - 64 19 95 % - -  06/11/15 1700 105/72 mmHg - - 68 18 99 % - -  06/11/15 1645 104/68 mmHg - - - 18 - - -  06/11/15 1630 107/70 mmHg - - - 24 - - -  06/11/15 1545 100/68 mmHg - - 64 16 96 % - -  06/11/15 1532 131/99 mmHg 98.7 F (37.1 C) Oral 63 18 99 % - -  06/11/15 1522 - - - - - 96 % - -    1. General:  in No Acute distress 2. Psychological: Alert and Oriented 3. Head/ENT:  Dry Mucous Membranes                          Head Non traumatic, neck supple                          Normal   Dentition 4. SKIN: decreased Skin turgor,  Skin clean Dry and intact, areas of subcutaneous nodularity and areas of purpura 5. Heart: Regular  rate and rhythm no Murmur, Rub or gallop 6. Lungs: Clear to auscultation bilaterally, no wheezes or crackles   7. Abdomen: Soft, non-tender, Non distended 8. Lower extremities: no clubbing, cyanosis, or edema 9. Neurologically Grossly intact, moving all 4 extremities equally 10. MSK: Normal range of motion  body mass index is 39.75 kg/(m^2).   Labs on Admission:   Results for orders placed or performed during the hospital encounter of 06/11/15 (from the past 24 hour(s))  Basic metabolic panel     Status: Abnormal   Collection Time: 06/11/15  3:40 PM  Result Value Ref Range   Sodium 125 (L) 135 - 145 mmol/L   Potassium 2.9 (L) 3.5 - 5.1 mmol/L   Chloride 82 (L) 101 - 111 mmol/L   CO2 27 22 - 32 mmol/L   Glucose, Bld 811 (HH) 65 - 99 mg/dL   BUN 22 (H) 6 - 20 mg/dL   Creatinine, Ser 2.95 (H) 0.44 - 1.00 mg/dL   Calcium 9.2 8.9 - 62.1 mg/dL   GFR calc non Af Amer 22 (L) >60 mL/min   GFR calc Af Amer 25 (L) >60 mL/min   Anion gap 16 (H) 5 - 15  CBC with Differential/Platelet     Status: Abnormal   Collection Time: 06/11/15  3:40 PM  Result Value Ref Range   WBC 7.4 4.0 - 10.5 K/uL   RBC 4.08 3.87 - 5.11 MIL/uL   Hemoglobin 12.6 12.0 - 15.0 g/dL   HCT 30.8 (L) 65.7 - 84.6 %   MCV 79.9 78.0 - 100.0 fL   MCH 30.9 26.0 - 34.0 pg   MCHC 37.7 (H) 30.0 - 36.0 g/dL   RDW 96.2 95.2 - 84.1 %   Platelets 134 (L) 150 - 400 K/uL   Neutrophils Relative % 75 43 - 77 %   Neutro Abs 5.6 1.7 - 7.7 K/uL   Lymphocytes Relative 13  12 - 46 %   Lymphs Abs 1.0 0.7 - 4.0 K/uL   Monocytes Relative 10 3 - 12 %   Monocytes Absolute 0.8 0.1 - 1.0 K/uL   Eosinophils Relative 1 0 - 5 %   Eosinophils Absolute 0.1 0.0 - 0.7 K/uL   Basophils Relative 1 0 - 1 %   Basophils Absolute 0.1 0.0 - 0.1 K/uL  Hepatic function panel     Status: Abnormal   Collection Time: 06/11/15  3:40 PM  Result Value Ref Range   Total Protein 7.1 6.5 - 8.1 g/dL   Albumin 3.4 (L) 3.5 - 5.0 g/dL   AST 58 (H) 15 - 41 U/L    ALT 39 14 - 54 U/L   Alkaline Phosphatase 176 (H) 38 - 126 U/L   Total Bilirubin 1.6 (H) 0.3 - 1.2 mg/dL   Bilirubin, Direct 0.5 0.1 - 0.5 mg/dL   Indirect Bilirubin 1.1 (H) 0.3 - 0.9 mg/dL  Urinalysis, Routine w reflex microscopic ([facility])     Status: Abnormal   Collection Time: 06/11/15  6:02 PM  Result Value Ref Range   Color, Urine YELLOW YELLOW   APPearance HAZY (A) CLEAR   Specific Gravity, Urine 1.031 (H) 1.005 - 1.030   pH 6.0 5.0 - 8.0   Glucose, UA >1000 (A) NEGATIVE mg/dL   Hgb urine dipstick LARGE (A) NEGATIVE   Bilirubin Urine NEGATIVE NEGATIVE   Ketones, ur NEGATIVE NEGATIVE mg/dL   Protein, ur 30 (A) NEGATIVE mg/dL   Urobilinogen, UA 0.2 0.0 - 1.0 mg/dL   Nitrite NEGATIVE NEGATIVE   Leukocytes, UA NEGATIVE NEGATIVE  Urine microscopic-add on     Status: Abnormal   Collection Time: 06/11/15  6:02 PM  Result Value Ref Range   Squamous Epithelial / LPF FEW (A) RARE   WBC, UA 0-2 <3 WBC/hpf   RBC / HPF TOO NUMEROUS TO COUNT <3 RBC/hpf   Bacteria, UA FEW (A) RARE   Urine-Other FEW YEAST   CBG monitoring, ED     Status: Abnormal   Collection Time: 06/11/15  6:09 PM  Result Value Ref Range   Glucose-Capillary >600 (HH) 65 - 99 mg/dL  POC CBG, ED     Status: Abnormal   Collection Time: 06/11/15  7:20 PM  Result Value Ref Range   Glucose-Capillary 505 (H) 65 - 99 mg/dL    UAincrease glucose and evidence of RBCs in the urine  Lab Results  Component Value Date   HGBA1C 5.5 12/28/2013    Estimated Creatinine Clearance: 29.4 mL/min (by C-G formula based on Cr of 2.39).  BNP (last 3 results) No results for input(s): PROBNP in the last 8760 hours.  Other results:  I have pearsonaly reviewed this: ECG REPORnot obtained    Filed Weights   06/11/15 1818  Weight: 100.245 kg (221 lb)     Cultures: No results found for: SDES, SPECREQUEST, CULT, REPTSTATUS   Radiological Exams on Admission: Ct Head Wo Contrast  06/11/2015   CLINICAL DATA:  Weakness,  bilateral arm spasms  EXAM: CT HEAD WITHOUT CONTRAST  TECHNIQUE: Contiguous axial images were obtained from the base of the skull through the vertex without intravenous contrast.  COMPARISON:  06/09/2015  FINDINGS: No skull fracture is noted. No intracranial hemorrhage, mass effect or midline shift. No acute cortical infarction. No mass lesion is noted on this unenhanced scan. Stable mild cerebral atrophy. Moderate cerebellar atrophy is unchanged from prior exam. Stable mild periventricular chronic white matter disease. No  mass lesion is noted on this unenhanced scan.  IMPRESSION: No acute intracranial abnormality. Stable cerebral and cerebellar atrophy.   Electronically Signed   By: Natasha Mead M.D.   On: 06/11/2015 16:15    Chart has been reviewed  Family not  at  Bedside    Assessment/Plan  54 year old female with history of hypertension being admitted for hyperglycemia likely secondary to new onset of diabetes mellitus type 2, acute renal failure in the setting of recent purpuric rash will need to be evaluated for rheumatological abnormalities. Nephrology has been consulted  Present on Admission:  . DM (diabetes mellitus), type 2 with hyperosmolarity - likely new diagnosis. Steroid-induced. We'll check hemoglobin A1c. Currently on glucose stabilizer. We'll need diabetic education consult. May need to be discharged on insulin subcutaneous. Given acute renal failure probably not a good candidate for metformin  . Hypertension - hold Maxzide continue Nadolol . Acute renal failure - appreciate nephrology input, extensive rheumatological workup ordered, being worked up for glomerular nephritis,  avoid NSAIDS . Hyperglycemia - glucose stabilizer currently improving  . Idiopathic cirrhosis - stable continue to monitor ammonia level 54 patient appears to be alert and oriented only mildly elevated INR within a normal limits   Prophylaxis:  SCD  CODE STATUS:  FULL CODE as per patient    Disposition:   To home once workup is complete and patient is stable  Other plan as per orders.  I have spent a total of 65 min on this admission sputum was taken to discuss case with nephrology dr. Dionne Ano 06/11/2015, 7:48 PM  [facility]  Pager [phone]   after 2 AM please page floor coverage PA If 7AM-7PM, please contact the day team taking care of the patient  [email or web address]  Password [email or web address]

## 2015-06-11 NOTE — ED Notes (Signed)
Pt from home with c/o bilateral arm spasms, "twitching," and right foot twitching with weakness.  When walking pt dragged right foot with EMS.  Pt hands are clinched with equal grips.  Pt and spouse report slurred speech and the foot drop x 3-4 days.  Pt reports increasing her dose of ibuprofen recently for carpel tunnel pain with no relief.  Denies pain elsewhere, SOB, N/V/D.  Pt in NAD, A&O.

## 2015-06-11 NOTE — Progress Notes (Signed)
  Pt admitted to the unit. Pt is stable, alert and oriented per baseline. Oriented to room, staff, and call bell. Educated to call for any assistance. Bed in lowest position, call bell within reach- will continue to monitor. 

## 2015-06-12 ENCOUNTER — Inpatient Hospital Stay (HOSPITAL_COMMUNITY): Payer: 59

## 2015-06-12 DIAGNOSIS — K7460 Unspecified cirrhosis of liver: Secondary | ICD-10-CM

## 2015-06-12 DIAGNOSIS — E1100 Type 2 diabetes mellitus with hyperosmolarity without nonketotic hyperglycemic-hyperosmolar coma (NKHHC): Secondary | ICD-10-CM

## 2015-06-12 DIAGNOSIS — I10 Essential (primary) hypertension: Secondary | ICD-10-CM

## 2015-06-12 DIAGNOSIS — N179 Acute kidney failure, unspecified: Secondary | ICD-10-CM

## 2015-06-12 LAB — PROTEIN / CREATININE RATIO, URINE
CREATININE, URINE: 88.7 mg/dL
Protein Creatinine Ratio: 0.56 mg/mg{Cre} — ABNORMAL HIGH (ref 0.00–0.15)
TOTAL PROTEIN, URINE: 50 mg/dL

## 2015-06-12 LAB — GLUCOSE, CAPILLARY
GLUCOSE-CAPILLARY: 559 mg/dL — AB (ref 65–99)
Glucose-Capillary: 227 mg/dL — ABNORMAL HIGH (ref 65–99)
Glucose-Capillary: 156 mg/dL — ABNORMAL HIGH (ref 65–99)
Glucose-Capillary: 121 mg/dL — ABNORMAL HIGH (ref 65–99)
Glucose-Capillary: 117 mg/dL — ABNORMAL HIGH (ref 65–99)
Glucose-Capillary: 107 mg/dL — ABNORMAL HIGH (ref 65–99)
Glucose-Capillary: 126 mg/dL — ABNORMAL HIGH (ref 65–99)
Glucose-Capillary: 134 mg/dL — ABNORMAL HIGH (ref 65–99)
Glucose-Capillary: 384 mg/dL — ABNORMAL HIGH (ref 65–99)

## 2015-06-12 LAB — CBC
HCT: 35.9 % — ABNORMAL LOW (ref 36.0–46.0)
Hemoglobin: 13.3 g/dL (ref 12.0–15.0)
MCH: 30.7 pg (ref 26.0–34.0)
MCHC: 37 g/dL — ABNORMAL HIGH (ref 30.0–36.0)
MCV: 82.9 fL (ref 78.0–100.0)
Platelets: 136 10*3/uL — ABNORMAL LOW (ref 150–400)
RBC: 4.33 MIL/uL (ref 3.87–5.11)
RDW: 13.6 % (ref 11.5–15.5)
WBC: 7.2 10*3/uL (ref 4.0–10.5)

## 2015-06-12 LAB — COMPREHENSIVE METABOLIC PANEL
ALK PHOS: 116 U/L (ref 38–126)
ALT: 31 U/L (ref 14–54)
AST: 54 U/L — AB (ref 15–41)
Albumin: 2.8 g/dL — ABNORMAL LOW (ref 3.5–5.0)
Anion gap: 8 (ref 5–15)
BILIRUBIN TOTAL: 1.2 mg/dL (ref 0.3–1.2)
BUN: 14 mg/dL (ref 6–20)
CHLORIDE: 104 mmol/L (ref 101–111)
CO2: 27 mmol/L (ref 22–32)
Calcium: 8.4 mg/dL — ABNORMAL LOW (ref 8.9–10.3)
Creatinine, Ser: 1.57 mg/dL — ABNORMAL HIGH (ref 0.44–1.00)
GFR calc non Af Amer: 36 mL/min — ABNORMAL LOW (ref >60)
GFR, EST AFRICAN AMERICAN: 41 mL/min — AB (ref >60)
Glucose, Bld: 111 mg/dL — ABNORMAL HIGH (ref 65–99)
Potassium: 2.5 mmol/L — CL (ref 3.5–5.1)
SODIUM: 139 mmol/L (ref 135–145)
Total Protein: 6.1 g/dL — ABNORMAL LOW (ref 6.5–8.1)

## 2015-06-12 LAB — MAGNESIUM
MAGNESIUM: 2 mg/dL (ref 1.7–2.4)
Magnesium: 1.9 mg/dL (ref 1.7–2.4)

## 2015-06-12 LAB — RENAL FUNCTION PANEL
ANION GAP: 9 (ref 5–15)
Albumin: 2.8 g/dL — ABNORMAL LOW (ref 3.5–5.0)
BUN: 16 mg/dL (ref 6–20)
CO2: 26 mmol/L (ref 22–32)
Calcium: 8.1 mg/dL — ABNORMAL LOW (ref 8.9–10.3)
Chloride: 101 mmol/L (ref 101–111)
Creatinine, Ser: 1.72 mg/dL — ABNORMAL HIGH (ref 0.44–1.00)
GFR calc non Af Amer: 32 mL/min — ABNORMAL LOW (ref >60)
GFR, EST AFRICAN AMERICAN: 37 mL/min — AB (ref >60)
Glucose, Bld: 111 mg/dL — ABNORMAL HIGH (ref 65–99)
Phosphorus: <1 mg/dL — CL (ref 2.5–4.6)
Potassium: 2.5 mmol/L — CL (ref 3.5–5.1)
Sodium: 136 mmol/L (ref 135–145)

## 2015-06-12 LAB — PHOSPHORUS

## 2015-06-12 LAB — TSH: TSH: 1.518 u[IU]/mL (ref 0.350–4.500)

## 2015-06-12 LAB — POTASSIUM: Potassium: 3.7 mmol/L (ref 3.5–5.1)

## 2015-06-12 MED ORDER — POTASSIUM PHOSPHATES 15 MMOLE/5ML IV SOLN
30.0000 meq | Freq: Once | INTRAVENOUS | Status: AC
  Administered 2015-06-12: 30 meq via INTRAVENOUS
  Filled 2015-06-12: qty 6.82

## 2015-06-12 MED ORDER — LIVING WELL WITH DIABETES BOOK
Freq: Once | Status: AC
  Administered 2015-06-12: 11:00:00
  Filled 2015-06-12: qty 1

## 2015-06-12 MED ORDER — HEPARIN SODIUM (PORCINE) 5000 UNIT/ML IJ SOLN
5000.0000 [IU] | Freq: Three times a day (TID) | INTRAMUSCULAR | Status: DC
  Administered 2015-06-12 – 2015-06-13 (×3): 5000 [IU] via SUBCUTANEOUS
  Filled 2015-06-12 (×4): qty 1

## 2015-06-12 MED ORDER — INSULIN GLARGINE 100 UNIT/ML ~~LOC~~ SOLN
10.0000 [IU] | Freq: Every day | SUBCUTANEOUS | Status: DC
  Administered 2015-06-12: 10 [IU] via SUBCUTANEOUS
  Filled 2015-06-12: qty 0.1

## 2015-06-12 MED ORDER — POTASSIUM CHLORIDE 10 MEQ/100ML IV SOLN: 10.0000 meq | INTRAVENOUS | Status: DC

## 2015-06-12 MED ORDER — INSULIN STARTER KIT- PEN NEEDLES (ENGLISH)
1.0000 | Freq: Once | Status: DC
  Filled 2015-06-12 (×2): qty 1

## 2015-06-12 MED ORDER — INSULIN ASPART 100 UNIT/ML ~~LOC~~ SOLN
0.0000 [IU] | Freq: Three times a day (TID) | SUBCUTANEOUS | Status: DC
  Administered 2015-06-12: 20 [IU] via SUBCUTANEOUS
  Administered 2015-06-12: 3 [IU] via SUBCUTANEOUS

## 2015-06-12 MED ORDER — POTASSIUM CHLORIDE 10 MEQ/100ML IV SOLN
10.0000 meq | INTRAVENOUS | Status: AC
  Administered 2015-06-12 (×4): 10 meq via INTRAVENOUS
  Filled 2015-06-12 (×4): qty 100

## 2015-06-12 MED ORDER — INSULIN ASPART 100 UNIT/ML ~~LOC~~ SOLN: 0.0000 [IU] | Freq: Every day | SUBCUTANEOUS | Status: DC

## 2015-06-12 MED ORDER — INSULIN ASPART 100 UNIT/ML ~~LOC~~ SOLN
0.0000 [IU] | Freq: Three times a day (TID) | SUBCUTANEOUS | Status: DC
  Administered 2015-06-13: 8 [IU] via SUBCUTANEOUS
  Administered 2015-06-13: 15 [IU] via SUBCUTANEOUS

## 2015-06-12 MED ORDER — INSULIN GLARGINE 100 UNIT/ML ~~LOC~~ SOLN
10.0000 [IU] | Freq: Two times a day (BID) | SUBCUTANEOUS | Status: DC
  Filled 2015-06-12: qty 0.1

## 2015-06-12 MED ORDER — INSULIN ASPART 100 UNIT/ML ~~LOC~~ SOLN
20.0000 [IU] | Freq: Once | SUBCUTANEOUS | Status: AC
  Administered 2015-06-12: 20 [IU] via SUBCUTANEOUS

## 2015-06-12 MED ORDER — CETYLPYRIDINIUM CHLORIDE 0.05 % MT LIQD
7.0000 mL | Freq: Two times a day (BID) | OROMUCOSAL | Status: DC
  Administered 2015-06-12 – 2015-06-13 (×2): 7 mL via OROMUCOSAL

## 2015-06-12 MED ORDER — INSULIN ASPART 100 UNIT/ML ~~LOC~~ SOLN
10.0000 [IU] | Freq: Once | SUBCUTANEOUS | Status: AC
  Administered 2015-06-12: 10 [IU] via SUBCUTANEOUS

## 2015-06-12 MED ORDER — SODIUM CHLORIDE 0.9 % IV BOLUS (SEPSIS)
500.0000 mL | Freq: Once | INTRAVENOUS | Status: AC
  Administered 2015-06-12: 500 mL via INTRAVENOUS

## 2015-06-12 MED ORDER — INSULIN ASPART 100 UNIT/ML ~~LOC~~ SOLN
3.0000 [IU] | Freq: Three times a day (TID) | SUBCUTANEOUS | Status: DC
  Administered 2015-06-12: 3 [IU] via SUBCUTANEOUS

## 2015-06-12 MED ORDER — SODIUM CHLORIDE 0.9 % IV SOLN
INTRAVENOUS | Status: AC
  Administered 2015-06-12 (×3): via INTRAVENOUS

## 2015-06-12 MED ORDER — INSULIN GLARGINE 100 UNIT/ML ~~LOC~~ SOLN
35.0000 [IU] | Freq: Every day | SUBCUTANEOUS | Status: DC
  Administered 2015-06-12: 35 [IU] via SUBCUTANEOUS
  Filled 2015-06-12 (×2): qty 0.35

## 2015-06-12 NOTE — Progress Notes (Signed)
Inpatient Diabetes Program Recommendations  AACE/ADA: New Consensus Statement on Inpatient Glycemic Control (2013)  Target Ranges:  Prepandial:   less than 140 mg/dL      Peak postprandial:   less than 180 mg/dL (1-2 hours)      Critically ill patients:  140 - 180 mg/dL   Showed patient how to use insulin pen including placing insulin pen needle, 2 unit prime, sites for injection, etc.  She was able to teach back mostly however did need prompting regarding dialing of insulin dose.  She continues to state that her "hands hurt". Patient states that she prefers insulin pen. Will need Rx. For insulin pen needles [phone] and glucose meter ([phone]) as well as the Solostar Lantus pen.  Note CBG's increased.  Consider increasing Lantus to 20 units daily and add Novolog meal coverage 4 units tid with meals-to be held if patient eats less than 50%.  Thanks, Beryl MeagerJenny Alejandria Wessells, RN, BC-ADM Inpatient Diabetes Coordinator Pager [phone] (8a-5p)

## 2015-06-12 NOTE — Progress Notes (Signed)
Patient ID: Melissa [name], female   DOB: 1961/05/05, 54 y.o.   MRN: [id number] S:feels better O:BP 97/63 mmHg  Pulse 71  Temp(Src) 98.7 F (37.1 C) (Oral)  Resp 18  Ht 5' 2.4" (1.585 m)  Wt 101.515 kg (223 lb 12.8 oz)  BMI 40.41 kg/m2  SpO2 96%  Intake/Output Summary (Last 24 hours) at 06/12/15 1052 Last data filed at 06/12/15 0937  Gross per 24 hour  Intake   2120 ml  Output    710 ml  Net   1410 ml   Intake/Output: I/O last 3 completed shifts: In: 2000 [I.V.:2000] Out: 710 [Urine:710]  Intake/Output this shift:  Total I/O In: 120 [P.O.:120] Out: -  Weight change:  Gen:WD obese AAF in NAD  CVS:no rub  Resp:cta VOZ:DGUYQI HKV:QQVZDGL edema of hands bilaterally   Recent Labs Lab 06/11/15 1540 06/12/15 0430  NA 125* 139  136  K 2.9* 2.5*  2.5*  CL 82* 104  101  CO2 27 27  26   GLUCOSE 811* 111*  111*  BUN 22* 14  16  CREATININE 2.39* 1.57*  1.72*  ALBUMIN 3.4* 2.8*  2.8*  CALCIUM 9.2 8.4*  8.1*  PHOS  --  <1.0*  <1.0*  AST 58* 54*  ALT 39 31   Liver Function Tests:  Recent Labs Lab 06/11/15 1540 06/12/15 0430  AST 58* 54*  ALT 39 31  ALKPHOS 176* 116  BILITOT 1.6* 1.2  PROT 7.1 6.1*  ALBUMIN 3.4* 2.8*  2.8*   No results for input(s): LIPASE, AMYLASE in the last 168 hours.  Recent Labs Lab 06/11/15 2023  AMMONIA 54*   CBC:  Recent Labs Lab 06/05/15 1839 06/11/15 1540 06/12/15 0430  WBC 6.6 7.4 7.2  NEUTROABS 4.0 5.6  --   HGB 15.4* 12.6 13.3  HCT 43.5 32.6* 35.9*  MCV 86.1 79.9 82.9  PLT 225 134* 136*   Cardiac Enzymes: No results for input(s): CKTOTAL, CKMB, CKMBINDEX, TROPONINI in the last 168 hours. CBG:  Recent Labs Lab 06/12/15 0308 06/12/15 0412 06/12/15 0516 06/12/15 0619 06/12/15 0703  GLUCAP 121* 117* 107* 126* 134*    Iron Studies: No results for input(s): IRON, TIBC, TRANSFERRIN, FERRITIN in the last 72 hours. Studies/Results: Ct Head Wo Contrast  06/11/2015   CLINICAL DATA:  Weakness,  bilateral arm spasms  EXAM: CT HEAD WITHOUT CONTRAST  TECHNIQUE: Contiguous axial images were obtained from the base of the skull through the vertex without intravenous contrast.  COMPARISON:  06/09/2015  FINDINGS: No skull fracture is noted. No intracranial hemorrhage, mass effect or midline shift. No acute cortical infarction. No mass lesion is noted on this unenhanced scan. Stable mild cerebral atrophy. Moderate cerebellar atrophy is unchanged from prior exam. Stable mild periventricular chronic white matter disease. No mass lesion is noted on this unenhanced scan.  IMPRESSION: No acute intracranial abnormality. Stable cerebral and cerebellar atrophy.   Electronically Signed   By: Lahoma Crocker M.D.   On: 06/11/2015 16:15   . amoxicillin-clavulanate  1 tablet Oral BID  . antiseptic oral rinse  7 mL Mouth Rinse BID  . feeding supplement (ENSURE ENLIVE)  237 mL Oral BID BM  . fluticasone  1 spray Each Nare Daily  . insulin aspart  0-20 Units Subcutaneous TID WC  . insulin glargine  10 Units Subcutaneous Daily  . insulin starter kit- pen needles  1 kit Other Once  . ipratropium  2 spray Each Nare TID  . living well with diabetes book  Does not apply Once  . loratadine  10 mg Oral Daily  . nystatin  5 mL Mouth/Throat TID AC & HS  . pantoprazole  40 mg Oral Daily  . potassium chloride  10 mEq Intravenous Q1 Hr x 4  . potassium chloride SA  40 mEq Oral TID  . potassium phosphate IVPB (mEq)  30 mEq Intravenous Once  . sodium chloride  3 mL Intravenous Q12H    BMET    Component Value Date/Time   NA 136 06/12/2015 0430   NA 139 06/12/2015 0430   K 2.5* 06/12/2015 0430   K 2.5* 06/12/2015 0430   CL 101 06/12/2015 0430   CL 104 06/12/2015 0430   CO2 26 06/12/2015 0430   CO2 27 06/12/2015 0430   GLUCOSE 111* 06/12/2015 0430   GLUCOSE 111* 06/12/2015 0430   BUN 16 06/12/2015 0430   BUN 14 06/12/2015 0430   CREATININE 1.72* 06/12/2015 0430   CREATININE 1.57* 06/12/2015 0430   CREATININE 1.20*  04/08/2015 1252   CALCIUM 8.1* 06/12/2015 0430   CALCIUM 8.4* 06/12/2015 0430   GFRNONAA 32* 06/12/2015 0430   GFRNONAA 36* 06/12/2015 0430   GFRAA 37* 06/12/2015 0430   GFRAA 41* 06/12/2015 0430   CBC    Component Value Date/Time   WBC 7.2 06/12/2015 0430   WBC 5.3 09/29/2014 1455   WBC 5.3 09/29/2014   RBC 4.33 06/12/2015 0430   RBC 4.82 09/29/2014 1455   HGB 13.3 06/12/2015 0430   HGB 14.3 09/29/2014 1455   HCT 35.9* 06/12/2015 0430   HCT 43.5 09/29/2014 1455   PLT 136* 06/12/2015 0430   MCV 82.9 06/12/2015 0430   MCV 90.2 09/29/2014 1455   MCH 30.7 06/12/2015 0430   MCH 29.7 09/29/2014 1455   MCHC 37.0* 06/12/2015 0430   MCHC 32.9 09/29/2014 1455   RDW 13.6 06/12/2015 0430   LYMPHSABS 1.0 06/11/2015 1540   MONOABS 0.8 06/11/2015 1540   EOSABS 0.1 06/11/2015 1540   BASOSABS 0.1 06/11/2015 1540     Assessment/Plan:  1. AKI/CKD- baseline Scr 1.2 with sudden increase to 2.4 with h/o NSAID use, hyperglycemia and h/o purpuric rash several weeks ago.  Acute GN workup underway due to presence of microscopic hematuria and proteinuria.  Pt was started on IVF's and Scr has improved to 1.7.  2. Hyponatremia- resolved 3. Hypotension - improved 4. New onset DM/hyperglycemia- per primary, doing better 5. Hypokalemia- cont to replete 6. Cirrhosis- Elevated ammonia level and AST, ?etoh use  7. Thrombocytopenia 8. Neurologic symptoms- ?related to hyperglycemia- w/u per primary svc  Dezirae Service A

## 2015-06-12 NOTE — Progress Notes (Signed)
Utilization Review completed. Avi Archuleta RN BSN CM 

## 2015-06-12 NOTE — Progress Notes (Signed)
Inpatient Diabetes Program Recommendations  AACE/ADA: New Consensus Statement on Inpatient Glycemic Control (2013)  Target Ranges:  Prepandial:   less than 140 mg/dL      Peak postprandial:   less than 180 mg/dL (1-2 hours)      Critically ill patients:  140 - 180 mg/dL   Referral received.   Results for Pettus, Melissa [name] (MRN [id number]) as of 06/12/2015 13:22  Ref. Range 06/12/2015 04:12 06/12/2015 05:16 06/12/2015 06:19 06/12/2015 07:03 06/12/2015 12:02  Glucose-Capillary Latest Ref Range: 65-99 mg/dL 117 (H) 107 (H) 126 (H) 134 (H) 384 (H)   Diabetes history: New onset diabetes Outpatient Diabetes medications: None Current orders for Inpatient glycemic control:  Novolog resist tid with meals, Lantus 10 units daily  Spoke to patient regarding "new onset diabetes".  Patient see's Dr. Jeffries from [facility].  She states that she was recently on steroids for "sinus infection".   A1C pending.    Discussed briefly the survival skills of diabetes including signs and symptoms of high and low blood sugars and monitoring.  She is interested in insulin pen so I will follow-up this afternoon regarding pen demonstration.  Ordered survival skills education for patient by bedside RN including videos, insulin starter kit, and "Living well with Diabetes" education book.  Will follow.  Thanks, Jenny Simpson, RN, BC-ADM Inpatient Diabetes Coordinator Pager [phone] (8a-5p)           

## 2015-06-12 NOTE — Progress Notes (Signed)
Patient Demographics:    Melissa [name], is a 54 y.o. female, DOB - [birth year], PQD:[id number]  Admit date - 06/11/2015   Admitting Physician Toy Baker, MD  Outpatient Primary MD for the patient is JEFFERY,CHELLE, PA-C  LOS - 1   Chief Complaint  Patient presents with  . Spasms        Subjective:    Melissa [name] today has, No headache, No chest pain, No abdominal pain - No Nausea, No new weakness tingling or numbness, No Cough - SOB.     Assessment  & Plan :     1.NKH - and newly diagnosed type II diabetic. Has been adequately hydrated, placed on Lantus and sliding scale. Diabetic educator consult. Will go home on insulin.  Lab Results  Component Value Date   HGBA1C 5.5 12/28/2013    CBG (last 3)   Recent Labs  06/12/15 0516 06/12/15 0619 06/12/15 0703  GLUCAP 107* 126* 134*    2. Acute renal failure on chronic kidney disease stage II - baseline creatinine around 1.2, likely worse due to #1 above improving with hydration, due to hematuria and mild proteinuria renal on board. Appreciate their input. We will have her follow with nephrology outpatient after discharge.   3. GERD. Continue PPI.   4. Hypokalemia. Replaced. Monitor.   5. History of [place]. Follows with [name] continue to monitor. Supportive care here. Blood pressure low beta blocker held.     Code Status : Full  Family Communication  : Husband  Disposition Plan  : Home 1-2 days  Consults  :  Renal  Procedures  : CT head - non acute  DVT Prophylaxis  :   Heparin    Lab Results  Component Value Date   PLT 136* 06/12/2015    Inpatient Medications  Scheduled Meds: . amoxicillin-clavulanate  1 tablet Oral BID  . antiseptic oral rinse  7 mL Mouth Rinse BID  . feeding supplement (ENSURE ENLIVE)   237 mL Oral BID BM  . fluticasone  1 spray Each Nare Daily  . insulin aspart  0-20 Units Subcutaneous TID WC  . insulin glargine  10 Units Subcutaneous Daily  . insulin starter kit- pen needles  1 kit Other Once  . ipratropium  2 spray Each Nare TID  . living well with diabetes book   Does not apply Once  . loratadine  10 mg Oral Daily  . nystatin  5 mL Mouth/Throat TID AC & HS  . pantoprazole  40 mg Oral Daily  . potassium chloride SA  40 mEq Oral TID  . potassium phosphate IVPB (mEq)  30 mEq Intravenous Once  . sodium chloride  3 mL Intravenous Q12H   Continuous Infusions: . sodium chloride 125 mL/hr at 06/12/15 1052   PRN Meds:.acetaminophen **OR** [DISCONTINUED] acetaminophen, cyclobenzaprine, dextrose, [DISCONTINUED] ondansetron **OR** ondansetron (ZOFRAN) IV  Antibiotics  :    Anti-infectives    Start     Dose/Rate Route Frequency Ordered Stop   06/11/15 2359  amoxicillin-clavulanate (AUGMENTIN) 500-125 MG per tablet 500 mg    Comments:  Augementin 500 mg BID for CrCl < 30 mL/min   1 tablet Oral 2 times daily 06/11/15 2314 06/16/15 0959        Objective:  Filed Vitals:   06/12/15 0034 06/12/15 0440 06/12/15 0539 06/12/15 0957  BP: 103/61 94/56 98/58  97/63  Pulse:  62  71  Temp: 98.4 F (36.9 C) 99.2 F (37.3 C)  98.7 F (37.1 C)  TempSrc: Oral Oral  Oral  Resp: 18 20  18   Height:      Weight:      SpO2:  100%  96%    Wt Readings from Last 3 Encounters:  06/11/15 101.515 kg (223 lb 12.8 oz)  06/09/15 100.245 kg (221 lb)  06/05/15 100.699 kg (222 lb)     Intake/Output Summary (Last 24 hours) at 06/12/15 1115 Last data filed at 06/12/15 0937  Gross per 24 hour  Intake   2120 ml  Output    710 ml  Net   1410 ml     Physical Exam  Awake Alert, Oriented X 3, No new F.N deficits, Normal affect [place].AT,PERRAL Supple Neck,No JVD, No cervical lymphadenopathy appriciated.  Symmetrical Chest wall movement, Good air movement bilaterally, CTAB RRR,No  Gallops,Rubs or new Murmurs, No Parasternal Heave +ve B.Sounds, Abd Soft, No tenderness, No organomegaly appriciated, No rebound - guarding or rigidity. No Cyanosis, Clubbing or edema, No new Rash or bruise       Data Review:   Micro Results Recent Results (from the past 240 hour(s))  Culture, Group A Strep     Status: None   Collection Time: 06/05/15  6:39 PM  Result Value Ref Range Status   Organism ID, Bacteria Normal Upper Respiratory Flora  Final   Organism ID, Bacteria No Beta Hemolytic Streptococci Isolated  Final    Radiology Reports Ct Head Wo Contrast  06/11/2015   CLINICAL DATA:  Weakness, bilateral arm spasms  EXAM: CT HEAD WITHOUT CONTRAST  TECHNIQUE: Contiguous axial images were obtained from the base of the skull through the vertex without intravenous contrast.  COMPARISON:  06/09/2015  FINDINGS: No skull fracture is noted. No intracranial hemorrhage, mass effect or midline shift. No acute cortical infarction. No mass lesion is noted on this unenhanced scan. Stable mild cerebral atrophy. Moderate cerebellar atrophy is unchanged from prior exam. Stable mild periventricular chronic white matter disease. No mass lesion is noted on this unenhanced scan.  IMPRESSION: No acute intracranial abnormality. Stable cerebral and cerebellar atrophy.   Electronically Signed   By: Lahoma Crocker M.D.   On: 06/11/2015 16:15   Ct Head Wo Contrast  06/09/2015   CLINICAL DATA:  54 year old female with 3 day history of dizziness and unsteady gait.  EXAM: CT HEAD WITHOUT CONTRAST  TECHNIQUE: Contiguous axial images were obtained from the base of the skull through the vertex without intravenous contrast.  COMPARISON:  No priors.  FINDINGS: Patchy areas of decreased attenuation are noted throughout the deep and periventricular white matter of the cerebral hemispheres bilaterally, compatible with mild chronic microvascular ischemic disease. No acute intracranial abnormalities. Specifically, no evidence of  acute intracranial hemorrhage, no definite findings of acute/subacute cerebral ischemia, no mass, mass effect, hydrocephalus or abnormal intra or extra-axial fluid collections. Visualized paranasal sinuses and mastoids are well pneumatized. No acute displaced skull fractures are identified.  IMPRESSION: 1. No acute intracranial abnormalities. 2. Mild chronic microvascular ischemic changes in cerebral white matter, as above.   Electronically Signed   By: Vinnie Langton M.D.   On: 06/09/2015 16:39     CBC  Recent Labs Lab 06/05/15 1839 06/11/15 1540 06/12/15 0430  WBC 6.6 7.4 7.2  HGB 15.4* 12.6 13.3  HCT 43.5  32.6* 35.9*  PLT 225 134* 136*  MCV 86.1 79.9 82.9  MCH 30.5 30.9 30.7  MCHC 35.4 37.7* 37.0*  RDW 13.7 13.1 13.6  LYMPHSABS 2.1 1.0  --   MONOABS 0.3 0.8  --   EOSABS 0.1 0.1  --   BASOSABS 0.1 0.1  --     Chemistries   Recent Labs Lab 06/11/15 1540 06/12/15 0430  NA 125* 139  136  K 2.9* 2.5*  2.5*  CL 82* 104  101  CO2 27 27  26   GLUCOSE 811* 111*  111*  BUN 22* 14  16  CREATININE 2.39* 1.57*  1.72*  CALCIUM 9.2 8.4*  8.1*  MG 2.2 2.0  AST 58* 54*  ALT 39 31  ALKPHOS 176* 116  BILITOT 1.6* 1.2   ------------------------------------------------------------------------------------------------------------------ estimated creatinine clearance is 41.1 mL/min (by C-G formula based on Cr of 1.72). ------------------------------------------------------------------------------------------------------------------ No results for input(s): HGBA1C in the last 72 hours. ------------------------------------------------------------------------------------------------------------------ No results for input(s): CHOL, HDL, LDLCALC, TRIG, CHOLHDL, LDLDIRECT in the last 72 hours. ------------------------------------------------------------------------------------------------------------------  Recent Labs  06/12/15 0430  TSH 1.518    ------------------------------------------------------------------------------------------------------------------ No results for input(s): VITAMINB12, FOLATE, FERRITIN, TIBC, IRON, RETICCTPCT in the last 72 hours.  Coagulation profile  Recent Labs Lab 06/11/15 2023  INR 1.19    No results for input(s): DDIMER in the last 72 hours.  Cardiac Enzymes No results for input(s): CKMB, TROPONINI, MYOGLOBIN in the last 168 hours.  Invalid input(s): CK ------------------------------------------------------------------------------------------------------------------ Invalid input(s): POCBNP   Time Spent in minutes   30   Lala Lund K M.D on 06/12/2015 at 11:15 AM  Between 7am to 7pm - Pager - [phone]  After 7pm go to [email or web address] - password [facility]  [facility]   Office  [phone]

## 2015-06-12 NOTE — Progress Notes (Signed)
Schorr MD paged about pt's CBG of 422. Schorr MD placed order for 10 units of insulin SQ and 500 mL NS bolus. Will carry out order.

## 2015-06-12 NOTE — Progress Notes (Signed)
CBG below 250- D51/2 started

## 2015-06-12 NOTE — Progress Notes (Signed)
MD notified of CBG of 559. Order for 20 units of Novolog and 35 units of Lantus given. MD asked to recheck CBG in 4 hours. Will continue to monitor.

## 2015-06-12 NOTE — Care Management Note (Signed)
Case Management Note  Patient Details  Name: Melissa MallickDelana Hinton [name] MRN: [id number] Date of Birth: 06/27/1961  Subjective/Objective:                 Patient newly diagnosed diabetic, getting education today, and plan for discharge tomorrow Friday 06-13-15. Pt insured with [employer]. Will return to home with family.   Action/Plan:  CM will follow for discharge needs. Expected Discharge Date:                  Expected Discharge Plan:  Home/Self Care  In-House Referral:     Discharge planning Services  CM Consult  Post Acute Care Choice:    Choice offered to:     DME Arranged:    DME Agency:     HH Arranged:    HH Agency:     Status of Service:  In process, will continue to follow  Medicare Important Message Given:    Date Medicare IM Given:    Medicare IM give by:    Date Additional Medicare IM Given:    Additional Medicare Important Message give by:     If discussed at Long Length of Stay Meetings, dates discussed:    Additional Comments:  Melissa SabalDebbie Jakobi Thetford, RN 06/12/2015, 10:59 AM

## 2015-06-12 NOTE — Progress Notes (Signed)
Nutrition Brief Note  Patient identified on the Malnutrition Screening Tool (MST) Report. RD also consulted for diet education for new onset diabetes.   Wt Readings from Last 15 Encounters:  06/11/15 223 lb 12.8 oz (101.515 kg)  06/09/15 221 lb (100.245 kg)  06/05/15 222 lb (100.699 kg)  04/08/15 241 lb (109.317 kg)  01/16/15 236 lb (107.049 kg)  12/13/14 236 lb 12.8 oz (107.412 kg)  11/18/14 234 lb (106.142 kg)  09/29/14 234 lb 6.4 oz (106.323 kg)  12/28/13 240 lb (108.863 kg)  05/07/12 227 lb (102.967 kg)  10/08/11 215 lb (97.523 kg)  10/01/11 215 lb (97.523 kg)  01/23/10 177 lb 3.2 oz (80.377 kg)  11/10/09 167 lb 6.1 oz (75.924 kg)   Lab Results  Component Value Date   HGBA1C 5.5 12/28/2013    RD provided "Carbohydrate Counting for People with Diabetes" handout from the Academy of Nutrition and Dietetics. Discussed different food groups and their effects on blood sugar, emphasizing carbohydrate-containing foods. Provided list of carbohydrates and recommended serving sizes of common foods.  Discussed importance of controlled and consistent carbohydrate intake throughout the day. Provided examples of ways to balance meals/snacks and encouraged intake of high-fiber, whole grain complex carbohydrates. Teach back method used.  Expect fair to good compliance.  Body mass index is 40.41 kg/(m^2). Patient meets criteria for extreme obesity, class III based on current BMI.   Current diet order is Carb Modified, patient is consuming approximately 50-100% of meals at this time. Labs and medications reviewed.   No nutrition interventions warranted at this time. If nutrition issues arise, please consult RD.   Melissa [name], RD, LDN, CDE Pager: [phone] After hours Pager: [phone]

## 2015-06-12 NOTE — Progress Notes (Signed)
CRITICAL VALUE ALERT  Critical value received:  Potassium 2.5 phosphorus <1.0  Date of notification:  06/12/2015   Time of notification:  0617  Critical value read back:Yes.    Nurse who received alert:  Marlon Pelora gardiner   MD notified (1st page):  NP Schorr  Time of first page:  0618  MD notified (2nd page):  Time of second page:  Responding MD:   Time MD responded:  NP has not responded as of now

## 2015-06-13 ENCOUNTER — Telehealth: Payer: Self-pay | Admitting: Family Medicine

## 2015-06-13 LAB — BASIC METABOLIC PANEL
Anion gap: 7 (ref 5–15)
BUN: 16 mg/dL (ref 6–20)
CHLORIDE: 109 mmol/L (ref 101–111)
CO2: 21 mmol/L — AB (ref 22–32)
CREATININE: 1.41 mg/dL — AB (ref 0.44–1.00)
Calcium: 7.7 mg/dL — ABNORMAL LOW (ref 8.9–10.3)
GFR calc Af Amer: 47 mL/min — ABNORMAL LOW (ref >60)
GFR calc non Af Amer: 41 mL/min — ABNORMAL LOW (ref >60)
Glucose, Bld: 329 mg/dL — ABNORMAL HIGH (ref 65–99)
Potassium: 3.2 mmol/L — ABNORMAL LOW (ref 3.5–5.1)
Sodium: 137 mmol/L (ref 135–145)

## 2015-06-13 LAB — RHEUMATOID FACTOR: Rhuematoid fact SerPl-aCnc: <7 IU/mL (ref 0.0–13.9)

## 2015-06-13 LAB — HEMOGLOBIN A1C
Hgb A1c MFr Bld: 14.5 % — ABNORMAL HIGH (ref 4.8–5.6)
Mean Plasma Glucose: 369 mg/dL

## 2015-06-13 LAB — GLUCOSE, CAPILLARY
Glucose-Capillary: 422 mg/dL — ABNORMAL HIGH (ref 65–99)
Glucose-Capillary: 279 mg/dL — ABNORMAL HIGH (ref 65–99)
Glucose-Capillary: 278 mg/dL — ABNORMAL HIGH (ref 65–99)
Glucose-Capillary: 373 mg/dL — ABNORMAL HIGH (ref 65–99)

## 2015-06-13 LAB — ANCA TITERS
Atypical P-ANCA titer: <1:20 {titer}
C-ANCA: <1:20 {titer}
P-ANCA: <1:20 {titer}

## 2015-06-13 LAB — MPO/PR-3 (ANCA) ANTIBODIES: Myeloperoxidase Abs: <9 U/mL (ref 0.0–9.0)

## 2015-06-13 LAB — C3 COMPLEMENT: C3 Complement: 87 mg/dL (ref 82–167)

## 2015-06-13 LAB — MAGNESIUM: MAGNESIUM: 2 mg/dL (ref 1.7–2.4)

## 2015-06-13 LAB — ANTI-DNA ANTIBODY, DOUBLE-STRANDED: ds DNA Ab: <1 IU/mL (ref 0–9)

## 2015-06-13 LAB — C4 COMPLEMENT: Complement C4, Body Fluid: 20 mg/dL (ref 14–44)

## 2015-06-13 MED ORDER — INSULIN ASPART 100 UNIT/ML ~~LOC~~ SOLN: SUBCUTANEOUS | Status: DC

## 2015-06-13 MED ORDER — FREESTYLE SYSTEM KIT: 1.0000 | PACK | Freq: Three times a day (TID) | Status: DC

## 2015-06-13 MED ORDER — INSULIN STARTER KIT- PEN NEEDLES (ENGLISH): 1.0000 | Freq: Once | Status: DC

## 2015-06-13 MED ORDER — INSULIN ASPART 100 UNIT/ML ~~LOC~~ SOLN
6.0000 [IU] | Freq: Three times a day (TID) | SUBCUTANEOUS | Status: DC
Start: 2015-06-13 — End: 2015-06-13
  Administered 2015-06-13 (×2): 6 [IU] via SUBCUTANEOUS

## 2015-06-13 MED ORDER — INSULIN STARTER KIT- PEN NEEDLES (ENGLISH)
1.0000 | Freq: Once | Status: DC
  Filled 2015-06-13: qty 1

## 2015-06-13 MED ORDER — INSULIN GLARGINE 100 UNIT/ML ~~LOC~~ SOLN: 45.0000 [IU] | Freq: Every day | SUBCUTANEOUS | Status: DC

## 2015-06-13 MED ORDER — INSULIN GLARGINE 100 UNIT/ML ~~LOC~~ SOLN
45.0000 [IU] | Freq: Every day | SUBCUTANEOUS | Status: DC
  Administered 2015-06-13: 45 [IU] via SUBCUTANEOUS
  Filled 2015-06-13: qty 0.45

## 2015-06-13 MED ORDER — POTASSIUM CHLORIDE CRYS ER 20 MEQ PO TBCR
40.0000 meq | EXTENDED_RELEASE_TABLET | Freq: Once | ORAL | Status: AC
  Administered 2015-06-13: 40 meq via ORAL
  Filled 2015-06-13: qty 2

## 2015-06-13 NOTE — Care Management Note (Signed)
Case Management Note  Patient Details  Name: Melissa [name] MRN: [id number] Date of Birth: 01/04/1961  Subjective/Objective:          Patient for dc today, no needs.          Action/Plan:   Expected Discharge Date:                  Expected Discharge Plan:  Home/Self Care  In-House Referral:     Discharge planning Services  CM Consult  Post Acute Care Choice:    Choice offered to:     DME Arranged:    DME Agency:     HH Arranged:    HH Agency:     Status of Service:  Completed, signed off  Medicare Important Message Given:    Date Medicare IM Given:    Medicare IM give by:    Date Additional Medicare IM Given:    Additional Medicare Important Message give by:     If discussed at Long Length of Stay Meetings, dates discussed:    Additional Comments:  Leone Havenaylor, Tiffney Haughton Clinton, RN 06/13/2015, 10:18 AM

## 2015-06-13 NOTE — Telephone Encounter (Signed)
Left message to Melissa [name] that pt can be seen at the walk-in or make an appt with an available provider in 1 week.

## 2015-06-13 NOTE — Progress Notes (Signed)
RN checked pt's CBG, 422 on glucometer @ 22:28, pt's CBG not be transferred to computer but shown up in glucometer.

## 2015-06-13 NOTE — Progress Notes (Signed)
Patient ID: [name], female   DOB: 1961-06-23, 54 y.o.   MRN: [id number] S:feels much better O:BP 100/58 mmHg  Pulse 77  Temp(Src) 98.2 F (36.8 C) (Oral)  Resp 20  Ht 5' 2.4" (1.585 m)  Wt 101.515 kg (223 lb 12.8 oz)  BMI 40.41 kg/m2  SpO2 100%  Intake/Output Summary (Last 24 hours) at 06/13/15 1155 Last data filed at 06/13/15 1151  Gross per 24 hour  Intake 4864.32 ml  Output    950 ml  Net 3914.32 ml   Intake/Output: I/O last 3 completed shifts: In: 6744.3 [P.O.:360; I.V.:4477.5; Other:500; IV Piggyback:1406.8] Out: 1460 [Urine:1460]  Intake/Output this shift:  Total I/O In: 240 [P.O.:240] Out: 200 [Urine:200] Weight change:  Gen:WD obese AAF in NAD CVS:no rub Resp:cta XVQ:MGQQPY Ext:no edema   Recent Labs Lab 06/11/15 1540 06/12/15 0430 06/12/15 1457 06/13/15 0515  NA 125* 139  136  --  137  K 2.9* 2.5*  2.5* 3.7 3.2*  CL 82* 104  101  --  109  CO2 27 27  26   --  21*  GLUCOSE 811* 111*  111*  --  329*  BUN 22* 14  16  --  16  CREATININE 2.39* 1.57*  1.72*  --  1.41*  ALBUMIN 3.4* 2.8*  2.8*  --   --   CALCIUM 9.2 8.4*  8.1*  --  7.7*  PHOS  --  <1.0*  <1.0*  --   --   AST 58* 54*  --   --   ALT 39 31  --   --    Liver Function Tests:  Recent Labs Lab 06/11/15 1540 06/12/15 0430  AST 58* 54*  ALT 39 31  ALKPHOS 176* 116  BILITOT 1.6* 1.2  PROT 7.1 6.1*  ALBUMIN 3.4* 2.8*  2.8*   No results for input(s): LIPASE, AMYLASE in the last 168 hours.  Recent Labs Lab 06/11/15 2023  AMMONIA 54*   CBC:  Recent Labs Lab 06/11/15 1540 06/12/15 0430  WBC 7.4 7.2  NEUTROABS 5.6  --   HGB 12.6 13.3  HCT 32.6* 35.9*  MCV 79.9 82.9  PLT 134* 136*   Cardiac Enzymes: No results for input(s): CKTOTAL, CKMB, CKMBINDEX, TROPONINI in the last 168 hours. CBG:  Recent Labs Lab 06/12/15 1202 06/12/15 1725 06/12/15 2228 06/13/15 0534 06/13/15 0744  GLUCAP 384* 559* 422* 279* 278*    Iron Studies: No results for input(s):  IRON, TIBC, TRANSFERRIN, FERRITIN in the last 72 hours. Studies/Results: Ct Head Wo Contrast  06/11/2015   CLINICAL DATA:  Weakness, bilateral arm spasms  EXAM: CT HEAD WITHOUT CONTRAST  TECHNIQUE: Contiguous axial images were obtained from the base of the skull through the vertex without intravenous contrast.  COMPARISON:  06/09/2015  FINDINGS: No skull fracture is noted. No intracranial hemorrhage, mass effect or midline shift. No acute cortical infarction. No mass lesion is noted on this unenhanced scan. Stable mild cerebral atrophy. Moderate cerebellar atrophy is unchanged from prior exam. Stable mild periventricular chronic white matter disease. No mass lesion is noted on this unenhanced scan.  IMPRESSION: No acute intracranial abnormality. Stable cerebral and cerebellar atrophy.   Electronically Signed   By: Lahoma Crocker M.D.   On: 06/11/2015 16:15   US Renal  06/12/2015   CLINICAL DATA:  Acute renal failure  EXAM: RENAL / URINARY TRACT ULTRASOUND COMPLETE  COMPARISON:  CT scan 11/24/2009  FINDINGS: Right Kidney:  Length: 11.7 cm. Normal renal cortical thickness but increased echogenicity  suggesting medical renal disease. No renal mass or hydronephrosis.  Left Kidney:  Length: 10.4 cm. Normal renal cortical thickness but increased echogenicity suggesting medical renal disease. No renal mass or hydronephrosis.  Bladder:  Appears normal for degree of bladder distention.  IMPRESSION: Increased echogenicity of both kidneys suggesting medical renal disease. No renal mass lesions or hydronephrosis.   Electronically Signed   By: Marijo Sanes M.D.   On: 06/12/2015 13:19   . amoxicillin-clavulanate  1 tablet Oral BID  . antiseptic oral rinse  7 mL Mouth Rinse BID  . feeding supplement (ENSURE ENLIVE)  237 mL Oral BID BM  . fluticasone  1 spray Each Nare Daily  . heparin subcutaneous  5,000 Units Subcutaneous 3 times per day  . insulin aspart  0-15 Units Subcutaneous TID WC  . insulin aspart  0-5 Units  Subcutaneous QHS  . insulin aspart  6 Units Subcutaneous TID WC  . insulin glargine  45 Units Subcutaneous Daily  . insulin starter kit- pen needles  1 kit Other Once  . ipratropium  2 spray Each Nare TID  . loratadine  10 mg Oral Daily  . nystatin  5 mL Mouth/Throat TID AC & HS  . pantoprazole  40 mg Oral Daily  . sodium chloride  3 mL Intravenous Q12H    BMET    Component Value Date/Time   NA 137 06/13/2015 0515   K 3.2* 06/13/2015 0515   CL 109 06/13/2015 0515   CO2 21* 06/13/2015 0515   GLUCOSE 329* 06/13/2015 0515   BUN 16 06/13/2015 0515   CREATININE 1.41* 06/13/2015 0515   CREATININE 1.20* 04/08/2015 1252   CALCIUM 7.7* 06/13/2015 0515   GFRNONAA 41* 06/13/2015 0515   GFRAA 47* 06/13/2015 0515   CBC    Component Value Date/Time   WBC 7.2 06/12/2015 0430   WBC 5.3 09/29/2014 1455   WBC 5.3 09/29/2014   RBC 4.33 06/12/2015 0430   RBC 4.82 09/29/2014 1455   HGB 13.3 06/12/2015 0430   HGB 14.3 09/29/2014 1455   HCT 35.9* 06/12/2015 0430   HCT 43.5 09/29/2014 1455   PLT 136* 06/12/2015 0430   MCV 82.9 06/12/2015 0430   MCV 90.2 09/29/2014 1455   MCH 30.7 06/12/2015 0430   MCH 29.7 09/29/2014 1455   MCHC 37.0* 06/12/2015 0430   MCHC 32.9 09/29/2014 1455   RDW 13.6 06/12/2015 0430   LYMPHSABS 1.0 06/11/2015 1540   MONOABS 0.8 06/11/2015 1540   EOSABS 0.1 06/11/2015 1540   BASOSABS 0.1 06/11/2015 1540     Assessment/Plan:  1. AKI/CKD stage 3- baseline Scr 1.2 with sudden increase to 2.4 with h/o NSAID use, hyperglycemia and h/o purpuric rash several weeks ago. Acute GN workup underway due to presence of microscopic hematuria and proteinuria.  1. Scr improved to 1.4 and is doing well.  Likely hemodynamically mediated. 2. Serologies pending but normal complement levels, negative RF. 3. Stressed importance of diabetes control, avoidance of nephrotoxic agents such as NSAIDs/Cox-II I's and IV contrast material 4. F/u with PCP and will arrange outpt follow up  in 6-8 weeks.  2. Hyponatremia- resolved 3. Hypotension - improved 4. New onset DM/hyperglycemia- per primary, doing better 5. Hypokalemia- cont to replete 6. Cirrhosis- Elevated ammonia level and AST, ?etoh use  7. Thrombocytopenia- unclear etiology, w/u per primary svc 8. Neurologic symptoms- ?related to hyperglycemia- w/u per primary svc now resolved 9. Disposition for discharge today.  F/u with PCP Dr. Hazle Coca at [place]

## 2015-06-13 NOTE — Progress Notes (Addendum)
Spoke with Dr Thedore MinsSingh [facility] for discharge today due to tremendous difference in cost.  Discharge orders are lantus and novolog correction and meal coverage. MD confirmed these orders. Will explain to patient. (have paged care mgmt to check for potential availability of lantus without the excess costs). AD-submitted information for a lantus savings card, but these cards are no longer available since the Toujeo savings cards have taken the place of the lantus. AC  Thank you Lenor CoffinAnn Madden Piazza, RN, MSN, CDE  Diabetes Inpatient Program Office: [phone] Pager: [phone] 8:00 am to 5:00 pm

## 2015-06-13 NOTE — Progress Notes (Signed)
S/W MARY @ [facility] # [id number]   LANTUS SOLISTAR PEN 150 UNITS IN BOX OF 5 - DON'T HAVE    100 UNITS 3ML PEN- YES  COVER-YES  CO-PAY- $50.00  PRIOR APPROVAL - NO  PHARMACY: [facility]  MAIL ORDER $100.00 FOR 3 MO

## 2015-06-13 NOTE — Discharge Instructions (Signed)
Follow with Primary MD JEFFERY,CHELLE, PA-C in 3 days   Get CBC, CMP, 2 view Chest X ray checked  by Primary MD next visit.    Activity: As tolerated with Full fall precautions use walker/cane & assistance as needed   Disposition Home     Diet: Heart Healthy Low Carb.  Accuchecks 4 times/day, Once in AM empty stomach and then before each meal. Log in all results and show them to your Prim.MD in 3 days. If any glucose reading is under 80 or above 300 call your Prim MD immidiately. Follow Low glucose instructions for glucose under 80 as instructed.    For Heart failure patients - Check your Weight same time everyday, if you gain over 2 pounds, or you develop in leg swelling, experience more shortness of breath or chest pain, call your Primary MD immediately. Follow Cardiac Low Salt Diet and 1.5 lit/day fluid restriction.   On your next visit with your primary care physician please Get Medicines reviewed and adjusted.   Please request your Prim.MD to go over all Hospital Tests and Procedure/Radiological results at the follow up, please get all Hospital records sent to your Prim MD by signing hospital release before you go home.   If you experience worsening of your admission symptoms, develop shortness of breath, life threatening emergency, suicidal or homicidal thoughts you must seek medical attention immediately by calling 911 or calling your MD immediately  if symptoms less severe.  You Must read complete instructions/literature along with all the possible adverse reactions/side effects for all the Medicines you take and that have been prescribed to you. Take any new Medicines after you have completely understood and accpet all the possible adverse reactions/side effects.   Do not drive, operating heavy machinery, perform activities at heights, swimming or participation in water activities or provide baby sitting services if your were admitted for syncope or siezures until you have  seen by Primary MD or a Neurologist and advised to do so again.  Do not drive when taking Pain medications.    Do not take more than prescribed Pain, Sleep and Anxiety Medications  Special Instructions: If you have smoked or chewed Tobacco  in the last 2 yrs please stop smoking, stop any regular Alcohol  and or any Recreational drug use.  Wear Seat belts while driving.   Please note  You were cared for by a hospitalist during your hospital stay. If you have any questions about your discharge medications or the care you received while you were in the hospital after you are discharged, you can call the unit and asked to speak with the hospitalist on call if the hospitalist that took care of you is not available. Once you are discharged, your primary care physician will handle any further medical issues. Please note that NO REFILLS for any discharge medications will be authorized once you are discharged, as it is imperative that you return to your primary care physician (or establish a relationship with a primary care physician if you do not have one) for your aftercare needs so that they can reassess your need for medications and monitor your lab values.

## 2015-06-13 NOTE — Progress Notes (Signed)
Brief Education Follow-Up Note  Received another consult for diet education for new onset diabetes. Noted pt was educated by this RD on 06/12/15.   Spoke with pt at bedside, who reports that she feels confident managing diabetes at home. She shares with this RD that she has been administering her own insulin. Her largest concern is blood sugar control in the hospital; "thye keep going up and down while I'm here".  She also shares that she has watched the DM educational videos multiple times and found the educational handouts (Plate Method) provided to her very helpful. She has a good support system in her husband and reveals that she shared the plate method handout with him.   Pt reports that she is very motivated to comply with self-management principles to prevent complications. She is interested in attending outpatient education to reinforce knowledge; encouraged her to do this.   Reviewed DM diet guidelines with pt, who revealed she has no further questions at this time. She feel confident in her ability to prepare meals at home with the help of her husband. Plan is for pt to discharge home today.   Ryah Cribb A. Mayford KnifeWilliams, RD, LDN, CDE Pager: [phone] After hours Pager: [phone]

## 2015-06-13 NOTE — Progress Notes (Signed)
Pt's CBG 279 @ 0534.

## 2015-06-13 NOTE — Telephone Encounter (Signed)
She can follow-up with one my colleagues who has an available appointment, or she can come to see me at the walk-in.  I am here next week on Tuesday 6p-close, and Friday 8a-close.

## 2015-06-13 NOTE — Progress Notes (Signed)
Inpatient Diabetes Program Recommendations  AACE/ADA: New Consensus Statement on Inpatient Glycemic Control (2013)  Target Ranges:  Prepandial:   less than 140 mg/dL      Peak postprandial:   less than 180 mg/dL (1-2 hours)      Critically ill patients:  140 - 180 mg/dL  Inpatient Diabetes Program Recommendations Insulin - Basal: Agree with lantus increase to 45 units lantus for here and at discharge Correction (SSI): Will also need a correction (if pt can handle)-will talk with patient and assess Insulin - Meal Coverage: Pt will need at discharge as 6 units tidwc if eats at least 50% of meals  Pt has been taught the insulin pen yesterday-I will review again today and clarify knowledge deficits. Pt would benefit from out-patient education as well. Will order if patient willing to attend.  Thank you Lenor CoffinAnn Raechal Raben, RN, MSN, CDE  Diabetes Inpatient Program Office: [phone] Pager: [phone] 8:00 am to 5:00 pm

## 2015-06-13 NOTE — Progress Notes (Addendum)
Inpatient Diabetes Program Recommendations  AACE/ADA: New Consensus Statement on Inpatient Glycemic Control (2013)  Target Ranges:  Prepandial:   less than 140 mg/dL      Peak postprandial:   less than 180 mg/dL (1-2 hours)      Critically ill patients:  140 - 180 mg/dL   Have talked with patient at length as to an insulin regimen of basal/bolus at home.  Melissa [name] is beginning to understand which insulin she gives once a day and which to give tidwc and possibly HS Introduced the idea of meal coverage, however I strongly do not feel that patient can handle an additional calculation nor another injection to be combined with correction.  I think patient can give her basal dose once a day without a problem, as she thought 7am would be the best time for her. She knows that she must base the rapid acting insulin on her glucose prior to eating although she says she may not eat breakfast. Emphasized with her that 3 small meals and smaller snacks are extremely important, that she needs check her cbg prior to each meal and base her dose on the scale given her at discharge.This scale would need to cover her food intake as well as correction, as the previous "Sliding Scale Insulin" was used for both correction and meal coverage. She is to watch the insulin pen and cbg video again and practice using the floor meter. Demonstrated and she return demonstrated using the insulin pen, but she will need practice and reinforcement. Will order insulin pen starter kit as she states she has not been given one. She stated that the MD mentioned adjusting her basal insulin-however, I truly don't think she can handle doing this at this time either. Melissa [name] needs practice with pen, left pen and needle in the room emphasizing that this pen is a demo only and not to take home. Will request RD consult as she has many questions regarding counting her carbohydrates and nutritional needs. Also, our department has savings coupons for using the  basal Toujeo (a form of basal with insulin dosing equivalent to the lantus dose) at $15.00 per 5 pack for 2 years. Otherwise the patient would have to pay quite a bit more for lantus at home. Due to increase in basal to start today, it would be most helpful to see fasting glucose level in the am and to better estimate a correction scale to meet her needs. Will check with her again. She is willing to stay through today and be discharged tomorrow.  Also, HgbA1C may not be totally accurate due to thrombocytopenia and recent use of steroid therapy over the past 2 months.  Thank you Rosita Kea, RN, MSN, CDE  Diabetes Inpatient Program Office: [phone] Pager: [phone] 8:00 am to 5:00 pm

## 2015-06-13 NOTE — Discharge Summary (Signed)
Melissa [name], is a 54 y.o. female  DOB 11/05/1961  MRN [id number].  Admission date:  06/11/2015  Admitting Physician  Toy Baker, MD  Discharge Date:  06/13/2015   Primary MD  JEFFERY,CHELLE, PA-C  Recommendations for primary care physician for things to follow:   Glycemic control, follow pending ANA, Anca, anti-double-stranded DNA antibody results.  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 Allergy     sinus allergies  . GERD (gastroesophageal reflux disease)   . Heart murmur   . Hypertension   . Esophageal varices   . Chronic kidney disease (CKD), stage III (moderate)     Archie Endo 06/11/2015  . Diabetes mellitus without complication dx'd 02/17/253    Archie Endo 06/11/2015    Past Surgical History  Procedure Laterality Date  . Cesarean section  1979  . Carpal tunnel release Right 1987  . Reduction mammaplasty Bilateral 2005  . Abdominal hysterectomy  1997    "partial"  . Tubal ligation  1981       HPI  from the history and physical done on the day of admission:     Melissa [name] is a 54 y.o. female   has a past medical history of Allergy; GERD (gastroesophageal reflux disease); Heart murmur; Hypertension; and Esophageal varices.   Presented with  Worsening fatigue blurry vision increase urination. She reports arm twitching and spasms now reports some soreness in the muscles of the arms and legs. This  started last night. Patient was afraid she was having a seizure but no LOC. Patient was taken extra ibuprofen to help with the discomfort. She had a CT scan done X 2 showing no acute abnormalities. Per PCP thought that twitching of her hand was due to Tenosynovitis.   Patient presented to emergency department was thought to have glucose of 811. He was given 2 L of normal saline and started some IV fluids and insulin drip. Blood glucose now down to 505  She was noted to have Cr. 2.39 and large amount of RBC in urine. Denies any fever or chills.   Patient reports hx of "purpura" was treated with steroids. Thought to be due to viral illness. She had a biopsy done by Dr. Rosann Auerbach. Her Sed Rate was noted to be elevated to 45. She had sore throat tested negative for strep and was diagnosed with thrush was treated by Nystatin and now improved. She has recurrent nasal conjestion and have beeen put on Augmentin to treat chronic sinusitis.  Patient was drinking large amounts of fluids mainly Soda and Cranberry juice.   Of note patient  has hx of Idiopathic cirrhosis. Followed by [name], she has been seen by [facility]. Tamala Julian at [facility].     Hospital Course:   1.NKH - in a  newly diagnosed type II diabetic. Has been adequately hydrated, was seen by Diabetic educator and diabetic and insulin teaching. Will go home on insulin. Will be placed on Lantus 45 units every morning along with pre-meal NovoLog and sliding scale. Insulin and testing supplies provided. Instructions on testing and Lantus adjustment given by me personally. Requested to follow with PCP in 3 days with her CBG testing log book.  Lab Results  Component Value Date   HGBA1C 14.5* 06/12/2015    CBG (last 3)   Recent Labs  06/12/15 2228 06/13/15 0534 06/13/15 0744  GLUCAP 422* 279* 278*        2. 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 Blood pressure too soft to add ACE inhibitor. Pending ANA, Anca, double-stranded DNA antibody along with various serological tests ordered by renal.    3. GERD. Continue PPI.    4. Hypokalemia. Replaced. Monitor.    5. History of [place]. Follows with [name] continue to monitor. Supportive care here. On low-dose diuretic and beta blocker at home. Request PCP to continue monitoring blood pressure and BMP closely.      Discharge Condition: Stable  Follow UP  Follow-up Information    Follow up with JEFFERY,CHELLE, PA-C. Schedule an appointment as soon as possible for a visit in 3 days.   Specialty:  Physician Assistant   Why:  DM   Contact information:   [place] [address] [phone]       Follow up with Ulla Potash., MD. Schedule an appointment as soon as possible for a visit in 1 week.   Specialty:  Nephrology   Why:  ARF   Contact information:   [place] [address] [phone]       Follow up with Renato Shin, MD. Schedule an appointment as soon as possible for a visit in 1 week.   Specialty:  Endocrinology   Why:  DM   Contact information:   [address] [place] [address] [phone]        Consults obtained - DM educator, renal  Diet and Activity recommendation: See Discharge Instructions below  Discharge Instructions     Discharge Instructions    Ambulatory referral to Nutrition and Diabetic Education    Complete by:  As directed   New onset diabetes     Discharge instructions    Complete by:  As directed   Follow with Primary MD JEFFERY,CHELLE, PA-C in 3 days   Get CBC, CMP, 2 view Chest X ray checked  by Primary MD next visit.    Activity: As tolerated with Full fall precautions use walker/cane & assistance as needed   Disposition Home      Diet: Heart Healthy Low Carb.  Accuchecks 4 times/day, Once in AM empty stomach and then before each meal. Log in all results and show them to your Prim.MD in 3 days. If any glucose reading is under 80 or above 300 call your Prim MD immidiately. Follow Low glucose instructions for glucose under 80 as instructed.  For Heart failure patients - Check your Weight same time everyday, if you gain over 2 pounds, or you develop in leg swelling, experience more shortness of breath or chest pain, call your Primary MD immediately. Follow Cardiac Low Salt Diet and 1.5 lit/day fluid restriction.   On your next visit with your primary care physician please Get Medicines reviewed and adjusted.   Please request your Prim.MD to go over all Hospital Tests and Procedure/Radiological results at the follow up, please get all Hospital records sent to your Prim MD by signing hospital release before you go home.   If you experience worsening of your admission symptoms, develop shortness of breath, life threatening emergency, suicidal or homicidal thoughts you must seek medical attention immediately by calling 911 or calling your MD immediately  if symptoms less severe.  You Must read complete instructions/literature along with all the possible adverse reactions/side effects for all the Medicines you take and that have been prescribed to you. Take any new Medicines after you have completely understood and accpet all the possible adverse reactions/side effects.   Do not drive, operating heavy machinery, perform activities at heights, swimming or participation in water activities or provide baby sitting services if your were admitted for syncope or siezures until you have seen by Primary MD or a Neurologist and advised to do so again.  Do not drive when taking Pain medications.    Do not take more than prescribed Pain, Sleep and Anxiety Medications  Special Instructions: If you have smoked or chewed  Tobacco  in the last 2 yrs please stop smoking, stop any regular Alcohol  and or any Recreational drug use.  Wear Seat belts while driving.   Please note  You were cared for by a hospitalist during your hospital stay. If you have any questions about your discharge medications or the care you received while you were in the hospital after you are discharged, you can call the unit and asked to speak with the hospitalist on call if the hospitalist that took care of you is not available. Once you are discharged, your primary care physician will handle any further medical issues. Please note that NO REFILLS for any discharge medications will be authorized once you are discharged, as it is imperative that you return to your primary care physician (or establish a relationship with a primary care physician if you do not have one) for your aftercare needs so that they can reassess your need for medications and monitor your lab values.     Increase activity slowly    Complete by:  As directed              Discharge Medications       Medication List    STOP taking these medications        amoxicillin-clavulanate 500-125 MG per tablet  Commonly known as:  AUGMENTIN     metoCLOPramide 10 MG tablet  Commonly known as:  REGLAN      TAKE these medications        benzonatate 200 MG capsule  Commonly known as:  TESSALON  Take 200 mg by mouth daily as needed. For coughing     budesonide 32 MCG/ACT nasal spray  Commonly known as:  RHINOCORT AQUA  Place 1 spray into both nostrils 2 (two) times daily.     cetirizine 10 MG tablet  Commonly known as:  ZYRTEC  Take 1 tablet (10 mg total) by mouth daily.     cyclobenzaprine 10  MG tablet  Commonly known as:  FLEXERIL  TAKE 1 TABLET (10 MG TOTAL) BY MOUTH AT BEDTIME.     esomeprazole 40 MG capsule  Commonly known as:  NEXIUM  Take 1 capsule (40 mg total) by mouth 1 day or 1 dose.     glucose monitoring kit monitoring kit  1 each by Does not  apply route 4 (four) times daily - after meals and at bedtime. 1 month Diabetic Testing Supplies for QAC-QHS accuchecks.Any brand OK. DX- E11.65     Guaifenesin 1200 MG Tb12  Commonly known as:  MUCINEX MAXIMUM STRENGTH  Take 1 tablet (1,200 mg total) by mouth every 12 (twelve) hours as needed.     insulin aspart 100 UNIT/ML injection  Commonly known as:  NOVOLOG  Take 4 units with each meal Plus extra dose based on this scale 140-199 - 2 units, 200-250 - 4 units, 251-299 - 8 units,  300-349 - 10 units,  350 or above 12 units. Dispense syringes and needles as needed, Ok to switch to PEN if approved. Substitute to any brand approved. DX DM2, Code E11.65     insulin glargine 100 UNIT/ML injection  Commonly known as:  LANTUS  Inject 0.45 mLs (45 Units total) into the skin at bedtime. Dispense insulin pen if approved, if not dispense as needed syringes and needles for 1 month supply. Can switch to Levemir. Diagnosis E 11.65.     ipratropium 0.06 % nasal spray  Commonly known as:  ATROVENT  Place 2 sprays into both nostrils 4 (four) times daily.     montelukast 10 MG tablet  Commonly known as:  SINGULAIR  Take 1 tablet (10 mg total) by mouth at bedtime.     nadolol 40 MG tablet  Commonly known as:  CORGARD  TAKE 1 TABLET (40 MG TOTAL) BY MOUTH DAILY.     nystatin 100000 UNIT/ML suspension  Commonly known as:  MYCOSTATIN  Use as directed 5 mLs in the mouth or throat 4 (four) times daily.     Nystatin Powd  5 mLs by Does not apply route 4 (four) times daily. 61m swish and swallow     ondansetron 8 MG disintegrating tablet  Commonly known as:  ZOFRAN-ODT  Take 1 tablet (8 mg total) by mouth every 8 (eight) hours as needed for nausea.     triamterene-hydrochlorothiazide 37.5-25 MG per tablet  Commonly known as:  MAXZIDE-25  TAKE 1 TABLET BY MOUTH DAILY.        Major procedures and Radiology Reports - PLEASE review detailed and final reports for all details, in brief -       Ct  Head Wo Contrast  06/11/2015   CLINICAL DATA:  Weakness, bilateral arm spasms  EXAM: CT HEAD WITHOUT CONTRAST  TECHNIQUE: Contiguous axial images were obtained from the base of the skull through the vertex without intravenous contrast.  COMPARISON:  06/09/2015  FINDINGS: No skull fracture is noted. No intracranial hemorrhage, mass effect or midline shift. No acute cortical infarction. No mass lesion is noted on this unenhanced scan. Stable mild cerebral atrophy. Moderate cerebellar atrophy is unchanged from prior exam. Stable mild periventricular chronic white matter disease. No mass lesion is noted on this unenhanced scan.  IMPRESSION: No acute intracranial abnormality. Stable cerebral and cerebellar atrophy.   Electronically Signed   By: LLahoma CrockerM.D.   On: 06/11/2015 16:15   Ct Head Wo Contrast  06/09/2015   CLINICAL DATA:  54year old female with 3 day  history of dizziness and unsteady gait.  EXAM: CT HEAD WITHOUT CONTRAST  TECHNIQUE: Contiguous axial images were obtained from the base of the skull through the vertex without intravenous contrast.  COMPARISON:  No priors.  FINDINGS: Patchy areas of decreased attenuation are noted throughout the deep and periventricular white matter of the cerebral hemispheres bilaterally, compatible with mild chronic microvascular ischemic disease. No acute intracranial abnormalities. Specifically, no evidence of acute intracranial hemorrhage, no definite findings of acute/subacute cerebral ischemia, no mass, mass effect, hydrocephalus or abnormal intra or extra-axial fluid collections. Visualized paranasal sinuses and mastoids are well pneumatized. No acute displaced skull fractures are identified.  IMPRESSION: 1. No acute intracranial abnormalities. 2. Mild chronic microvascular ischemic changes in cerebral white matter, as above.   Electronically Signed   By: Vinnie Langton M.D.   On: 06/09/2015 16:39   US Renal  06/12/2015   CLINICAL DATA:  Acute renal failure   EXAM: RENAL / URINARY TRACT ULTRASOUND COMPLETE  COMPARISON:  CT scan 11/24/2009  FINDINGS: Right Kidney:  Length: 11.7 cm. Normal renal cortical thickness but increased echogenicity suggesting medical renal disease. No renal mass or hydronephrosis.  Left Kidney:  Length: 10.4 cm. Normal renal cortical thickness but increased echogenicity suggesting medical renal disease. No renal mass or hydronephrosis.  Bladder:  Appears normal for degree of bladder distention.  IMPRESSION: Increased echogenicity of both kidneys suggesting medical renal disease. No renal mass lesions or hydronephrosis.   Electronically Signed   By: Marijo Sanes M.D.   On: 06/12/2015 13:19    Micro Results      Recent Results (from the past 240 hour(s))  Culture, Group A Strep     Status: None   Collection Time: 06/05/15  6:39 PM  Result Value Ref Range Status   Organism ID, Bacteria Normal Upper Respiratory Flora  Final   Organism ID, Bacteria No Beta Hemolytic Streptococci Isolated  Final       Today   Subjective    Aubreana Tedrick today has no headache,no chest abdominal pain,no new weakness tingling or numbness, feels much better wants to go home today.     Objective   Blood pressure 100/58, pulse 77, temperature 98.2 F (36.8 C), temperature source Oral, resp. rate 20, height 5' 2.4" (1.585 m), weight 101.515 kg (223 lb 12.8 oz), SpO2 100 %.   Intake/Output Summary (Last 24 hours) at 06/13/15 0850 Last data filed at 06/13/15 0700  Gross per 24 hour  Intake 4744.32 ml  Output    750 ml  Net 3994.32 ml    Exam Awake Alert, Oriented x 3, No new F.N deficits, Normal affect [place].AT,PERRAL Supple Neck,No JVD, No cervical lymphadenopathy appriciated.  Symmetrical Chest wall movement, Good air movement bilaterally, CTAB RRR,No Gallops,Rubs or new Murmurs, No Parasternal Heave +ve B.Sounds, Abd Soft, Non tender, No organomegaly appriciated, No rebound -guarding or rigidity. No Cyanosis, Clubbing or edema, No  new Rash or bruise   Data Review   CBC w Diff: Lab Results  Component Value Date   WBC 7.2 06/12/2015   WBC 5.3 09/29/2014   WBC 5.3 09/29/2014   HGB 13.3 06/12/2015   HGB 14.3 09/29/2014   HCT 35.9* 06/12/2015   HCT 43.5 09/29/2014   PLT 136* 06/12/2015   LYMPHOPCT 13 06/11/2015   MONOPCT 10 06/11/2015   EOSPCT 1 06/11/2015   BASOPCT 1 06/11/2015    CMP: Lab Results  Component Value Date   NA 137 06/13/2015   K 3.2* 06/13/2015  CL 109 06/13/2015   CO2 21* 06/13/2015   BUN 16 06/13/2015   CREATININE 1.41* 06/13/2015   CREATININE 1.20* 04/08/2015   PROT 6.1* 06/12/2015   ALBUMIN 2.8* 06/12/2015   ALBUMIN 2.8* 06/12/2015   BILITOT 1.2 06/12/2015   ALKPHOS 116 06/12/2015   AST 54* 06/12/2015   ALT 31 06/12/2015  .  Lab Results  Component Value Date   HGBA1C 14.5* 06/12/2015     Total Time in preparing paper work, data evaluation and todays exam - 35 minutes  Thurnell Lose M.D on 06/13/2015 at 8:50 AM  [facility]   Office  [phone]

## 2015-06-13 NOTE — Telephone Encounter (Signed)
Tashara from [facility] called to make a 1 week hospital follow up appt for this pt.Benay Pillow.Contact Info [phone].  You are booked through Aug. What would you like me to do?

## 2015-06-15 NOTE — Progress Notes (Signed)
History and physical examinations reviewed in detail with PA English. Agree with assessment and plan. Melissa [name] Melissa [name], M.D. [facility]  [place] [address] [place], [place]  [address] [phone] phone [phone] fax  

## 2015-06-16 LAB — ANTINUCLEAR ANTIBODIES, IFA: ANA Ab, IFA: NEGATIVE

## 2015-06-18 ENCOUNTER — Telehealth: Payer: Self-pay | Admitting: Emergency Medicine

## 2015-06-18 NOTE — Telephone Encounter (Signed)
I called patient and spoke with her. She was admitted with hyperglycemia. I told her I was sorry about having her have to go to the hospital and be admitted for treatment of her diabetes. She is going to follow-up later this week with Chelle.

## 2015-06-20 ENCOUNTER — Ambulatory Visit (INDEPENDENT_AMBULATORY_CARE_PROVIDER_SITE_OTHER): Payer: 59 | Admitting: Physician Assistant

## 2015-06-20 VITALS — BP 136/80 | HR 74 | Temp 98.0°F | Resp 16 | Ht 62.0 in | Wt 232.6 lb

## 2015-06-20 DIAGNOSIS — N179 Acute kidney failure, unspecified: Secondary | ICD-10-CM | POA: Diagnosis not present

## 2015-06-20 DIAGNOSIS — E139 Other specified diabetes mellitus without complications: Secondary | ICD-10-CM | POA: Diagnosis not present

## 2015-06-20 DIAGNOSIS — I10 Essential (primary) hypertension: Secondary | ICD-10-CM

## 2015-06-20 DIAGNOSIS — Z114 Encounter for screening for human immunodeficiency virus [HIV]: Secondary | ICD-10-CM | POA: Diagnosis not present

## 2015-06-20 LAB — COMPREHENSIVE METABOLIC PANEL
ALT: 39 U/L — AB (ref 0–35)
AST: 55 U/L — ABNORMAL HIGH (ref 0–37)
Albumin: 3.4 g/dL — ABNORMAL LOW (ref 3.5–5.2)
Alkaline Phosphatase: 149 U/L — ABNORMAL HIGH (ref 39–117)
BILIRUBIN TOTAL: 1.5 mg/dL — AB (ref 0.2–1.2)
BUN: 13 mg/dL (ref 6–23)
CO2: 26 mEq/L (ref 19–32)
CREATININE: 1.05 mg/dL (ref 0.50–1.10)
Calcium: 9.4 mg/dL (ref 8.4–10.5)
Chloride: 104 mEq/L (ref 96–112)
Glucose, Bld: 174 mg/dL — ABNORMAL HIGH (ref 70–99)
POTASSIUM: 3.1 meq/L — AB (ref 3.5–5.3)
Sodium: 141 mEq/L (ref 135–145)
Total Protein: 6.4 g/dL (ref 6.0–8.3)

## 2015-06-20 LAB — LIPID PANEL
CHOL/HDL RATIO: 3.7 ratio
Cholesterol: 165 mg/dL (ref 0–200)
HDL: 45 mg/dL — AB (ref >=46)
LDL Cholesterol: 101 mg/dL — ABNORMAL HIGH (ref 0–99)
TRIGLYCERIDES: 94 mg/dL (ref <150)
VLDL: 19 mg/dL (ref 0–40)

## 2015-06-20 LAB — GLUCOSE, POCT (MANUAL RESULT ENTRY): POC Glucose: 166 mg/dl — AB (ref 70–99)

## 2015-06-20 NOTE — Patient Instructions (Addendum)
Keep up the great work! Get a flu vaccine when you begin to hear advertising about it, definitely before Thanksgiving, and send me the date you get it. Ask you eye doctor to send me a note about your eye health.  Continue the current insulin doses. If your sugar runs below 70, let us know, we may need to reduce the Lantus dose.  I will contact you with your lab results as soon as they are available.   If you have not heard from me in 2 weeks, please contact me.  The fastest way to get your results is to register for My Chart (see the instructions on the last page of this printout).

## 2015-06-20 NOTE — Progress Notes (Signed)
Patient ID: Melissa [name], female    DOB: [birth year], 54 y.o.   MRN: [id number]  PCP: Wynne Dust  Subjective:   Chief Complaint  Patient presents with  . Follow-up    pt. was in hospital wants to see Arnesha Schiraldi    HPI Presents for follow-up after recent hospitalization.  Her hospital notes and labs are reviewed. ANA, anca and ds anti-DNA antibody testing were negative.  She was admitted through the ED on 06/11/2015 with new onset diabetes, acute renal failure. She had developed pharyngitis in 02/2015 and then purpura, for which she was treated with oral prednisone, which was thought to trigger the onset of diabetes. She intends to make lifestyle changes that will allow her to stop taking the insulin prescribed to control her glucose.   She is scheduled to see Dr. Cruzita Lederer on 8/15, Diabetes Education 8/23 and Nutrition 8/29.  Plans to return to work on 07/29/2015.  She's doing well, checking her home glucose 4-6 times each day. Recognizing how foods elevate her readings.  Since 7/18, no readings >200.  Lowest reading since discharge 82. No hypoglycemia, though she carries glucose powder in her purse, just in case.  Today she is feeling good except for some headaches and poor sleep. Describes the sensation that there is popping in the sinuses, "it might be my sinuses." Next week is the anniversary of her sister's death. Trying to be strong for her mother, encouraging and uplifting her. Is moving her mother back to [place] from [facility], [place]. Not interested in anything she'd have to take every day. Thinks she needs something 2-3 nights/week.   Review of Systems  Constitutional: Negative for activity change, appetite change, fatigue and unexpected weight change.  HENT: Negative for congestion, dental problem, ear pain, hearing loss, mouth sores, postnasal drip, rhinorrhea, sneezing, sore throat, tinnitus and trouble swallowing.   Eyes: Negative for photophobia, pain,  redness and visual disturbance.  Respiratory: Negative for cough, chest tightness and shortness of breath.   Cardiovascular: Negative for chest pain, palpitations and leg swelling.  Gastrointestinal: Negative for nausea, vomiting, abdominal pain, diarrhea, constipation and blood in stool.  Genitourinary: Negative for dysuria, urgency, frequency and hematuria.  Musculoskeletal: Negative for myalgias, arthralgias, gait problem and neck stiffness.  Skin: Negative for rash.  Neurological: Positive for headaches. Negative for dizziness, speech difficulty, weakness, light-headedness and numbness.  Hematological: Negative for adenopathy.  Psychiatric/Behavioral: Positive for sleep disturbance. Negative for confusion. The patient is not nervous/anxious.        Patient Active Problem List   Diagnosis Date Noted  . Hypertension 06/11/2015  . Diabetes 1.5, managed as type 2 06/11/2015  . Acute renal failure 06/11/2015  . Idiopathic cirrhosis 06/11/2015  . History of esophageal varices 12/13/2014  . Colon cancer screening 12/13/2014  . 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 HTN (hypertension) 05/07/2012  . GERD (gastroesophageal reflux disease) 05/07/2012  . AR (allergic rhinitis) 05/07/2012  . BMI 40.0-44.9, adult 05/07/2012  . Hepatic cirrhosis 11/25/2009  . SARCOIDOSIS 11/10/2009  . CARDIAC MURMUR 11/10/2009     Prior to Admission medications   Medication Sig Start Date End Date Taking? Authorizing Provider  benzonatate (TESSALON) 200 MG capsule Take 200 mg by mouth daily as needed. For coughing 03/13/15  Yes Historical Provider, MD  budesonide (RHINOCORT AQUA) 32 MCG/ACT nasal spray Place 1 spray into both nostrils 2 (two) times daily. 06/05/15  Yes Dorian Heckle English, PA  cetirizine (ZYRTEC) 10 MG tablet Take 1 tablet (10 mg total) by  mouth daily. 06/05/15  Yes Dorian Heckle English, PA  cyclobenzaprine (FLEXERIL) 10 MG tablet TAKE 1 TABLET (10 MG TOTAL) BY MOUTH AT BEDTIME. 10/28/14  Yes Arneisha Kincannon, PA-C  esomeprazole (NEXIUM) 40 MG capsule Take 1 capsule (40 mg total) by mouth 1 day or 1 dose. 09/29/14  Yes Brailyn Delman, PA-C  glucose monitoring kit (FREESTYLE) monitoring kit 1 each by Does not apply route 4 (four) times daily - after meals and at bedtime. 1 month Diabetic Testing Supplies for QAC-QHS accuchecks.Any brand OK. DX- E11.65 06/13/15  Yes Thurnell Lose, MD  Guaifenesin [facility] MAXIMUM STRENGTH) 1200 MG TB12 Take 1 tablet (1,200 mg total) by mouth every 12 (twelve) hours as needed. 06/05/15  Yes Stephanie D English, PA  insulin aspart (NOVOLOG) 100 UNIT/ML injection Take 4 units with each meal Plus extra dose based on this scale 140-199 - 2 units, 200-250 - 4 units, 251-299 - 8 units,  300-349 - 10 units,  350 or above 12 units. Dispense syringes and needles as needed, Ok to switch to PEN if approved. Substitute to any brand approved. DX DM2, Code E11.65 06/13/15  Yes Thurnell Lose, MD  insulin glargine (LANTUS) 100 UNIT/ML injection Inject 0.45 mLs (45 Units total) into the skin at bedtime. Dispense insulin pen if approved, if not dispense as needed syringes and needles for 1 month supply. Can switch to Levemir. Diagnosis E 11.65. 06/13/15  Yes Thurnell Lose, MD  ipratropium (ATROVENT) 0.06 % nasal spray Place 2 sprays into both nostrils 4 (four) times daily. Patient taking differently: Place 2 sprays into both nostrils 3 (three) times daily.  03/13/15  Yes Gregor Hams, MD  montelukast (SINGULAIR) 10 MG tablet Take 1 tablet (10 mg total) by mouth at bedtime. 09/29/14  Yes Larisha Vencill, PA-C  nadolol (CORGARD) 40 MG tablet TAKE 1 TABLET (40 MG TOTAL) BY MOUTH DAILY. 09/29/14  Yes Davey Bergsma, PA-C  nystatin (MYCOSTATIN) 100000 UNIT/ML suspension Use as directed 5 mLs in the mouth or throat 4 (four) times daily.   Yes Historical Provider, MD  Nystatin POWD 5 mLs by Does not apply route 4 (four) times daily. 64m swish and swallow 06/06/15 06/20/15 Yes Stephanie D English, PA   ondansetron (ZOFRAN-ODT) 8 MG disintegrating tablet Take 1 tablet (8 mg total) by mouth every 8 (eight) hours as needed for nausea. 06/05/15  Yes Stephanie D English, PA  triamterene-hydrochlorothiazide (MAXZIDE-25) 37.5-25 MG per tablet TAKE 1 TABLET BY MOUTH DAILY. 09/29/14  Yes CHarrison Mons PA-C     Allergies  Allergen Reactions  . Codeine Nausea And Vomiting  . Erythromycin Nausea And Vomiting  . Ketek [Telithromycin] Nausea And Vomiting  . Oxycodone-Acetaminophen Nausea And Vomiting  . Penicillins Nausea And Vomiting  . Propoxyphene N-Acetaminophen Nausea And Vomiting  . Sulfonamide Derivatives Nausea And Vomiting  . Vicodin [Hydrocodone-Acetaminophen] Nausea And Vomiting       Objective:  Physical Exam  Constitutional: She is oriented to person, place, and time. She appears well-developed and well-nourished. No distress.  BP 136/80 mmHg  Pulse 74  Temp(Src) 98 F (36.7 C) (Oral)  Resp 16  Ht _0  (1.575 m)  Wt 232 lb 9.6 oz (105.507 kg)  BMI 42.53 kg/m2  SpO2 98%   Eyes: Conjunctivae are normal. No scleral icterus.  Neck: No thyromegaly present.  Cardiovascular: Normal rate, regular rhythm, normal heart sounds and intact distal pulses.   Pulmonary/Chest: Effort normal and breath sounds normal.  Lymphadenopathy:    She has  no cervical adenopathy.  Neurological: She is alert and oriented to person, place, and time. No cranial nerve deficit. Coordination normal.  Skin: Skin is warm and dry. Bruising (resolving on all 4 extremities) noted.  Psychiatric: She has a normal mood and affect. Her behavior is normal.   Results for orders placed or performed in visit on 06/20/15  POCT glucose (manual entry)  Result Value Ref Range   POC Glucose 166 (A) 70 - 99 mg/dl       Assessment & Plan:   1. Diabetes 1.5, managed as type 2 Proceed with endocrinology visit, Diabetes Education and Nutrition counseling. - POCT glucose (manual entry) - Microalbumin, urine - Lipid  panel - HM Diabetes Foot Exam  2. Essential hypertension Controlled. Will need a low-dose ACEI pending results of CMET.  3. Acute renal failure, unspecified acute renal failure type Await labs. - Comprehensive metabolic panel  4. Screening for HIV (human immunodeficiency virus) - HIV antibody   Fara Chute, PA-C Physician Assistant-Certified [facility] to RTW 8/30, after her specialty follow-up. I'll expect her FMLA paperwork.

## 2015-06-21 LAB — HIV ANTIBODY (ROUTINE TESTING W REFLEX): HIV: NONREACTIVE

## 2015-06-21 LAB — MICROALBUMIN, URINE: MICROALB UR: 36.8 mg/dL — AB (ref <2.0)

## 2015-06-23 LAB — HM DIABETES EYE EXAM

## 2015-07-01 ENCOUNTER — Encounter: Payer: Self-pay | Admitting: Physician Assistant

## 2015-07-02 ENCOUNTER — Other Ambulatory Visit: Payer: Self-pay | Admitting: Internal Medicine

## 2015-07-02 ENCOUNTER — Telehealth: Payer: Self-pay

## 2015-07-02 NOTE — Telephone Encounter (Signed)
FMLA and Fitness for Duty forms completed and signed and returned to box at the nurse's desk.

## 2015-07-02 NOTE — Telephone Encounter (Signed)
Patient brought in FMLA paperwork to be completed by Chelle. I have filled out what I can and highlighted the areas to be completed. Please look over, sign and return to FMLA/Disability box at checkout at 102. Placing in Chelle's box on 07/02/15.

## 2015-07-03 NOTE — Telephone Encounter (Signed)
Scanned completed forms and faxed them to Mickeal Skinner at [phone]

## 2015-07-11 ENCOUNTER — Other Ambulatory Visit: Payer: Self-pay | Admitting: Internal Medicine

## 2015-07-14 ENCOUNTER — Ambulatory Visit (INDEPENDENT_AMBULATORY_CARE_PROVIDER_SITE_OTHER): Payer: 59 | Admitting: Internal Medicine

## 2015-07-14 ENCOUNTER — Encounter: Payer: Self-pay | Admitting: Internal Medicine

## 2015-07-14 ENCOUNTER — Other Ambulatory Visit: Payer: Self-pay | Admitting: Internal Medicine

## 2015-07-14 VITALS — BP 116/72 | HR 72 | Temp 98.7°F | Resp 12 | Ht 62.5 in | Wt 227.0 lb

## 2015-07-14 DIAGNOSIS — E1165 Type 2 diabetes mellitus with hyperglycemia: Secondary | ICD-10-CM

## 2015-07-14 MED ORDER — METFORMIN HCL 500 MG PO TABS: 1000.0000 mg | ORAL_TABLET | Freq: Two times a day (BID) | ORAL | Status: DC

## 2015-07-14 MED ORDER — INSULIN PEN NEEDLE 32G X 4 MM MISC: Status: DC

## 2015-07-14 MED ORDER — GLUCOSE BLOOD VI STRP: ORAL_STRIP | Status: DC

## 2015-07-14 MED ORDER — INSULIN DETEMIR 100 UNIT/ML FLEXPEN: 35.0000 [IU] | PEN_INJECTOR | Freq: Every day | SUBCUTANEOUS | Status: DC

## 2015-07-14 MED ORDER — INSULIN GLARGINE 100 UNIT/ML SOLOSTAR PEN: 35.0000 [IU] | PEN_INJECTOR | Freq: Every day | SUBCUTANEOUS | Status: DC

## 2015-07-14 NOTE — Patient Instructions (Addendum)
Please switch to Levemir and decrease the dose to 35 units in am. If your sugars are still as good in 5 days, decrease further to 30 units. Continue to decrease by 5 units every 5 days until you come off Levemir.  Stay off NovoLog.  Please start Metformin 500 mg with dinner x 3 days. If you tolerate this well, add another Metformin tablet (500 mg) with breakfast x 3 days. If you tolerate this well, add another metformin tablet with dinner (total 1000 mg) x 5 days. If you tolerate this well, add another metformin tablet with breakfast (total 1000 mg). Continue with 1000 mg of metformin 2x a day with breakfast and dinner.  Please return in 2 months with your sugar log.   PATIENT INSTRUCTIONS FOR TYPE 2 DIABETES:  **Please join MyChart!** - see attached instructions about how to join if you have not done so already.  DIET AND EXERCISE Diet and exercise is an important part of diabetic treatment.  We recommended aerobic exercise in the form of brisk walking (working between 40-60% of maximal aerobic capacity, similar to brisk walking) for 150 minutes per week (such as 30 minutes five days per week) along with 3 times per week performing 'resistance' training (using various gauge rubber tubes with handles) 5-10 exercises involving the major muscle groups (upper body, lower body and core) performing 10-15 repetitions (or near fatigue) each exercise. Start at half the above goal but build slowly to reach the above goals. If limited by weight, joint pain, or disability, we recommend daily walking in a swimming pool with water up to waist to reduce pressure from joints while allow for adequate exercise.    BLOOD GLUCOSES Monitoring your blood glucoses is important for continued management of your diabetes. Please check your blood glucoses 2-4 times a day: fasting, before meals and at bedtime (you can rotate these measurements - e.g. one day check before the 3 meals, the next day check before 2 of the meals  and before bedtime, etc.).   HYPOGLYCEMIA (low blood sugar) Hypoglycemia is usually a reaction to not eating, exercising, or taking too much insulin/ other diabetes drugs.  Symptoms include tremors, sweating, hunger, confusion, headache, etc. Treat IMMEDIATELY with 15 grams of Carbs: . 4 glucose tablets .  cup regular juice/soda . 2 tablespoons raisins . 4 teaspoons sugar . 1 tablespoon honey Recheck blood glucose in 15 mins and repeat above if still symptomatic/blood glucose <100.  RECOMMENDATIONS TO REDUCE YOUR RISK OF DIABETIC COMPLICATIONS: * Take your prescribed MEDICATION(S) * Follow a DIABETIC diet: Complex carbs, fiber rich foods, (monounsaturated and polyunsaturated) fats * AVOID saturated/trans fats, high fat foods, >2,300 mg salt per day. * EXERCISE at least 5 times a week for 30 minutes or preferably daily.  * DO NOT SMOKE OR DRINK more than 1 drink a day. * Check your FEET every day. Do not wear tightfitting shoes. Contact us if you develop an ulcer * See your EYE doctor once a year or more if needed * Get a FLU shot once a year * Get a PNEUMONIA vaccine once before and once after age 65 years  GOALS:  * Your Hemoglobin A1c of <7%  * fasting sugars need to be <130 * after meals sugars need to be <180 (2h after you start eating) * Your Systolic BP should be 140 or lower  * Your Diastolic BP should be 80 or lower  * Your HDL (Good Cholesterol) should be 40 or higher  * Your  LDL (Bad Cholesterol) should be 100 or lower. * Your Triglycerides should be 150 or lower  * Your Urine microalbumin (kidney function) should be <30 * Your Body Mass Index should be 25 or lower    Please consider the following ways to cut down carbs and fat and increase fiber and micronutrients in your diet: - substitute whole grain for white bread or pasta - substitute brown rice for white rice - substitute 90-calorie flat bread pieces for slices of bread when possible - substitute sweet  potatoes or yams for white potatoes - substitute humus for margarine - substitute tofu for cheese when possible - substitute almond or rice milk for regular milk (would not drink soy milk daily due to concern for soy estrogen influence on breast cancer risk) - substitute dark chocolate for other sweets when possible - substitute water - can add lemon or orange slices for taste - for diet sodas (artificial sweeteners will trick your body that you can eat sweets without getting calories and will lead you to overeating and weight gain in the long run) - do not skip breakfast or other meals (this will slow down the metabolism and will result in more weight gain over time)  - can try smoothies made from fruit and almond/rice milk in am instead of regular breakfast - can also try old-fashioned (not instant) oatmeal made with almond/rice milk in am - order the dressing on the side when eating salad at a restaurant (pour less than half of the dressing on the salad) - eat as little meat as possible - can try juicing, but should not forget that juicing will get rid of the fiber, so would alternate with eating raw veg./fruits or drinking smoothies - use as little oil as possible, even when using olive oil - can dress a salad with a mix of balsamic vinegar and lemon juice, for e.g. - use agave nectar, stevia sugar, or regular sugar rather than artificial sweateners - steam or broil/roast veggies  - snack on veggies/fruit/nuts (unsalted, preferably) when possible, rather than processed foods - reduce or eliminate aspartame in diet (it is in diet sodas, chewing gum, etc) Read the labels!  Try to read Dr. Katherina Right book: "Program for Reversing Diabetes" for other ideas for healthy eating.

## 2015-07-14 NOTE — Progress Notes (Addendum)
Patient ID: Melissa [name], female   DOB: 11-Jun-1961, 54 y.o.   MRN: [id number]  HPI: Melissa [name] is a 54 y.o.-year-old female, referred by her PCP, JEFFERY,CHELLE, PA-C, for management of DM2, dx in 06/11/2015 (steroid-induced - for purpura). She was on steroid for ~ 1 months.   Patient was just diagnosed with type 2 diabetes and she also has an appointment to see diabetes education and then nutrition later this month.  Last hemoglobin A1c was: Lab Results  Component Value Date   HGBA1C 14.5* 06/12/2015   HGBA1C 5.5 12/28/2013   Pt is on a regimen of: - Lantus 45 units in am  She stopped:  - Novolog 4 + SSI units 3x a day, before meals  Pt checks her sugars 2x a day and they are: - am: 101-120 - 2h after b'fast: n/c - before lunch: 111 - 2h after lunch: 69-119 - before dinner: 86-105 - 2h after dinner: n/c - bedtime: 62, 91-126 - nighttime: n/c No lows. Lowest sugar was 62; ? hypoglycemia awareness Highest sugar was 126.  Glucometer: Free Style  Pt's meals are: - Breakfast: cereal or eggs + cheese + toast, boiled egg or oatmeal - Lunch: baked potato, pineapple or sweet potato + cinnamon or soup or cheeseburger + fries - Dinner: chicken pot pie; chicken/fish + mac and cheese + corn bread; beef ribs + potato salad + collard greens - Snacks: chocolate pudding, apples, watermelon, cantaloupe, graham crackers, nuts (sea salt light)  Exercise: treadmill, bike 1-2x weekly  - no CKD, last BUN/creatinine:  Lately improved - per labs by nephrology Lab Results  Component Value Date   BUN 13 06/20/2015   CREATININE 1.05 06/20/2015  She is not on an ACE inhibitor. - last set of lipids: Lab Results  Component Value Date   CHOL 165 06/20/2015   HDL 45* 06/20/2015   LDLCALC 101* 06/20/2015   TRIG 94 06/20/2015   CHOLHDL 3.7 06/20/2015  She is not on a statin. - last eye exam was in 06/23/2015. No DR.  - no numbness and tingling in her feet. Foot exam by PCP on  06/20/2015 was normal  Pt has FH of DM in 2 brothers. She also has a h/o HTN.  ROS: Constitutional: + weight gain, no fatigue, no subjective hyperthermia/hypothermia, + excessive urination, + nocturia Eyes: + blurry vision, no xerophthalmia ENT: + sore throat, no nodules palpated in throat, no dysphagia/odynophagia, no hoarseness Cardiovascular: no CP/SOB/palpitations/leg swelling Respiratory: no cough/SOB Gastrointestinal: no N/V/D/C Musculoskeletal: no muscle/joint aches Skin: + rash Neurological: + tremors/no numbness/tingling/dizziness Psychiatric: no depression/anxiety  Past Medical History  Diagnosis Date  . Allergy     sinus allergies  . GERD (gastroesophageal reflux disease)   . Heart murmur   . Hypertension   . Esophageal varices   . Chronic kidney disease (CKD), stage III (moderate)     Archie Endo 06/11/2015  . Diabetes mellitus without complication dx'd 0/76/2263    Archie Endo 06/11/2015   Past Surgical History  Procedure Laterality Date  . Cesarean section  1979  . Carpal tunnel release Right 1987  . Reduction mammaplasty Bilateral 2005  . Abdominal hysterectomy  1997    "partial"  . Tubal ligation  1981   Social History   Social History  . Marital Status: Married    Spouse Name: Ethelreda Sukhu, Brooke Bonito  . Number of Children: 1  . Years of Education: 14+   Occupational History  . 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 Smoking status: Never Smoker   . Smokeless tobacco: Never Used  . Alcohol Use: No  . Drug Use: No  . Sexual Activity:    Partners: Male    Birth Control/ Protection: Surgical   Other Topics Concern  . Not on file   Social History Narrative   Lives with her husband and two dogs. Her son is a Engineer, agricultural in [place], [place]. She feels like the grandmother to her sister's 2 children, since the sister's sudden and unexpected death in 2014/06/29.   Current Outpatient Prescriptions on File Prior to  Visit  Medication Sig Dispense Refill  . benzonatate (TESSALON) 200 MG capsule Take 200 mg by mouth daily as needed. For coughing  0  . budesonide (RHINOCORT AQUA) 32 MCG/ACT nasal spray Place 1 spray into both nostrils 2 (two) times daily. 8.3 mL 0  . cetirizine (ZYRTEC) 10 MG tablet Take 1 tablet (10 mg total) by mouth daily. 30 tablet 5  . cyclobenzaprine (FLEXERIL) 10 MG tablet TAKE 1 TABLET (10 MG TOTAL) BY MOUTH AT BEDTIME. 30 tablet 4  . esomeprazole (NEXIUM) 40 MG capsule Take 1 capsule (40 mg total) by mouth 1 day or 1 dose. 90 capsule 3  . glucose monitoring kit (FREESTYLE) monitoring kit 1 each by Does not apply route 4 (four) times daily - after meals and at bedtime. 1 month Diabetic Testing Supplies for QAC-QHS accuchecks.Any brand OK. DX- E11.65 1 each 1  . Guaifenesin (MUCINEX MAXIMUM STRENGTH) 1200 MG TB12 Take 1 tablet (1,200 mg total) by mouth every 12 (twelve) hours as needed. 14 tablet 1  . insulin aspart (NOVOLOG) 100 UNIT/ML injection Take 4 units with each meal Plus extra dose based on this scale 140-199 - 2 units, 200-250 - 4 units, 251-299 - 8 units,  300-349 - 10 units,  350 or above 12 units. Dispense syringes and needles as needed, Ok to switch to PEN if approved. Substitute to any brand approved. DX DM2, Code E11.65 1 vial 12  . insulin glargine (LANTUS) 100 UNIT/ML injection Inject 0.45 mLs (45 Units total) into the skin at bedtime. Dispense insulin pen if approved, if not dispense as needed syringes and needles for 1 month supply. Can switch to Levemir. Diagnosis E 11.65. 10 mL 0  . ipratropium (ATROVENT) 0.06 % nasal spray Place 2 sprays into both nostrils 4 (four) times daily. (Patient taking differently: Place 2 sprays into both nostrils 3 (three) times daily. ) 15 mL 1  . montelukast (SINGULAIR) 10 MG tablet Take 1 tablet (10 mg total) by mouth at bedtime. 90 tablet 3  . nadolol (CORGARD) 40 MG tablet TAKE 1 TABLET (40 MG TOTAL) BY MOUTH DAILY. 90 tablet 3  .  nystatin (MYCOSTATIN) 100000 UNIT/ML suspension Use as directed 5 mLs in the mouth or throat 4 (four) times daily.    . ondansetron (ZOFRAN-ODT) 8 MG disintegrating tablet Take 1 tablet (8 mg total) by mouth every 8 (eight) hours as needed for nausea. 30 tablet 0  . triamterene-hydrochlorothiazide (MAXZIDE-25) 37.5-25 MG per tablet TAKE 1 TABLET BY MOUTH DAILY. 90 tablet 3   No current facility-administered medications on file prior to visit.   Allergies  Allergen Reactions  . Codeine Nausea And Vomiting  . Erythromycin Nausea And Vomiting  . Ketek [Telithromycin] Nausea And Vomiting  . Oxycodone-Acetaminophen Nausea And Vomiting  . Penicillins Nausea And Vomiting  . Propoxyphene N-Acetaminophen Nausea And Vomiting  . Sulfonamide Derivatives  Nausea And Vomiting  . Vicodin [Hydrocodone-Acetaminophen] Nausea And Vomiting   Family History  Problem Relation Age of Onset  . Colon cancer Neg Hx   . Esophageal cancer Neg Hx   . Rectal cancer Neg Hx   . Stomach cancer Neg Hx   . Hypertension Mother   . Hypertension Brother   . Kidney disease Brother     ESRD on Dialysis  . Diabetes Brother   . CAD Sister    PE: BP 116/72 mmHg  Pulse 72  Temp(Src) 98.7 F (37.1 C) (Oral)  Resp 12  Ht 5' 2.5" (1.588 m)  Wt 227 lb (102.967 kg)  BMI 40.83 kg/m2  SpO2 97% Wt Readings from Last 3 Encounters:  07/14/15 227 lb (102.967 kg)  06/20/15 232 lb 9.6 oz (105.507 kg)  06/11/15 223 lb 12.8 oz (101.515 kg)   Constitutional: overweight, in NAD Eyes: PERRLA, EOMI, no exophthalmos ENT: moist mucous membranes, no thyromegaly, no cervical lymphadenopathy Cardiovascular: RRR, No MRG Respiratory: CTA B Gastrointestinal: abdomen soft, NT, ND, BS+ Musculoskeletal: no deformities, strength intact in all 4 Skin: moist, warm, + healing macular rash on legs (purpura) Neurological: no tremor with outstretched hands, DTR normal in all 4  ASSESSMENT: 1. DM2, new dx  PLAN:  1. Patient with new dx of  diabetes, on a large dose of basal insulin, with greatly improved control in last 2 weeks >> we can decreased the dose of Lantus and replace it with Metformin. - I suggested to:  Patient Instructions  Please switch to Levemir and decrease the dose to 35 units in am. If your sugars are still as good in 5 days, decrease further to 30 units. Continue to decrease by 5 units every 5 days until you come off Levemir.  Stay off NovoLog.  Please start Metformin 500 mg with dinner x 3 days. If you tolerate this well, add another Metformin tablet (500 mg) with breakfast x 3 days. If you tolerate this well, add another metformin tablet with dinner (total 1000 mg) x 5 days. If you tolerate this well, add another metformin tablet with breakfast (total 1000 mg). Continue with 1000 mg of metformin 2x a day with breakfast and dinner.  Please return in 2 months with your sugar log.   - Strongly advised her to start checking sugars at different times of the day - check 1-2 times a day, rotating checks - given sugar log and advised how to fill it and to bring it at next appt  - given foot care handout and explained the principles  - given instructions for hypoglycemia management "15-15 rule"  - advised for yearly eye exams >> she is UTD - advised her to keep the appt with DM edu and nutrition - Return to clinic in 2 mo with sugar log >> will check HbA1c then

## 2015-07-22 ENCOUNTER — Ambulatory Visit: Payer: 59 | Admitting: *Deleted

## 2015-07-22 ENCOUNTER — Telehealth: Payer: Self-pay | Admitting: Physician Assistant

## 2015-07-22 NOTE — Telephone Encounter (Signed)
Patient states that she has been on FMLA for the past couple of weeks. It is time for her to return to work and she needs a letter stating that she is ok to go back on 07/29/2015 (this date is in Chelle's OV notes from her visit on 06/20/2015). I asked her if any limitations/restricitions need to be outlined in the letter. She states that whatever Chelle feels is best to put it in the letter. She will return to clinic on 08/05/2015 for a follow up. Last, patient wants this letter mailed to her home.   [phone]

## 2015-07-22 NOTE — Telephone Encounter (Signed)
Okay to write letter? Any special requests for letter?

## 2015-07-22 NOTE — Telephone Encounter (Signed)
Letter written. Printed at 104. Will bring to 102 after clinic.

## 2015-07-27 ENCOUNTER — Telehealth: Payer: Self-pay | Admitting: Family Medicine

## 2015-07-27 NOTE — Telephone Encounter (Signed)
Spoke with patient about letter and its waiting for her at the 102 building

## 2015-08-05 ENCOUNTER — Ambulatory Visit (INDEPENDENT_AMBULATORY_CARE_PROVIDER_SITE_OTHER): Payer: 59 | Admitting: Physician Assistant

## 2015-08-05 ENCOUNTER — Encounter: Payer: Self-pay | Admitting: Physician Assistant

## 2015-08-05 VITALS — BP 128/74 | HR 78 | Temp 98.0°F | Resp 16 | Ht 63.0 in | Wt 222.2 lb

## 2015-08-05 DIAGNOSIS — K0889 Other specified disorders of teeth and supporting structures: Secondary | ICD-10-CM

## 2015-08-05 DIAGNOSIS — Z23 Encounter for immunization: Secondary | ICD-10-CM | POA: Diagnosis not present

## 2015-08-05 DIAGNOSIS — I10 Essential (primary) hypertension: Secondary | ICD-10-CM | POA: Diagnosis not present

## 2015-08-05 DIAGNOSIS — N179 Acute kidney failure, unspecified: Secondary | ICD-10-CM

## 2015-08-05 DIAGNOSIS — E1165 Type 2 diabetes mellitus with hyperglycemia: Secondary | ICD-10-CM

## 2015-08-05 DIAGNOSIS — K088 Other specified disorders of teeth and supporting structures: Secondary | ICD-10-CM

## 2015-08-05 LAB — COMPREHENSIVE METABOLIC PANEL
ALK PHOS: 77 U/L (ref 33–130)
ALT: 23 U/L (ref 6–29)
AST: 35 U/L (ref 10–35)
Albumin: 3.9 g/dL (ref 3.6–5.1)
BILIRUBIN TOTAL: 1.1 mg/dL (ref 0.2–1.2)
BUN: 20 mg/dL (ref 7–25)
CALCIUM: 10.1 mg/dL (ref 8.6–10.4)
CO2: 27 mmol/L (ref 20–31)
Chloride: 106 mmol/L (ref 98–110)
Creat: 1.24 mg/dL — ABNORMAL HIGH (ref 0.50–1.05)
Glucose, Bld: 102 mg/dL — ABNORMAL HIGH (ref 65–99)
Potassium: 3.6 mmol/L (ref 3.5–5.3)
Sodium: 144 mmol/L (ref 135–146)
TOTAL PROTEIN: 7.1 g/dL (ref 6.1–8.1)

## 2015-08-05 MED ORDER — LISINOPRIL 2.5 MG PO TABS: 2.5000 mg | ORAL_TABLET | Freq: Every day | ORAL | Status: DC

## 2015-08-05 MED ORDER — DOXYCYCLINE HYCLATE 100 MG PO CAPS: 100.0000 mg | ORAL_CAPSULE | Freq: Two times a day (BID) | ORAL | Status: AC

## 2015-08-05 MED ORDER — ATORVASTATIN CALCIUM 10 MG PO TABS: 10.0000 mg | ORAL_TABLET | Freq: Every day | ORAL | Status: DC

## 2015-08-05 NOTE — Progress Notes (Signed)
Patient ID: Melissa [name], female    DOB: [birth year], 54 y.o.   MRN: [id number]  PCP: Wynne [facility]  Subjective:   Chief Complaint  Patient presents with  . Follow-up    blood work  potassium recheck    HPI Presents for evaluation of HTN, DM and elevated potassium at her last visit.  Please recall that she was recently diagnosed with diabetes, thought to be induced by steroid therapy for purpura.  Has seen endocrinology. ACEI, statin not started, but switched insulins, then tapered off. Glucose readings have all been controlled. Eating is getting easier, but still hard. She's pleased that she is losing weight even though she's eating more. Vision has dramatically improved with glucose control. Will see DDS this afternoon. Is to take one dose of doxycyline prior to procedure to replace a filling that fell out.   Review of Systems  Constitutional: Negative.   HENT: Positive for dental problem. Negative for congestion, drooling, ear pain, postnasal drip, rhinorrhea, sinus pressure, sore throat, tinnitus and trouble swallowing.   Eyes: Positive for visual disturbance. Negative for pain.  Respiratory: Negative for cough, chest tightness and shortness of breath.   Cardiovascular: Negative for chest pain, palpitations and leg swelling.  Gastrointestinal: Negative for nausea, vomiting, abdominal pain, diarrhea and constipation.  Endocrine: Negative.   Genitourinary: Negative for dysuria, urgency, frequency and hematuria.  Musculoskeletal: Negative for myalgias and arthralgias.  Skin: Positive for rash (purpura lesions are nearly resolved).  Allergic/Immunologic: Negative.   Neurological: Negative for dizziness, weakness, light-headedness and headaches.  Hematological: Negative.   Psychiatric/Behavioral: Negative for suicidal ideas, sleep disturbance, self-injury, dysphoric mood and decreased concentration. The patient is not nervous/anxious.        Patient Active  Problem List   Diagnosis Date Noted  . Type 2 diabetes mellitus with hyperglycemia 07/14/2015  . Acute renal failure 06/11/2015  . History of esophageal varices 12/13/2014  . HTN (hypertension) 05/07/2012  . GERD (gastroesophageal reflux disease) 05/07/2012  . AR (allergic rhinitis) 05/07/2012  . BMI 40.0-44.9, adult 05/07/2012  . Hepatic cirrhosis 11/25/2009  . SARCOIDOSIS 11/10/2009  . CARDIAC MURMUR 11/10/2009     Prior to Admission medications   Medication Sig Start Date End Date Taking? Authorizing Provider  cetirizine (ZYRTEC) 10 MG tablet Take 1 tablet (10 mg total) by mouth daily. 06/05/15  Yes Dorian Heckle English, PA  esomeprazole (NEXIUM) 40 MG capsule Take 1 capsule (40 mg total) by mouth 1 day or 1 dose. 09/29/14  Yes Caralyn Twining, PA-C  glucose blood (FREESTYLE TEST STRIPS) test strip Use 2x a day 07/14/15  Yes Philemon Kingdom, MD  glucose monitoring kit (FREESTYLE) monitoring kit 1 each by Does not apply route 4 (four) times daily - after meals and at bedtime. 1 month Diabetic Testing Supplies for QAC-QHS accuchecks.Any brand OK. DX- E11.65 06/13/15  Yes Thurnell Lose, MD  metFORMIN (GLUCOPHAGE) 500 MG tablet Take 2 tablets (1,000 mg total) by mouth 2 (two) times daily with a meal. 07/14/15  Yes Philemon Kingdom, MD  nadolol (CORGARD) 40 MG tablet TAKE 1 TABLET (40 MG TOTAL) BY MOUTH DAILY. 09/29/14  Yes Dacie Mandel, PA-C  triamterene-hydrochlorothiazide (MAXZIDE-25) 37.5-25 MG per tablet TAKE 1 TABLET BY MOUTH DAILY. 09/29/14  Yes Harrison Mons, PA-C     Allergies  Allergen Reactions  . Codeine Nausea And Vomiting  . Erythromycin Nausea And Vomiting  . Ketek [Telithromycin] Nausea And Vomiting  . Oxycodone-Acetaminophen Nausea And Vomiting  . Penicillins Nausea And Vomiting  .  Propoxyphene N-Acetaminophen Nausea And Vomiting  . Sulfonamide Derivatives Nausea And Vomiting  . Vicodin [Hydrocodone-Acetaminophen] Nausea And Vomiting       Objective:  Physical  Exam  Constitutional: She is oriented to person, place, and time. She appears well-developed and well-nourished. No distress.  BP 128/74 mmHg  Pulse 78  Temp(Src) 98 F (36.7 C) (Oral)  Resp 16  Ht 5' 3"  (1.6 m)  Wt 222 lb 3.2 oz (100.789 kg)  BMI 39.37 kg/m2  SpO2 98%   Eyes: Conjunctivae are normal. No scleral icterus.  Neck: No thyromegaly present.  Cardiovascular: Normal rate, regular rhythm, normal heart sounds and intact distal pulses.   Pulmonary/Chest: Effort normal and breath sounds normal.  Lymphadenopathy:    She has no cervical adenopathy.  Neurological: She is alert and oriented to person, place, and time.  Skin: Skin is warm and dry.  Psychiatric: She has a normal mood and affect. Her speech is normal and behavior is normal.           Assessment & Plan:   1. Essential hypertension Well controlled. Continue current treatment. Add ACEI for DM. See below.  2. Type 2 diabetes mellitus with hyperglycemia Controlled. Add very low dose ACEI and low-dose statin. Follow-up with Dr. Cruzita Lederer in October, as planned.  - lisinopril (PRINIVIL,ZESTRIL) 2.5 MG tablet; Take 1 tablet (2.5 mg total) by mouth daily.  Dispense: 90 tablet; Refill: 3 - atorvastatin (LIPITOR) 10 MG tablet; Take 1 tablet (10 mg total) by mouth daily.  Dispense: 90 tablet; Refill: 3  3. Acute renal failure, unspecified acute renal failure type Expect normalization of electrolytes and renal function. - Comprehensive metabolic panel  4. Need for influenza vaccination Given.  5. Pain, dental See dentist this afternoon as planned. - doxycycline (VIBRAMYCIN) 100 MG capsule; Take 1 capsule (100 mg total) by mouth 2 (two) times daily.  Dispense: 20 capsule; Refill: 0  Return in about 3 months (around 11/04/2015) for re-evaluation of blood pressure and cholesterol.   Fara Chute, PA-C Physician Assistant-Certified [facility]

## 2015-08-05 NOTE — Patient Instructions (Addendum)
Lab Results  Component Value Date   CHOL 165 06/20/2015   HDL 45* 06/20/2015   LDLCALC 101* 06/20/2015   TRIG 94 06/20/2015   CHOLHDL 3.7 06/20/2015   I will contact you with your lab results as soon as they are available.   If you have not heard from me in 2 weeks, please contact me.  The fastest way to get your results is to register for My Chart (see the instructions on the last page of this printout).  Keep up the GREAT work!

## 2015-08-06 ENCOUNTER — Encounter: Payer: Self-pay | Admitting: Physician Assistant

## 2015-08-17 ENCOUNTER — Ambulatory Visit (INDEPENDENT_AMBULATORY_CARE_PROVIDER_SITE_OTHER): Payer: 59 | Admitting: Emergency Medicine

## 2015-08-17 VITALS — BP 122/94 | HR 65 | Temp 98.6°F | Resp 18 | Ht 62.0 in | Wt 218.2 lb

## 2015-08-17 DIAGNOSIS — R079 Chest pain, unspecified: Secondary | ICD-10-CM

## 2015-08-17 DIAGNOSIS — K2970 Gastritis, unspecified, without bleeding: Secondary | ICD-10-CM

## 2015-08-17 DIAGNOSIS — K2990 Gastroduodenitis, unspecified, without bleeding: Secondary | ICD-10-CM | POA: Diagnosis not present

## 2015-08-17 MED ORDER — GI COCKTAIL ~~LOC~~
30.0000 mL | Freq: Once | ORAL | Status: AC
  Administered 2015-08-17: 30 mL via ORAL

## 2015-08-17 NOTE — Patient Instructions (Signed)

## 2015-08-17 NOTE — Progress Notes (Signed)
Subjective:  Patient ID: Melissa [name], female    DOB: 09/29/1961  Age: 54 y.o. MRN: [id number]  CC: Back Pain   HPI Melissa [name] presents  with epigastric discomfort and bloating and belching that began Wednesday after starting clindamycin. She had a root canal done and that provoked her course of clindamycin. She felt as though she had a pill stuck in her throat. Vomiting. No stool change. No cough shortness breath or wheezing. She has no nasal congestion postnasal drainage or sore throat. She has no symptoms suggestive of reflux esophagitis. She has some chest discomfort with no radiation no shortness of breath. No sensation of a rapid or irregular heartbeat. She has no improvement or symptoms with over-the-counter medication. She has no fever chills  History Melissa [name] has a past medical history of Allergy; GERD (gastroesophageal reflux disease); Heart murmur; Hypertension; Esophageal varices; Chronic kidney disease (CKD), stage III (moderate); and Diabetes mellitus without complication (dx'd 0/45/9977).   She has past surgical history that includes Cesarean section (1979); Carpal tunnel release (Right, 1987); Reduction mammaplasty (Bilateral, 2005); Abdominal hysterectomy (1997); and Tubal ligation (1981).   Her  family history includes CAD in her sister; Diabetes in her brother; Hypertension in her brother and mother; Kidney disease in her brother. There is no history of Colon cancer, Esophageal cancer, Rectal cancer, or Stomach cancer.  She   reports that she has never smoked. She has never used smokeless tobacco. She reports that she does not drink alcohol or use illicit drugs.  Outpatient Prescriptions Prior to Visit  Medication Sig Dispense Refill  . atorvastatin (LIPITOR) 10 MG tablet Take 1 tablet (10 mg total) by mouth daily. 90 tablet 3  . esomeprazole (NEXIUM) 40 MG capsule Take 1 capsule (40 mg total) by mouth 1 day or 1 dose. 90 capsule 3  . glucose blood  (FREESTYLE TEST STRIPS) test strip Use 2x a day 100 each 2  . glucose monitoring kit (FREESTYLE) monitoring kit 1 each by Does not apply route 4 (four) times daily - after meals and at bedtime. 1 month Diabetic Testing Supplies for QAC-QHS accuchecks.Any brand OK. DX- E11.65 1 each 1  . Insulin Pen Needle (CAREFINE PEN NEEDLES) 32G X 4 MM MISC Use 1x a day 30 each 1  . lisinopril (PRINIVIL,ZESTRIL) 2.5 MG tablet Take 1 tablet (2.5 mg total) by mouth daily. 90 tablet 3  . metFORMIN (GLUCOPHAGE) 500 MG tablet Take 2 tablets (1,000 mg total) by mouth 2 (two) times daily with a meal. 120 tablet 1  . nadolol (CORGARD) 40 MG tablet TAKE 1 TABLET (40 MG TOTAL) BY MOUTH DAILY. 90 tablet 3  . triamterene-hydrochlorothiazide (MAXZIDE-25) 37.5-25 MG per tablet TAKE 1 TABLET BY MOUTH DAILY. 90 tablet 3  . cetirizine (ZYRTEC) 10 MG tablet Take 1 tablet (10 mg total) by mouth daily. (Patient not taking: Reported on 08/17/2015) 30 tablet 5   No facility-administered medications prior to visit.    Social History   Social History  . Marital Status: Married    Spouse Name: Melissa [name], Melissa [name]  . Number of Children: 1  . Years of Education: 14+   Occupational History  . Office Specialist     Adult Medicare   Social History Main Topics  . Smoking status: Never Smoker   . Smokeless tobacco: Never Used  . Alcohol Use: No  . Drug Use: No  . Sexual Activity:    Partners: Male    Birth Control/ Protection: Surgical   Other  Topics Concern  . None   Social History Narrative   Lives with her husband and two dogs. Her son is a Engineer, agricultural in [place], [place]. She feels like the grandmother to her sister's 2 children, since the sister's sudden and unexpected death in June 08, 2014.     Review of Systems  Constitutional: Negative for fever, chills and appetite change.  HENT: Negative for congestion, ear pain, postnasal drip, sinus pressure and sore throat.   Eyes: Negative for pain and redness.  Respiratory:  Negative for cough, shortness of breath and wheezing.   Cardiovascular: Negative for leg swelling.  Gastrointestinal: Negative for nausea, vomiting, abdominal pain, diarrhea, constipation and blood in stool.  Endocrine: Negative for polyuria.  Genitourinary: Negative for dysuria, urgency, frequency and flank pain.  Musculoskeletal: Negative for gait problem.  Skin: Negative for rash.  Neurological: Negative for weakness and headaches.  Psychiatric/Behavioral: Negative for confusion and decreased concentration. The patient is not nervous/anxious.     Objective:  BP 122/94 mmHg  Pulse 65  Temp(Src) 98.6 F (37 C) (Oral)  Resp 18  Ht 5' 2" (1.575 m)  Wt 218 lb 3.2 oz (98.975 kg)  BMI 39.90 kg/m2  SpO2 96%  Physical Exam  Constitutional: She is oriented to person, place, and time. She appears well-developed and well-nourished. No distress.  HENT:  Head: Normocephalic and atraumatic.  Right Ear: External ear normal.  Left Ear: External ear normal.  Nose: Nose normal.  Eyes: Conjunctivae and EOM are normal. Pupils are equal, round, and reactive to light. No scleral icterus.  Neck: Normal range of motion. Neck supple. No tracheal deviation present.  Cardiovascular: Normal rate, regular rhythm and normal heart sounds.   Pulmonary/Chest: Effort normal. No respiratory distress. She has no wheezes. She has no rales.  Abdominal: She exhibits no mass. There is no tenderness. There is no rebound and no guarding.  Musculoskeletal: She exhibits no edema.  Lymphadenopathy:    She has no cervical adenopathy.  Neurological: She is alert and oriented to person, place, and time. Coordination normal.  Skin: Skin is warm and dry. No rash noted.  Psychiatric: She has a normal mood and affect. Her behavior is normal.      Assessment & Plan:   Melissa [name] was seen today for back pain.  Diagnoses and all orders for this visit:  Chest pain, unspecified chest pain type -     EKG 12-Lead -     gi  cocktail (Maalox,Lidocaine,Donnatal); Take 30 mLs by mouth once.  Gastritis and gastroduodenitis   I am having Melissa [name] maintain her esomeprazole, nadolol, triamterene-hydrochlorothiazide, cetirizine, glucose monitoring kit, metFORMIN, Insulin Pen Needle, glucose blood, lisinopril, and atorvastatin. We administered gi cocktail.  Meds ordered this encounter  Medications  . gi cocktail (Maalox,Lidocaine,Donnatal)    Sig:    She was instructed to stop the clindamycin was put on Keflex she'll follow-up with her dentist as directed. She has suggested she not take clindamycin (   Appropriate red flag conditions were discussed with the patient as well as actions that should be taken.  Patient expressed his understanding.  Follow-up: Return if symptoms worsen or fail to improve.  Roselee Culver, MD

## 2015-09-01 ENCOUNTER — Telehealth: Payer: Self-pay | Admitting: Internal Medicine

## 2015-09-01 ENCOUNTER — Other Ambulatory Visit: Payer: Self-pay

## 2015-09-01 MED ORDER — GLUCOSE BLOOD VI STRP: ORAL_STRIP | Status: DC

## 2015-09-01 NOTE — Telephone Encounter (Signed)
Patient need a new prescription for test strips send to [facility] on [address]

## 2015-09-02 MED ORDER — GLUCOSE BLOOD VI STRP: ORAL_STRIP | Status: DC

## 2015-09-02 NOTE — Telephone Encounter (Signed)
Patient Name: Melissa [name] Gender: Female DOB: Jan 31, 1961 Age: 54 Y 5 M 1 D Return Phone Number: [phone] (Primary), [phone] (Secondary) Address: City/State/Zip: [place] Client [facility] Night - Client Client Site [facility] Physician Carlus Pavlov Contact Type Call Call Type Triage / Clinical Relationship To Patient Self Return Phone Number [phone] (Primary) Chief Complaint Prescription Refill or Medication Request (non symptomatic) Initial Comment Caller states that needs more test strips called in. Is completely out. Nurse Assessment Nurse: Lucianne Lei, RN, Lanora Manis Date/Time (Eastern Time): 08/31/2015 8:02:14 PM Please select the assessment type ---Refill Additional Documentation ---Patient States the pharmacy said she needs a new Rx called in for Test Strips in order for the insurance to cover it. Patient states she ran out because the doctor had her testing her blood sugar more frequently Does the patient have enough medication to last until the office opens? ---Unable to obtain loaner dose from Pharmacy

## 2015-09-02 NOTE — Telephone Encounter (Signed)
Refill sent to pt's pharmacy. 

## 2015-09-04 ENCOUNTER — Telehealth: Payer: Self-pay

## 2015-09-04 NOTE — Telephone Encounter (Signed)
Mucinex OTC will be fine. Deliah Boston, MS, PA-C   5:55 PM, 09/04/2015

## 2015-09-04 NOTE — Telephone Encounter (Signed)
Pt states that the zyrtec is not working for her and would like to know if she can take mucinex she is concerned about taking anything due the increase in her diabetes when she had a sinus infection last time

## 2015-09-05 NOTE — Telephone Encounter (Signed)
Advised pt

## 2015-09-08 ENCOUNTER — Other Ambulatory Visit: Payer: Self-pay | Admitting: Internal Medicine

## 2015-09-14 ENCOUNTER — Other Ambulatory Visit: Payer: Self-pay | Admitting: Physician Assistant

## 2015-09-15 ENCOUNTER — Ambulatory Visit (INDEPENDENT_AMBULATORY_CARE_PROVIDER_SITE_OTHER): Payer: 59 | Admitting: Internal Medicine

## 2015-09-15 ENCOUNTER — Other Ambulatory Visit (INDEPENDENT_AMBULATORY_CARE_PROVIDER_SITE_OTHER): Payer: 59 | Admitting: *Deleted

## 2015-09-15 ENCOUNTER — Encounter: Payer: Self-pay | Admitting: Internal Medicine

## 2015-09-15 VITALS — BP 108/68 | HR 67 | Temp 98.4°F | Resp 12 | Wt 212.8 lb

## 2015-09-15 DIAGNOSIS — E1165 Type 2 diabetes mellitus with hyperglycemia: Secondary | ICD-10-CM

## 2015-09-15 DIAGNOSIS — Z794 Long term (current) use of insulin: Secondary | ICD-10-CM

## 2015-09-15 LAB — POCT GLYCOSYLATED HEMOGLOBIN (HGB A1C): HEMOGLOBIN A1C: 5.1

## 2015-09-15 MED ORDER — METFORMIN HCL 500 MG PO TABS: ORAL_TABLET | ORAL | Status: DC

## 2015-09-15 MED ORDER — GLUCOSE BLOOD VI STRP: ORAL_STRIP | Status: DC

## 2015-09-15 NOTE — Progress Notes (Signed)
Patient ID: Melissa [name], female   DOB: 1961-07-11, 54 y.o.   MRN: [id number]  HPI: Melissa [name] is a 54 y.o.-year-old female,  Returning for follow-up for DM2, dx in 06/11/2015 (steroid-induced - for purpura).  Last visit 2 months ago.  Patient was recently diagnosed with type 2 diabetes  In 05/2015, after which she was started on basal-bolus insulin regimen.  At last visit, since her sugars greatly improved on this regimen, we stopped NovoLog, decreased her basal insulin , and started metformin.  Se lost 10 pounds in the last month!   Last hemoglobin A1c was: Lab Results  Component Value Date   HGBA1C 14.5* 06/12/2015   HGBA1C 5.5 12/28/2013   Pt is on a regimen of: - Metformin 1000 mg 2x a day She stopped:  - Novolog 4 + SSI units 3x a day, before meals She tapered off Lantus 45 units in am   Pt checks her sugars 2x a day and they are: - am: 101-120 >> 82-98 - 2h after b'fast: n/c >> 91 - before lunch: 111 >> 74 - 2h after lunch: 69-119 >> 81-96 - before dinner: 86-105 >> 72-95 - 2h after dinner: n/c >> 83-93 - bedtime: 62, 91-126 >> 73-94 - nighttime: n/c >> 75-89 No lows. Lowest sugar was 62 >> 72; ? hypoglycemia awareness Highest sugar was 126 >> 100.  Glucometer: Free Style  Pt's meals are: - Breakfast: cereal or eggs + cheese + toast, boiled egg or oatmeal - Lunch: baked potato, pineapple or sweet potato + cinnamon or soup or cheeseburger + fries - Dinner: chicken pot pie; chicken/fish + mac and cheese + corn bread; beef ribs + potato salad + collard greens - Snacks: chocolate pudding, apples, watermelon, cantaloupe, graham crackers, nuts (sea salt light)  Exercise: treadmill, bike 1-2x weekly  - no CKD, last BUN/creatinine:  Lately improved - per labs by nephrology Lab Results  Component Value Date   BUN 20 08/05/2015   CREATININE 1.24* 08/05/2015  She is not on an ACE inhibitor. - last set of lipids: Lab Results  Component Value Date   CHOL  165 06/20/2015   HDL 45* 06/20/2015   LDLCALC 101* 06/20/2015   TRIG 94 06/20/2015   CHOLHDL 3.7 06/20/2015  She is not on a statin. - last eye exam was in 06/23/2015. No DR.  - no numbness and tingling in her feet. Foot exam by PCP on 06/20/2015 was normal  ROS: Constitutional: + weight loss, no fatigue, no subjective hyperthermia/hypothermia, no excessive urination, no nocturia Eyes: no blurry vision, no xerophthalmia ENT: no sore throat, no nodules palpated in throat, no dysphagia/odynophagia, no hoarseness Cardiovascular: no CP/SOB/palpitations/leg swelling Respiratory: no cough/SOB Gastrointestinal: no N/V/D/C Musculoskeletal: no muscle/joint aches Skin: no rash Neurological: no tremors/no numbness/tingling/dizziness  I reviewed pt's medications, allergies, PMH, social hx, family hx, and changes were documented in the history of present illness. Otherwise, unchanged from my initial visit note.  Past Medical History  Diagnosis Date  . Allergy     sinus allergies  . GERD (gastroesophageal reflux disease)   . Heart murmur   . Hypertension   . Esophageal varices   . Chronic kidney disease (CKD), stage III (moderate)     Archie Endo 06/11/2015  . Diabetes mellitus without complication dx'd 07/23/538    Archie Endo 06/11/2015   Past Surgical History  Procedure Laterality Date  . Cesarean section  1979  . Carpal tunnel release Right 1987  . Reduction mammaplasty Bilateral 2005  . Abdominal  hysterectomy  1997    "partial"  . Tubal ligation  1981   Social History   Social History  . Marital Status: Married    Spouse Name: Katalyna Socarras, Brooke Bonito  . Number of Children: 1  . Years of Education: 14+   Occupational History  . Sales executive for [employer]     Adult Medicare   Social History Main Topics  . Smoking status: Never Smoker   . Smokeless tobacco: Never Used  . Alcohol Use: No  . Drug Use: No  . Sexual Activity:    Partners: Male    Birth Control/ Protection:  Surgical   Social History Narrative   Lives with her husband and two dogs. Her son is a Engineer, agricultural in [place], [place]. She feels like the grandmother to her sister's 2 children, since the sister's sudden and unexpected death in Jul 05, 2014.   Current Outpatient Prescriptions on File Prior to Visit  Medication Sig Dispense Refill  . atorvastatin (LIPITOR) 10 MG tablet Take 1 tablet (10 mg total) by mouth daily. 90 tablet 3  . cetirizine (ZYRTEC) 10 MG tablet Take 1 tablet (10 mg total) by mouth daily. (Patient not taking: Reported on 08/17/2015) 30 tablet 5  . esomeprazole (NEXIUM) 40 MG capsule Take 1 capsule (40 mg total) by mouth 1 day or 1 dose. 90 capsule 3  . glucose blood (FREESTYLE TEST STRIPS) test strip Use 2x a day 100 each 5  . glucose monitoring kit (FREESTYLE) monitoring kit 1 each by Does not apply route 4 (four) times daily - after meals and at bedtime. 1 month Diabetic Testing Supplies for QAC-QHS accuchecks.Any brand OK. DX- E11.65 1 each 1  . ibuprofen (ADVIL,MOTRIN) 800 MG tablet Take 800 mg by mouth every 8 (eight) hours as needed. for pain  0  . Insulin Pen Needle (CAREFINE PEN NEEDLES) 32G X 4 MM MISC Use 1x a day 30 each 1  . lisinopril (PRINIVIL,ZESTRIL) 2.5 MG tablet Take 1 tablet (2.5 mg total) by mouth daily. 90 tablet 3  . metFORMIN (GLUCOPHAGE) 500 MG tablet TAKE 2 TABLETS BY MOUTH 2 TIMES DAILY WITH A MEAL 120 tablet 1  . nadolol (CORGARD) 40 MG tablet TAKE 1 TABLET (40 MG TOTAL) BY MOUTH DAILY 90 tablet 0  . triamterene-hydrochlorothiazide (MAXZIDE-25) 37.5-25 MG tablet TAKE 1 TABLET BY MOUTH DAILY 90 tablet 0   No current facility-administered medications on file prior to visit.   Allergies  Allergen Reactions  . Codeine Nausea And Vomiting  . Erythromycin Nausea And Vomiting  . Ketek [Telithromycin] Nausea And Vomiting  . Oxycodone-Acetaminophen Nausea And Vomiting  . Penicillins Nausea And Vomiting  . Propoxyphene N-Acetaminophen Nausea And Vomiting  .  Sulfonamide Derivatives Nausea And Vomiting  . Vicodin [Hydrocodone-Acetaminophen] Nausea And Vomiting   Family History  Problem Relation Age of Onset  . Colon cancer Neg Hx   . Esophageal cancer Neg Hx   . Rectal cancer Neg Hx   . Stomach cancer Neg Hx   . Hypertension Mother   . Hypertension Brother   . Kidney disease Brother     ESRD on Dialysis  . Diabetes Brother   . CAD Sister    PE: BP 108/68 mmHg  Pulse 67  Temp(Src) 98.4 F (36.9 C) (Oral)  Resp 12  Wt 212 lb 12.8 oz (96.525 kg)  SpO2 98% Body mass index is 38.91 kg/(m^2). Wt Readings from Last 3 Encounters:  09/15/15 212 lb 12.8 oz (96.525 kg)  08/17/15 218  lb 3.2 oz (98.975 kg)  08/05/15 222 lb 3.2 oz (100.789 kg)   Constitutional: overweight, in NAD Eyes: PERRLA, EOMI, no exophthalmos ENT: moist mucous membranes, no thyromegaly, no cervical lymphadenopathy Cardiovascular: RRR, No MRG Respiratory: CTA B Gastrointestinal: abdomen soft, NT, ND, BS+ Musculoskeletal: no deformities, strength intact in all 4 Skin: moist, warm Neurological: no tremor with outstretched hands, DTR normal in all 4  ASSESSMENT: 1. DM2, new dx, non-insulin dependent, controlled  PLAN:  1. Patient with new dx of diabetes, on a large dose of basal insulin, with greatly improved control, despite stopping insulin! Her sugars are all <100, we will therefore decrease the Metformin to 500 mg 2x a day.  - I suggested to:  Patient Instructions  Please stay off insulin. Decrease the Metformin to 500 mg 2x a day with meals.  KEEP UP THE GREAT WORK!  Please come back for a follow-up appointment in 3 months.  - continue checking sugars at different times of the day - check 1 times a day, rotating checks - advised for yearly eye exams >> she is UTD - had the flu and PNA vaccine this season - check HbA1c >> 5.1% (greatly improved!) - Return to clinic in 3 mo with sugar log

## 2015-09-15 NOTE — Patient Instructions (Addendum)
Please stay off insulin. Decrease the Metformin to 500 mg 2x a day with meals.  KEEP UP THE GREAT WORK!  Please come back for a follow-up appointment in 3 months.

## 2015-10-12 ENCOUNTER — Other Ambulatory Visit: Payer: Self-pay | Admitting: Physician Assistant

## 2015-10-20 ENCOUNTER — Other Ambulatory Visit: Payer: Self-pay

## 2015-10-20 DIAGNOSIS — Z1231 Encounter for screening mammogram for malignant neoplasm of breast: Secondary | ICD-10-CM

## 2015-11-03 ENCOUNTER — Other Ambulatory Visit: Payer: Self-pay | Admitting: Internal Medicine

## 2015-11-11 ENCOUNTER — Ambulatory Visit (INDEPENDENT_AMBULATORY_CARE_PROVIDER_SITE_OTHER): Payer: 59 | Admitting: Physician Assistant

## 2015-11-11 ENCOUNTER — Encounter: Payer: Self-pay | Admitting: Physician Assistant

## 2015-11-11 VITALS — BP 130/84 | HR 61 | Temp 99.0°F | Resp 16 | Ht 62.5 in | Wt 210.4 lb

## 2015-11-11 DIAGNOSIS — J309 Allergic rhinitis, unspecified: Secondary | ICD-10-CM

## 2015-11-11 DIAGNOSIS — E785 Hyperlipidemia, unspecified: Secondary | ICD-10-CM | POA: Diagnosis not present

## 2015-11-11 DIAGNOSIS — I10 Essential (primary) hypertension: Secondary | ICD-10-CM

## 2015-11-11 DIAGNOSIS — K219 Gastro-esophageal reflux disease without esophagitis: Secondary | ICD-10-CM

## 2015-11-11 LAB — LIPID PANEL
CHOL/HDL RATIO: 2 ratio (ref <=5.0)
Cholesterol: 111 mg/dL — ABNORMAL LOW (ref 125–200)
HDL: 56 mg/dL (ref >=46)
LDL CALC: 43 mg/dL (ref <130)
Triglycerides: 59 mg/dL (ref <150)
VLDL: 12 mg/dL (ref <30)

## 2015-11-11 LAB — COMPREHENSIVE METABOLIC PANEL
ALT: 26 U/L (ref 6–29)
AST: 30 U/L (ref 10–35)
Albumin: 3.7 g/dL (ref 3.6–5.1)
Alkaline Phosphatase: 104 U/L (ref 33–130)
BUN: 25 mg/dL (ref 7–25)
CALCIUM: 9.8 mg/dL (ref 8.6–10.4)
CHLORIDE: 106 mmol/L (ref 98–110)
CO2: 24 mmol/L (ref 20–31)
Creat: 1.26 mg/dL — ABNORMAL HIGH (ref 0.50–1.05)
GLUCOSE: 83 mg/dL (ref 65–99)
POTASSIUM: 3.7 mmol/L (ref 3.5–5.3)
Sodium: 142 mmol/L (ref 135–146)
Total Bilirubin: 1 mg/dL (ref 0.2–1.2)
Total Protein: 6.8 g/dL (ref 6.1–8.1)

## 2015-11-11 MED ORDER — ESOMEPRAZOLE MAGNESIUM 40 MG PO CPDR: 40.0000 mg | DELAYED_RELEASE_CAPSULE | ORAL | Status: DC

## 2015-11-11 MED ORDER — IPRATROPIUM BROMIDE 0.06 % NA SOLN: 2.0000 | Freq: Every evening | NASAL | Status: DC | PRN

## 2015-11-11 MED ORDER — TRIAMTERENE-HCTZ 37.5-25 MG PO TABS: ORAL_TABLET | ORAL | Status: DC

## 2015-11-11 MED ORDER — NADOLOL 40 MG PO TABS: ORAL_TABLET | ORAL | Status: DC

## 2015-11-11 NOTE — Patient Instructions (Signed)
I will contact you with your lab results as soon as they are available.   If you have not heard from me in 2 weeks, please contact me.  The fastest way to get your results is to register for My Chart (see the instructions on the last page of this printout).  Keep up the GREAT work! 

## 2015-11-11 NOTE — Progress Notes (Signed)
Patient ID: Melissa [name], female    DOB: 08/22/1961, 54 y.o.   MRN: [id number]  PCP: Wynne Dust  Subjective:   Chief Complaint  Patient presents with  . Follow-up  . Hypertension  . Hyperlipidemia  . seen Dr. Cruzita Lederer    HPI Presents for evaluation of HTN and hyperlipidemia. DM managed by Dr. Cruzita Lederer.  Feels good. No problems or concerns. Tolerating medications well, without adverse effects.  A1C 5.1% Continues to work on healthy eating and exercise. Is pleased with her weight loss so far. Follow-up with Dr. Cruzita Lederer again in January 2017.  Review of Systems  Constitutional: Negative for activity change, appetite change, fatigue and unexpected weight change.  HENT: Negative for congestion, dental problem, ear pain, hearing loss, mouth sores, postnasal drip, rhinorrhea, sneezing, sore throat, tinnitus and trouble swallowing.   Eyes: Negative for photophobia, pain, redness and visual disturbance.  Respiratory: Negative for cough, chest tightness and shortness of breath.   Cardiovascular: Negative for chest pain, palpitations and leg swelling.  Gastrointestinal: Negative for nausea, vomiting, abdominal pain, diarrhea, constipation and blood in stool.  Genitourinary: Negative for dysuria, urgency, frequency and hematuria.  Musculoskeletal: Negative for myalgias, arthralgias, gait problem and neck stiffness.  Skin: Negative for rash.  Neurological: Negative for dizziness, speech difficulty, weakness, light-headedness, numbness and headaches.  Hematological: Negative for adenopathy.  Psychiatric/Behavioral: Negative for confusion and sleep disturbance. The patient is not nervous/anxious.        Patient Active Problem List   Diagnosis Date Noted  . Type 2 diabetes mellitus with hyperglycemia ([place]) 07/14/2015  . Acute renal failure ([place]) 06/11/2015  . History of esophageal varices 12/13/2014  . HTN (hypertension) 05/07/2012  . GERD (gastroesophageal reflux  disease) 05/07/2012  . AR (allergic rhinitis) 05/07/2012  . BMI 37.0-37.9, adult 05/07/2012  . Hepatic cirrhosis ([place]) 11/25/2009  . SARCOIDOSIS 11/10/2009  . CARDIAC MURMUR 11/10/2009     Prior to Admission medications   Medication Sig Start Date End Date Taking? Authorizing Provider  atorvastatin (LIPITOR) 10 MG tablet Take 1 tablet (10 mg total) by mouth daily. 08/05/15  Yes Oree Mirelez, PA-C  cetirizine (ZYRTEC) 10 MG tablet Take 1 tablet (10 mg total) by mouth daily. 06/05/15  Yes Dorian Heckle English, PA  esomeprazole (NEXIUM) 40 MG capsule Take 1 capsule (40 mg total) by mouth 1 day or 1 dose. 09/29/14  Yes Jathan Balling, PA-C  glucose blood (FREESTYLE TEST STRIPS) test strip Use 2x a day 09/15/15  Yes Philemon Kingdom, MD  glucose monitoring kit (FREESTYLE) monitoring kit 1 each by Does not apply route 4 (four) times daily - after meals and at bedtime. 1 month Diabetic Testing Supplies for QAC-QHS accuchecks.Any brand OK. DX- E11.65 06/13/15  Yes Thurnell Lose, MD  lisinopril (PRINIVIL,ZESTRIL) 2.5 MG tablet Take 1 tablet (2.5 mg total) by mouth daily. 08/05/15  Yes Sophi Calligan, PA-C  metFORMIN (GLUCOPHAGE) 500 MG tablet TAKE 2 TABLETS BY MOUTH 2 TIMES DAILY WITH A MEAL 11/03/15  Yes Philemon Kingdom, MD  nadolol (CORGARD) 40 MG tablet TAKE 1 TABLET (40 MG TOTAL) BY MOUTH DAILY 09/14/15  Yes Elchanan Bob, PA-C  triamterene-hydrochlorothiazide (MAXZIDE-25) 37.5-25 MG tablet TAKE 1 TABLET BY MOUTH DAILY 09/14/15  Yes Macarius Ruark, PA-C  ibuprofen (ADVIL,MOTRIN) 800 MG tablet Take 800 mg by mouth every 8 (eight) hours as needed. for pain 08/13/15   Historical Provider, MD  ipratropium (ATROVENT) 0.06 % nasal spray PLACE 2 SPRAYS INTO BOTH NOSTRILS 4 (FOUR) TIMES DAILY. 09/27/15  Historical Provider, MD     Allergies  Allergen Reactions  . Codeine Nausea And Vomiting  . Erythromycin Nausea And Vomiting  . Ketek [Telithromycin] Nausea And Vomiting  . Oxycodone-Acetaminophen  Nausea And Vomiting  . Penicillins Nausea And Vomiting  . Propoxyphene N-Acetaminophen Nausea And Vomiting  . Sulfonamide Derivatives Nausea And Vomiting  . Vicodin [Hydrocodone-Acetaminophen] Nausea And Vomiting       Objective:  Physical Exam  Constitutional: She is oriented to person, place, and time. Vital signs are normal. She appears well-developed and well-nourished. She is active and cooperative. No distress.  BP 130/84 mmHg  Pulse 61  Temp(Src) 99 F (37.2 C) (Oral)  Resp 16  Ht 5' 2.5" (1.588 m)  Wt 210 lb 6.4 oz (95.437 kg)  BMI 37.85 kg/m2  SpO2 98%  HENT:  Head: Normocephalic and atraumatic.  Right Ear: Hearing normal.  Left Ear: Hearing normal.  Eyes: Conjunctivae are normal. No scleral icterus.  Neck: Normal range of motion. Neck supple. No thyromegaly present.  Cardiovascular: Normal rate, regular rhythm and normal heart sounds.   Pulses:      Radial pulses are 2+ on the right side, and 2+ on the left side.  Pulmonary/Chest: Effort normal and breath sounds normal.  Lymphadenopathy:       Head (right side): No tonsillar, no preauricular, no posterior auricular and no occipital adenopathy present.       Head (left side): No tonsillar, no preauricular, no posterior auricular and no occipital adenopathy present.    She has no cervical adenopathy.       Right: No supraclavicular adenopathy present.       Left: No supraclavicular adenopathy present.  Neurological: She is alert and oriented to person, place, and time. No sensory deficit.  Skin: Skin is warm, dry and intact. No rash noted. No cyanosis or erythema. Nails show no clubbing.  Psychiatric: She has a normal mood and affect. Her speech is normal and behavior is normal. Thought content normal.           Assessment & Plan:   1. Essential hypertension Controlled. Continue current treatment.  - nadolol (CORGARD) 40 MG tablet; TAKE 1 TABLET (40 MG TOTAL) BY MOUTH DAILY  Dispense: 90 tablet; Refill: 3 -  triamterene-hydrochlorothiazide (MAXZIDE-25) 37.5-25 MG tablet; TAKE 1 TABLET BY MOUTH DAILY  Dispense: 90 tablet; Refill: 3  2. Hyperlipidemia Await lab results. Goal LDL<70  3. Gastroesophageal reflux disease, esophagitis presence not specified Stable. Continue current treatment. As she continues to lose weight, consider reducing this, possible D/C. - esomeprazole (NEXIUM) 40 MG capsule; Take 1 capsule (40 mg total) by mouth 1 day or 1 dose.  Dispense: 90 capsule; Refill: 3  4. Allergic rhinitis, unspecified allergic rhinitis type - ipratropium (ATROVENT) 0.06 % nasal spray; Place 2 sprays into both nostrils at bedtime as needed for rhinitis.  Dispense: 15 mL; Refill: 5    Return in about 4 months (around 03/11/2016) for re-evaluation and fasting labs.   Fara Chute, PA-C Physician Assistant-Certified [facility]

## 2015-11-12 ENCOUNTER — Ambulatory Visit
Admission: RE | Admit: 2015-11-12 | Discharge: 2015-11-12 | Disposition: A | Discharge status: Home / Self Care | Payer: 59 | Source: Ambulatory Visit

## 2015-11-12 DIAGNOSIS — Z1231 Encounter for screening mammogram for malignant neoplasm of breast: Secondary | ICD-10-CM

## 2015-12-19 ENCOUNTER — Ambulatory Visit (INDEPENDENT_AMBULATORY_CARE_PROVIDER_SITE_OTHER): Payer: 59 | Admitting: Internal Medicine

## 2015-12-19 ENCOUNTER — Other Ambulatory Visit (INDEPENDENT_AMBULATORY_CARE_PROVIDER_SITE_OTHER): Payer: 59 | Admitting: *Deleted

## 2015-12-19 ENCOUNTER — Encounter: Payer: Self-pay | Admitting: Internal Medicine

## 2015-12-19 VITALS — BP 114/72 | HR 66 | Temp 98.1°F | Resp 12 | Wt 207.0 lb

## 2015-12-19 DIAGNOSIS — E119 Type 2 diabetes mellitus without complications: Secondary | ICD-10-CM

## 2015-12-19 DIAGNOSIS — E1165 Type 2 diabetes mellitus with hyperglycemia: Secondary | ICD-10-CM | POA: Insufficient documentation

## 2015-12-19 DIAGNOSIS — Z794 Long term (current) use of insulin: Secondary | ICD-10-CM | POA: Insufficient documentation

## 2015-12-19 LAB — POCT GLYCOSYLATED HEMOGLOBIN (HGB A1C): HEMOGLOBIN A1C: 5.1

## 2015-12-19 MED ORDER — GLUCOSE BLOOD VI STRP
ORAL_STRIP | Status: DC
Start: 2015-12-19 — End: 2016-01-12

## 2015-12-19 MED ORDER — METFORMIN HCL 500 MG PO TABS: ORAL_TABLET | ORAL | Status: DC

## 2015-12-19 NOTE — Progress Notes (Signed)
Patient ID: Melissa [name], female   DOB: September 11, 1961, 55 y.o.   MRN: [id number]  HPI: Melissa [name] is a 55 y.o.-year-old female,  Returning for follow-up for DM2, dx in 06/11/2015 (steroid-induced - for purpura).  Last visit 3 months ago.  Patient was diagnosed with type 2 diabetes (after steroids for purpura) in 05/2015, after which she was started on basal-bolus insulin regimen.  At a previous visit , since her sugars greatly improved on this regimen, we stopped NovoLog, decreased her basal insulin , and started metformin.  Last hemoglobin A1c was: Lab Results  Component Value Date   HGBA1C 5.1 09/15/2015   HGBA1C 14.5* 06/12/2015   HGBA1C 5.5 12/28/2013   Pt is on a regimen of: - Metformin 500 mg 2x a day She stopped:  - Novolog 4 + SSI units 3x a day, before meals She tapered off Lantus 45 units in am   Pt checks her sugars 2x a day and they are: - am: 101-120 >> 82-98 >> 70-97 - 2h after b'fast: n/c >> 91 >> 70-90s - before lunch: 111 >> 74 >> 77 - 2h after lunch: 69-119 >> 81-96 >> 84 - before dinner: 86-105 >> 72-95 >> 78-89 - 2h after dinner: n/c >> 83-93 >> 77-92 - bedtime: 62, 91-126 >> 73-94 >>  80-86 - nighttime: n/c >> 75-89 >> 91 No lows. Lowest sugar was 62 >> 72 >> 61; ? hypoglycemia awareness Highest sugar was 126 >> 100 >> 98  Glucometer: Free Style  Pt's meals are: - Breakfast: cereal or eggs + cheese + toast, boiled egg or oatmeal - Lunch: baked potato, pineapple or sweet potato + cinnamon or soup or cheeseburger + fries - Dinner: chicken pot pie; chicken/fish + mac and cheese + corn bread; beef ribs + potato salad + collard greens - Snacks: chocolate pudding, apples, watermelon, cantaloupe, graham crackers, nuts (sea salt light)  Exercise: treadmill, bike 1-2x weekly  - no CKD, last BUN/creatinine:  Lately improved - per labs by nephrology Lab Results  Component Value Date   BUN 25 11/11/2015   CREATININE 1.26* 11/11/2015  She is not on  an ACE inhibitor. - last set of lipids: Lab Results  Component Value Date   CHOL 111* 11/11/2015   HDL 56 11/11/2015   LDLCALC 43 11/11/2015   TRIG 59 11/11/2015   CHOLHDL 2.0 11/11/2015  She is not on a statin. - last eye exam was in 06/23/2015. No DR.  - no numbness and tingling in her feet. Foot exam by PCP on 06/20/2015 was normal  ROS: Constitutional: + weight loss, no fatigue, no subjective hyperthermia/hypothermia, no excessive urination, no nocturia Eyes: no blurry vision, no xerophthalmia ENT: no sore throat, no nodules palpated in throat, no dysphagia/odynophagia, no hoarseness Cardiovascular: no CP/SOB/palpitations/leg swelling Respiratory: no cough/SOB Gastrointestinal: no N/V/D/C Musculoskeletal: no muscle/joint aches Skin: no rash Neurological: no tremors/no numbness/tingling/dizziness  I reviewed pt's medications, allergies, PMH, social hx, family hx, and changes were documented in the history of present illness. Otherwise, unchanged from my initial visit note.  Past Medical History  Diagnosis Date  . Allergy     sinus allergies  . GERD (gastroesophageal reflux disease)   . Heart murmur   . Hypertension   . Esophageal varices ([place])   . Chronic kidney disease (CKD), stage III (moderate)     Archie Endo 06/11/2015  . Diabetes mellitus without complication ([place]) dx'd 5/37/4827    Archie Endo 06/11/2015   Past Surgical History  Procedure Laterality Date  .  Cesarean section  1979  . Carpal tunnel release Right 1987  . Reduction mammaplasty Bilateral 2005  . Abdominal hysterectomy  1997    "partial"  . Tubal ligation  1981   Social History   Social History  . Marital Status: Married    Spouse Name: Melissa [name], Brooke Bonito  . Number of Children: 1  . Years of Education: 14+   Occupational History  . Sales executive for [employer]     Adult Medicare   Social History Main Topics  . Smoking status: Never Smoker   . Smokeless tobacco: Never Used  . Alcohol  Use: No  . Drug Use: No  . Sexual Activity:    Partners: Male    Birth Control/ Protection: Surgical   Social History Narrative   Lives with her husband and two dogs. Her son is a Engineer, agricultural in [place], [place]. She feels like the grandmother to her sister's 2 children, since the sister's sudden and unexpected death in June 15, 2014.   Current Outpatient Prescriptions on File Prior to Visit  Medication Sig Dispense Refill  . atorvastatin (LIPITOR) 10 MG tablet Take 1 tablet (10 mg total) by mouth daily. 90 tablet 3  . cetirizine (ZYRTEC) 10 MG tablet Take 1 tablet (10 mg total) by mouth daily. 30 tablet 5  . esomeprazole (NEXIUM) 40 MG capsule Take 1 capsule (40 mg total) by mouth 1 day or 1 dose. 90 capsule 3  . glucose blood (FREESTYLE TEST STRIPS) test strip Use 2x a day 100 each 5  . glucose monitoring kit (FREESTYLE) monitoring kit 1 each by Does not apply route 4 (four) times daily - after meals and at bedtime. 1 month Diabetic Testing Supplies for QAC-QHS accuchecks.Any brand OK. DX- E11.65 1 each 1  . ibuprofen (ADVIL,MOTRIN) 800 MG tablet Take 800 mg by mouth every 8 (eight) hours as needed. for pain  0  . ipratropium (ATROVENT) 0.06 % nasal spray Place 2 sprays into both nostrils at bedtime as needed for rhinitis. 15 mL 5  . lisinopril (PRINIVIL,ZESTRIL) 2.5 MG tablet Take 1 tablet (2.5 mg total) by mouth daily. 90 tablet 3  . metFORMIN (GLUCOPHAGE) 500 MG tablet TAKE 2 TABLETS BY MOUTH 2 TIMES DAILY WITH A MEAL 120 tablet 2  . nadolol (CORGARD) 40 MG tablet TAKE 1 TABLET (40 MG TOTAL) BY MOUTH DAILY 90 tablet 3  . triamterene-hydrochlorothiazide (MAXZIDE-25) 37.5-25 MG tablet TAKE 1 TABLET BY MOUTH DAILY 90 tablet 3   No current facility-administered medications on file prior to visit.   Allergies  Allergen Reactions  . Codeine Nausea And Vomiting  . Erythromycin Nausea And Vomiting  . Ketek [Telithromycin] Nausea And Vomiting  . Oxycodone-Acetaminophen Nausea And Vomiting  .  Penicillins Nausea And Vomiting  . Propoxyphene N-Acetaminophen Nausea And Vomiting  . Sulfonamide Derivatives Nausea And Vomiting  . Vicodin [Hydrocodone-Acetaminophen] Nausea And Vomiting   Family History  Problem Relation Age of Onset  . Colon cancer Neg Hx   . Esophageal cancer Neg Hx   . Rectal cancer Neg Hx   . Stomach cancer Neg Hx   . Hypertension Mother   . Hypertension Brother   . Kidney disease Brother     ESRD on Dialysis  . Diabetes Brother   . CAD Sister    PE: BP 114/72 mmHg  Pulse 66  Temp(Src) 98.1 F (36.7 C) (Oral)  Resp 12  Wt 207 lb (93.895 kg)  SpO2 99% Body mass index is 37.23 kg/(m^2). Wt Readings  from Last 3 Encounters:  12/19/15 207 lb (93.895 kg)  11/11/15 210 lb 6.4 oz (95.437 kg)  09/15/15 212 lb 12.8 oz (96.525 kg)   Constitutional: overweight, in NAD Eyes: PERRLA, EOMI, no exophthalmos ENT: moist mucous membranes, no thyromegaly, no cervical lymphadenopathy Cardiovascular: RRR, No MRG Respiratory: CTA B Gastrointestinal: abdomen soft, NT, ND, BS+ Musculoskeletal: no deformities, strength intact in all 4 Skin: moist, warm Neurological: no tremor with outstretched hands, DTR normal in all 4  ASSESSMENT: 1. DM2, recent dx, non-insulin dependent, controlled  PLAN:  1. Patient with new dx of diabetes, on a large dose of basal insulin, with greatly improved control, despite stopping insulin! Her sugars are all <100, we will therefore decrease the Metformin to 500 mg 1x a day and stop at next visit, most likely - I suggested to:  Patient Instructions   Patient Instructions  Please decrease Metformin 500 mg 1x a day with meals.  KEEP UP THE GREAT WORK!  Please come back for a follow-up appointment in 6 months.  - continue checking sugars at different times of the day - check 3x a week, rotating checks - advised for yearly eye exams >> she is UTD - had the flu and PNA vaccine this season - check HbA1c >> 5.1% (stable, great!) - Return  to clinic in 6 mo with sugar log

## 2015-12-19 NOTE — Patient Instructions (Addendum)
Patient Instructions  Please decrease Metformin 500 mg 1x a day with meals.  KEEP UP THE GREAT WORK!  Please come back for a follow-up appointment in 4 months.

## 2016-01-12 ENCOUNTER — Telehealth: Payer: Self-pay | Admitting: Internal Medicine

## 2016-01-12 ENCOUNTER — Other Ambulatory Visit: Payer: Self-pay | Admitting: *Deleted

## 2016-01-12 MED ORDER — ONETOUCH DELICA LANCETS FINE MISC: Status: DC

## 2016-01-12 MED ORDER — GLUCOSE BLOOD VI STRP: ORAL_STRIP | Status: DC

## 2016-01-12 NOTE — Telephone Encounter (Signed)
Pt called. Pharmacy did not get the rx for the strips. Resent to pt's pharmacy.

## 2016-01-12 NOTE — Telephone Encounter (Signed)
Pt called in and said because of her new insurance she is being sent a new meter, One Touch Verio Flex meter.  She said she needs to have test strips for this meter called into [facility] [address]

## 2016-01-25 ENCOUNTER — Other Ambulatory Visit: Payer: Self-pay | Admitting: Internal Medicine

## 2016-01-26 NOTE — Telephone Encounter (Signed)
Received refill request electronically Last refill 12/19/15 #60/1 Request shows 2 by mouth twice daily, which does not match the medication list Please confirm that directions were change at last office visit and patient should have refills

## 2016-03-10 ENCOUNTER — Ambulatory Visit (INDEPENDENT_AMBULATORY_CARE_PROVIDER_SITE_OTHER): Payer: 59 | Admitting: Physician Assistant

## 2016-03-10 DIAGNOSIS — M6248 Contracture of muscle, other site: Secondary | ICD-10-CM

## 2016-03-10 DIAGNOSIS — E1165 Type 2 diabetes mellitus with hyperglycemia: Secondary | ICD-10-CM | POA: Diagnosis not present

## 2016-03-10 DIAGNOSIS — M62838 Other muscle spasm: Secondary | ICD-10-CM

## 2016-03-10 MED ORDER — CYCLOBENZAPRINE HCL 10 MG PO TABS: 10.0000 mg | ORAL_TABLET | Freq: Three times a day (TID) | ORAL | Status: DC | PRN

## 2016-03-10 NOTE — Patient Instructions (Addendum)
Continue the acetaminophen (Tylenol). Continue the heating pad/warm compresses. Consider the TheraCare heating pads with adhesive. Get someone to massage the area every night (more often if possible). Be sure to drink lots of water!  The muscle relaxer (cyclobenzaprine) can make you sleepy. You may need to reserve it for bedtime.    IF you received an x-ray today, you will receive an invoice from [facility]. Please contact [facility] at [phone] with questions or concerns regarding your invoice.   IF you received labwork today, you will receive an invoice from [facility]. Please contact [facility] at [phone] with questions or concerns regarding your invoice.   Our billing staff will not be able to assist you with questions regarding bills from these companies.  You will be contacted with the lab results as soon as they are available. The fastest way to get your results is to activate your My Chart account. Instructions are located on the last page of this paperwork. If you have not heard from us regarding the results in 2 weeks, please contact this office.

## 2016-03-10 NOTE — Progress Notes (Signed)
Subjective:    Patient ID: Melissa [name], female    DOB: 04/25/1961, 55 y.o.   MRN: [id number]  Chief Complaint  Patient presents with  . Arm Pain    Left    HPI  Melissa [name] is a 55yo AA female with pmh significant for HTN, GERD, Type II DM that presents for left neck and arm pain.  Patient states her pain started approx 5 days ago after unpacking her spring clothes. She states the pain is located on the left side of her neck that radiates down her left trapezius into her left deltoid. She states the pain is worse with movement. She thought she might have slept on her arm wrong at night.   Flexeril and heat makes it better. Patient is not able to take ibuprofen due to GERD. Has tried tylenol with little relief. The pain is the same all the time not worse at night. She denies any recent illness, fever, chills headache, blurry vision, cp, sob, weakness, increased fatigue, numbness, tingling, or N/V/D.   Denies injuring her arm. She is able to raise her arm above her head.    Current Outpatient Prescriptions on File Prior to Visit  Medication Sig Dispense Refill  . atorvastatin (LIPITOR) 10 MG tablet Take 1 tablet (10 mg total) by mouth daily. 90 tablet 3  . BD PEN NEEDLE NANO U/F 32G X 4 MM MISC USE TO TEST 1 TIME A DAY  1  . esomeprazole (NEXIUM) 40 MG capsule Take 1 capsule (40 mg total) by mouth 1 day or 1 dose. 90 capsule 3  . glucose blood (ONETOUCH VERIO) test strip Use to test blood sugar 3 times a week as instructed. Dx: E11.9 100 each 11  . lisinopril (PRINIVIL,ZESTRIL) 2.5 MG tablet Take 1 tablet (2.5 mg total) by mouth daily. 90 tablet 3  . metFORMIN (GLUCOPHAGE) 500 MG tablet TAKE 1 TABLETS BY MOUTH once DAILY WITH A MEAL 60 tablet 1  . metFORMIN (GLUCOPHAGE) 500 MG tablet Take 1 tablet (500 mg total) by mouth daily with supper. 90 tablet 2  . nadolol (CORGARD) 40 MG tablet TAKE 1 TABLET (40 MG TOTAL) BY MOUTH DAILY 90 tablet 3  . ONETOUCH DELICA LANCETS FINE MISC Use  to test blood sugar 3 times a week as instructed. Dx: E11.9 100 each 1  . triamterene-hydrochlorothiazide (MAXZIDE-25) 37.5-25 MG tablet TAKE 1 TABLET BY MOUTH DAILY 90 tablet 3  . cetirizine (ZYRTEC) 10 MG tablet Take 1 tablet (10 mg total) by mouth daily. (Patient not taking: Reported on 03/10/2016) 30 tablet 5  . ipratropium (ATROVENT) 0.06 % nasal spray Place 2 sprays into both nostrils at bedtime as needed for rhinitis. (Patient not taking: Reported on 03/10/2016) 15 mL 5   No current facility-administered medications on file prior to visit.   Allergies  Allergen Reactions  . Codeine Nausea And Vomiting  . Erythromycin Nausea And Vomiting  . Ketek [Telithromycin] Nausea And Vomiting  . Oxycodone-Acetaminophen Nausea And Vomiting  . Penicillins Nausea And Vomiting  . Propoxyphene N-Acetaminophen Nausea And Vomiting  . Sulfonamide Derivatives Nausea And Vomiting  . Vicodin [Hydrocodone-Acetaminophen] Nausea And Vomiting      Review of Systems  Constitutional: Negative for fever, chills and fatigue.  HENT: Negative.   Eyes: Negative.   Respiratory: Negative for cough and shortness of breath.   Cardiovascular: Negative for chest pain.  Gastrointestinal: Negative for nausea, vomiting, abdominal pain and diarrhea.  Musculoskeletal: Positive for myalgias and neck pain. Negative for  back pain, arthralgias and neck stiffness.  Neurological: Negative for dizziness, weakness, light-headedness, numbness and headaches.       Objective:   Physical Exam  Constitutional: She is oriented to person, place, and time. She appears well-developed and well-nourished.  Eyes: Conjunctivae and EOM are normal. Pupils are equal, round, and reactive to light.  Neck: Normal range of motion and full passive range of motion without pain. Neck supple. Muscular tenderness (Over SCM muscle and left trapezius ) present. No spinous process tenderness present. No edema, no erythema and normal range of motion  present. No thyromegaly present.  Cardiovascular: Normal rate, regular rhythm, normal heart sounds and intact distal pulses.   Pulmonary/Chest: Effort normal and breath sounds normal.  Musculoskeletal: Normal range of motion. She exhibits tenderness (Palpation of left trapezius muscle and left SCM muscle, very tense).  Lymphadenopathy:    She has no cervical adenopathy.  Neurological: She is alert and oriented to person, place, and time.  Skin: Skin is warm and dry.  Psychiatric: She has a normal mood and affect. Her behavior is normal. Judgment and thought content normal.           Assessment & Plan:  1. Trapezius muscle spasm Most likely MS sprain from lifting boxes of clothes. Pain is reproducible with palpation. Muscular very tense upon palpation. Patient states heat and flexeril relieve the pain. Patient unable to take NSAIDs due to GERD. May take tylenol as needed for pain. Continue warm compresses.  - cyclobenzaprine (FLEXERIL) 10 MG tablet; Take 1 tablet (10 mg total) by mouth 3 (three) times daily as needed for muscle spasms.  Dispense: 30 tablet; Refill: 0  2. Type 2 diabetes mellitus with hyperglycemia, without long-term current use of insulin ([facility]CC) - Microalbumin, urine  Azucena Kuba PA-S 03/10/2016

## 2016-03-11 LAB — MICROALBUMIN, URINE

## 2016-03-13 NOTE — Progress Notes (Signed)
Patient ID: Melissa [name], female    DOB: 01/19/1961, 55 y.o.   MRN: [id number]  PCP: Olene FlossJEFFERY,Avanthika Dehnert, PA-C  Subjective:   Chief Complaint  Patient presents with  . Arm Pain    Left    HPI Presents for evaluation of left neck and arm pain.  Patient states her pain started approx 5 days ago after unpacking her spring clothes. She states the pain is located on the left side of her neck that radiates down her left trapezius into her left deltoid. She states the pain is worse with movement. She thought she might have slept on her arm wrong at night.   Flexeril and heat make it better. Patient is not able to take ibuprofen due to GERD. Has tried tylenol with little relief. The pain is the same all the time not worse at night. She denies any recent illness, fever, chills headache, blurry vision, cp, sob, weakness, increased fatigue, numbness, tingling, or N/V/D.   Denies injuring her arm. She is able to raise her arm above her head.  Diabetes is managed by endocrinology, and has been controlled. However, I note that she is over due for urine microalbumin.   Review of Systems Constitutional: Negative for fever, chills and fatigue.  HENT: Negative.  Eyes: Negative.  Respiratory: Negative for cough and shortness of breath.  Cardiovascular: Negative for chest pain.  Gastrointestinal: Negative for nausea, vomiting, abdominal pain and diarrhea.  Musculoskeletal: Positive for myalgias and neck pain. Negative for back pain, arthralgias and neck stiffness.  Neurological: Negative for dizziness, weakness, light-headedness, numbness and headaches.       Patient Active Problem List   Diagnosis Date Noted  . Diabetes type 2, controlled (HCC) 12/19/2015  . Hyperlipidemia 11/11/2015  . Acute renal failure (HCC) 06/11/2015  . History of esophageal varices 12/13/2014  . HTN (hypertension) 05/07/2012  . GERD (gastroesophageal reflux disease) 05/07/2012  . AR (allergic rhinitis)  05/07/2012  . BMI 37.0-37.9, adult 05/07/2012  . Hepatic cirrhosis (HCC) 11/25/2009  . SARCOIDOSIS 11/10/2009  . CARDIAC MURMUR 11/10/2009     Prior to Admission medications   Medication Sig Start Date End Date Taking? Authorizing Provider  atorvastatin (LIPITOR) 10 MG tablet Take 1 tablet (10 mg total) by mouth daily. 08/05/15  Yes Jance Siek, PA-C  BD PEN NEEDLE NANO U/F 32G X 4 MM MISC USE TO TEST 1 TIME A DAY 11/17/15  Yes Historical Provider, MD  esomeprazole (NEXIUM) 40 MG capsule Take 1 capsule (40 mg total) by mouth 1 day or 1 dose. 11/11/15  Yes Marigrace Mccole, PA-C  glucose blood (ONETOUCH VERIO) test strip Use to test blood sugar 3 times a week as instructed. Dx: E11.9 01/12/16  Yes Carlus Pavlovristina Gherghe, MD  lisinopril (PRINIVIL,ZESTRIL) 2.5 MG tablet Take 1 tablet (2.5 mg total) by mouth daily. 08/05/15  Yes Rindi Beechy, PA-C  metFORMIN (GLUCOPHAGE) 500 MG tablet TAKE 1 TABLETS BY MOUTH once DAILY WITH A MEAL 12/19/15  Yes Carlus Pavlovristina Gherghe, MD  metFORMIN (GLUCOPHAGE) 500 MG tablet Take 1 tablet (500 mg total) by mouth daily with supper. 01/26/16  Yes Carlus Pavlovristina Gherghe, MD  nadolol (CORGARD) 40 MG tablet TAKE 1 TABLET (40 MG TOTAL) BY MOUTH DAILY 11/11/15  Yes Lynnsie Linders, PA-C  ONETOUCH DELICA LANCETS FINE MISC Use to test blood sugar 3 times a week as instructed. Dx: E11.9 01/12/16  Yes Carlus Pavlovristina Gherghe, MD  triamterene-hydrochlorothiazide (MAXZIDE-25) 37.5-25 MG tablet TAKE 1 TABLET BY MOUTH DAILY 11/11/15  Yes Cuma Polyakov, PA-C  cetirizine (  ZYRTEC) 10 MG tablet Take 1 tablet (10 mg total) by mouth daily. Patient not taking: Reported on 03/10/2016 06/05/15   Collie Siad English, PA  ipratropium (ATROVENT) 0.06 % nasal spray Place 2 sprays into both nostrils at bedtime as needed for rhinitis. Patient not taking: Reported on 03/10/2016 11/11/15   Porfirio Oar, PA-C     Allergies  Allergen Reactions  . Codeine Nausea And Vomiting  . Erythromycin Nausea And Vomiting  . Ketek  [Telithromycin] Nausea And Vomiting  . Oxycodone-Acetaminophen Nausea And Vomiting  . Penicillins Nausea And Vomiting  . Propoxyphene N-Acetaminophen Nausea And Vomiting  . Sulfonamide Derivatives Nausea And Vomiting  . Vicodin [Hydrocodone-Acetaminophen] Nausea And Vomiting       Objective:  Physical Exam  Constitutional: She is oriented to person, place, and time. She appears well-developed and well-nourished. She is active and cooperative. No distress.  There were no vitals taken for this visit.  HENT:  Head: Normocephalic and atraumatic.  Right Ear: Hearing normal.  Left Ear: Hearing normal.  Eyes: Conjunctivae are normal. No scleral icterus.  Neck: Normal range of motion, full passive range of motion without pain and phonation normal. Neck supple. Muscular tenderness present. No spinous process tenderness present. No thyromegaly present.    Cardiovascular: Normal rate, regular rhythm and normal heart sounds.   Pulses:      Radial pulses are 2+ on the right side, and 2+ on the left side.  Pulmonary/Chest: Effort normal and breath sounds normal.  Musculoskeletal:       Left shoulder: Normal.       Left elbow: Normal.       Left wrist: Normal.       Cervical back: She exhibits tenderness, pain and spasm. She exhibits normal range of motion and no bony tenderness.       Thoracic back: Normal.       Lumbar back: Normal.       Back:       Left upper arm: Normal.       Left forearm: Normal.       Left hand: Normal. Normal sensation noted. Normal strength noted.  Lymphadenopathy:       Head (right side): No tonsillar, no preauricular, no posterior auricular and no occipital adenopathy present.       Head (left side): No tonsillar, no preauricular, no posterior auricular and no occipital adenopathy present.    She has no cervical adenopathy.       Right: No supraclavicular adenopathy present.       Left: No supraclavicular adenopathy present.  Neurological: She is alert and  oriented to person, place, and time. No sensory deficit.  Skin: Skin is warm, dry and intact. No rash noted. No cyanosis or erythema. Nails show no clubbing.  Psychiatric: She has a normal mood and affect. Her speech is normal and behavior is normal.           Assessment & Plan:   1. Trapezius muscle spasm Acetaminophen. Cyclobenzaprine. Heat. Gentle ROM. Consider PT if symptoms persist. - cyclobenzaprine (FLEXERIL) 10 MG tablet; Take 1 tablet (10 mg total) by mouth 3 (three) times daily as needed for muscle spasms.  Dispense: 30 tablet; Refill: 0  2. Type 2 diabetes mellitus with hyperglycemia, without long-term current use of insulin (HCC) Continue follow-up with endocrinology. Update urine microalbumin. - Microalbumin, urine   Fernande Bras, PA-C Physician Assistant-Certified [facility]

## 2016-03-16 ENCOUNTER — Ambulatory Visit: Payer: 59 | Admitting: Physician Assistant

## 2016-04-22 ENCOUNTER — Other Ambulatory Visit: Payer: Self-pay | Admitting: Physician Assistant

## 2016-04-27 ENCOUNTER — Other Ambulatory Visit: Payer: Self-pay | Admitting: Physician Assistant

## 2016-05-01 ENCOUNTER — Other Ambulatory Visit: Payer: Self-pay | Admitting: Physician Assistant

## 2016-06-18 ENCOUNTER — Ambulatory Visit (INDEPENDENT_AMBULATORY_CARE_PROVIDER_SITE_OTHER): Payer: 59 | Admitting: Internal Medicine

## 2016-06-18 ENCOUNTER — Encounter: Payer: Self-pay | Admitting: Internal Medicine

## 2016-06-18 VITALS — BP 118/82 | HR 71 | Ht 62.0 in | Wt 211.0 lb

## 2016-06-18 DIAGNOSIS — E119 Type 2 diabetes mellitus without complications: Secondary | ICD-10-CM | POA: Diagnosis not present

## 2016-06-18 LAB — POCT GLYCOSYLATED HEMOGLOBIN (HGB A1C): HEMOGLOBIN A1C: 5.2

## 2016-06-18 MED ORDER — GLUCOSE BLOOD VI STRP: ORAL_STRIP | Status: DC

## 2016-06-18 MED ORDER — METFORMIN HCL 500 MG PO TABS: 500.0000 mg | ORAL_TABLET | Freq: Every day | ORAL | Status: DC

## 2016-06-18 NOTE — Progress Notes (Signed)
Patient ID: Melissa [name], female   DOB: 07/27/1961, 55 y.o.   MRN: [id number]  HPI: Melissa [name] is a 55 y.o.-year-old female,  Returning for follow-up for DM2, dx in 06/11/2015 (steroid-induced - for purpura).  Last visit 3 months ago.  Patient was diagnosed with type 2 diabetes (after steroids for purpura) in 05/2015, after which she was started on basal-bolus insulin regimen.  At a previous visit , since her sugars greatly improved on this regimen, we stopped NovoLog, decreased her basal insulin , and started metformin.  Last hemoglobin A1c was: Lab Results  Component Value Date   HGBA1C 5.1 12/19/2015   HGBA1C 5.1 09/15/2015   HGBA1C 14.5* 06/12/2015   Pt is on a regimen of: - Metformin 500 mg 1x a day She stopped:  - Novolog 4 + SSI units 3x a day, before meals She tapered off Lantus 45 units in am   Pt checks her sugars 2x a day and they are 69-95, 98.  Glucometer: Free Style  Pt's meals are: - Breakfast: cereal or eggs + cheese + toast, boiled egg or oatmeal - Lunch: baked potato, pineapple or sweet potato + cinnamon or soup or cheeseburger + fries - Dinner: chicken pot pie; chicken/fish + mac and cheese + corn bread; beef ribs + potato salad + collard greens - Snacks: chocolate pudding, apples, watermelon, cantaloupe, graham crackers, nuts (sea salt light)  Exercise: treadmill, bike 1-2x weekly  - no CKD, last BUN/creatinine:  Lately improved - per labs by nephrology Lab Results  Component Value Date   BUN 25 11/11/2015   CREATININE 1.26* 11/11/2015  She is not on an ACE inhibitor. - last set of lipids: Lab Results  Component Value Date   CHOL 111* 11/11/2015   HDL 56 11/11/2015   LDLCALC 43 11/11/2015   TRIG 59 11/11/2015   CHOLHDL 2.0 11/11/2015  She is not on a statin. - last eye exam was in 06/23/2015. No DR.  - no numbness and tingling in her feet. Foot exam by PCP on 06/20/2015 was normal.  ROS: Constitutional:no weight loss or gain, no  fatigue, no subjective hyperthermia/hypothermia, no excessive urination, no nocturia Eyes: no blurry vision, no xerophthalmia ENT: no sore throat, no nodules palpated in throat, no dysphagia/odynophagia, no hoarseness Cardiovascular: no CP/SOB/palpitations/leg swelling Respiratory: no cough/SOB Gastrointestinal: no N/V/D/C Musculoskeletal: no muscle/joint aches Skin: no rash Neurological: no tremors/no numbness/tingling/dizziness  I reviewed pt's medications, allergies, PMH, social hx, family hx, and changes were documented in the history of present illness. Otherwise, unchanged from my initial visit note.  Past Medical History  Diagnosis Date  . Allergy     sinus allergies  . GERD (gastroesophageal reflux disease)   . Heart murmur   . Hypertension   . Esophageal varices (HCC)   . Chronic kidney disease (CKD), stage III (moderate)     [name] 06/11/2015  . Diabetes mellitus without complication (HCC) dx'd 06/11/2015    [name] 06/11/2015   Past Surgical History  Procedure Laterality Date  . Cesarean section  1979  . Carpal tunnel release Right 1987  . Reduction mammaplasty Bilateral 2005  . Abdominal hysterectomy  1997    "partial"  . Tubal ligation  1981   Social History   Social History  . Marital Status: Married    Spouse Name: Tykerria Mccubbins, Montez Hageman  . Number of Children: 1  . Years of Education: 14+   Occupational History  . 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 Smoking status: Never Smoker   . Smokeless tobacco: Never Used  . Alcohol Use: No  . Drug Use: No  . Sexual Activity:    Partners: Male    Birth Control/ Protection: Surgical   Social History Narrative   Lives with her husband and two dogs. Her son is a Veterinary surgeon in [place], [place]. She feels like the grandmother to her sister's 2 children, since the sister's sudden and unexpected death in 06-28-14.   Current Outpatient Prescriptions on File Prior to  Visit  Medication Sig Dispense Refill  . atorvastatin (LIPITOR) 10 MG tablet Take 1 tablet (10 mg total) by mouth daily. 90 tablet 3  . BD PEN NEEDLE NANO U/F 32G X 4 MM MISC USE TO TEST 1 TIME A DAY  1  . cyclobenzaprine (FLEXERIL) 10 MG tablet Take 1 tablet (10 mg total) by mouth 3 (three) times daily as needed for muscle spasms. 30 tablet 0  . esomeprazole (NEXIUM) 40 MG capsule Take 1 capsule (40 mg total) by mouth 1 day or 1 dose. 90 capsule 3  . glucose blood (ONETOUCH VERIO) test strip Use to test blood sugar 3 times a week as instructed. Dx: E11.9 100 each 11  . lisinopril (PRINIVIL,ZESTRIL) 2.5 MG tablet Take 1 tablet (2.5 mg total) by mouth daily. 90 tablet 3  . metFORMIN (GLUCOPHAGE) 500 MG tablet Take 1 tablet (500 mg total) by mouth daily with supper. 90 tablet 2  . nadolol (CORGARD) 40 MG tablet TAKE 1 TABLET (40 MG TOTAL) BY MOUTH DAILY 90 tablet 3  . ONETOUCH DELICA LANCETS FINE MISC Use to test blood sugar 3 times a week as instructed. Dx: E11.9 100 each 1  . triamterene-hydrochlorothiazide (MAXZIDE-25) 37.5-25 MG tablet TAKE 1 TABLET BY MOUTH DAILY 90 tablet 3  . cetirizine (ZYRTEC) 10 MG tablet Take 1 tablet (10 mg total) by mouth daily. (Patient not taking: Reported on 03/10/2016) 30 tablet 5  . ipratropium (ATROVENT) 0.06 % nasal spray Place 2 sprays into both nostrils at bedtime as needed for rhinitis. (Patient not taking: Reported on 03/10/2016) 15 mL 5   No current facility-administered medications on file prior to visit.   Allergies  Allergen Reactions  . Codeine Nausea And Vomiting  . Erythromycin Nausea And Vomiting  . Ketek [Telithromycin] Nausea And Vomiting  . Oxycodone-Acetaminophen Nausea And Vomiting  . Penicillins Nausea And Vomiting  . Propoxyphene N-Acetaminophen Nausea And Vomiting  . Sulfonamide Derivatives Nausea And Vomiting  . Vicodin [Hydrocodone-Acetaminophen] Nausea And Vomiting   Family History  Problem Relation Age of Onset  . Colon cancer Neg  Hx   . Esophageal cancer Neg Hx   . Rectal cancer Neg Hx   . Stomach cancer Neg Hx   . Hypertension Mother   . Hypertension Brother   . Kidney disease Brother     ESRD on Dialysis  . Diabetes Brother   . CAD Sister    PE: BP 118/82 mmHg  Pulse 71  Ht 5\' 2"  (1.575 m)  Wt 211 lb (95.709 kg)  BMI 38.58 kg/m2  SpO2 97% Body mass index is 38.58 kg/(m^2). Wt Readings from Last 3 Encounters:  06/18/16 211 lb (95.709 kg)  12/19/15 207 lb (93.895 kg)  11/11/15 210 lb 6.4 oz (95.437 kg)   Constitutional: overweight, in NAD Eyes: PERRLA, EOMI, no exophthalmos ENT: moist mucous membranes, no thyromegaly, no cervical lymphadenopathy Cardiovascular: RRR, No MRG Respiratory: CTA B Gastrointestinal: abdomen  soft, NT, ND, BS+ Musculoskeletal: no deformities, strength intact in all 4 Skin: moist, warm Neurological: no tremor with outstretched hands, DTR normal in all 4  ASSESSMENT: 1. DM2, recent dx, non-insulin dependent, controlled  PLAN:  1. Patient with steroid-induced diabetes, now in remission, with greatly improved control, despite stopping insulin and decreasing Metformin! Her sugars are all <100, we will continue Metformin 500 mg daily - she would like to keep this also for appetite control. - I suggested to:  Patient Instructions  Please continue Metformin 500 mg daily  Check sugars 3x a week and let me know if they start to increase.  Please come back for a follow-up appointment in 6 months.  - continue checking sugars at different times of the day - check 3x a week, rotating checks - advised for yearly eye exams >> she is UTD - check HbA1c >> 5.2% (stable, great!) - Return to clinic in 6 mo with sugar log - we can space visits 1 year apart after next visit  Carlus Pavlovristina Jakiya Bookbinder, MD PhD [facility]

## 2016-06-18 NOTE — Patient Instructions (Addendum)
Please continue Metformin 500 mg daily  Check sugars 3x a week and let me know if they start to increase.  Please come back for a follow-up appointment in 6 months.

## 2016-06-18 NOTE — Addendum Note (Signed)
Addended by: Darene LamerHOMPSON, Drucella Karbowski T on: 06/18/2016 04:11 PM   Modules accepted: Orders

## 2016-07-05 ENCOUNTER — Other Ambulatory Visit: Payer: Self-pay | Admitting: Physician Assistant

## 2016-07-05 DIAGNOSIS — E1165 Type 2 diabetes mellitus with hyperglycemia: Secondary | ICD-10-CM

## 2016-08-06 ENCOUNTER — Encounter: Payer: Self-pay | Admitting: Physician Assistant

## 2016-08-08 ENCOUNTER — Other Ambulatory Visit: Payer: Self-pay | Admitting: Physician Assistant

## 2016-08-08 DIAGNOSIS — M62838 Other muscle spasm: Secondary | ICD-10-CM

## 2016-10-23 ENCOUNTER — Ambulatory Visit (INDEPENDENT_AMBULATORY_CARE_PROVIDER_SITE_OTHER): Payer: 59 | Admitting: Family Medicine

## 2016-10-23 VITALS — BP 132/88 | HR 76 | Temp 98.3°F | Resp 17 | Ht 62.0 in | Wt 220.0 lb

## 2016-10-23 DIAGNOSIS — B029 Zoster without complications: Secondary | ICD-10-CM

## 2016-10-23 DIAGNOSIS — R21 Rash and other nonspecific skin eruption: Secondary | ICD-10-CM

## 2016-10-23 MED ORDER — TRAMADOL HCL 50 MG PO TABS: 50.0000 mg | ORAL_TABLET | Freq: Four times a day (QID) | ORAL | 0 refills | Status: DC | PRN

## 2016-10-23 MED ORDER — VALACYCLOVIR HCL 1 G PO TABS: 1000.0000 mg | ORAL_TABLET | Freq: Three times a day (TID) | ORAL | 0 refills | Status: DC

## 2016-10-23 NOTE — Progress Notes (Addendum)
Subjective:  By signing my name below, I, Melissa [name], attest that this documentation has been prepared under the direction and in the presence of Melissa StaggersJeffrey Lucius Wise, MD. Electronically Signed: Stann Oresung-Kai [name], Scribe. 10/23/2016 , 12:15 PM .  Patient was seen in Room 9 .   Patient ID: Melissa [name], female    DOB: 09/12/1961, 55 y.o.   MRN: [id number] Chief Complaint  Patient presents with  . Rash    Right side of back.   HPI Melissa [name] is a 55 y.o. female  Patient complains of itchy and burning rash over the right side of her back starting 2 days ago. She notes having some pain and discomfort when something rubs against the affected area. She reports only having rash over that area; denies having rash over any other area. She has not received shingles vaccine. She's been taking OTC tylenol.   She's taken hydrocodone in the past, but caused her to have nausea and vomiting.   Patient Active Problem List   Diagnosis Date Noted  . Controlled type 2 diabetes mellitus without complication, without long-term current use of insulin (HCC) 06/18/2016  . Diabetes type 2, controlled (HCC) 12/19/2015  . Hyperlipidemia 11/11/2015  . Acute renal failure (HCC) 06/11/2015  . History of esophageal varices 12/13/2014  . HTN (hypertension) 05/07/2012  . GERD (gastroesophageal reflux disease) 05/07/2012  . AR (allergic rhinitis) 05/07/2012  . BMI 37.0-37.9, adult 05/07/2012  . Hepatic cirrhosis (HCC) 11/25/2009  . SARCOIDOSIS 11/10/2009  . CARDIAC MURMUR 11/10/2009   Past Medical History:  Diagnosis Date  . Allergy    sinus allergies  . Chronic kidney disease (CKD), stage III (moderate)    [name]/notes 06/11/2015  . Diabetes mellitus without complication (HCC) dx'd 06/11/2015   [name]/notes 06/11/2015  . Esophageal varices (HCC)   . GERD (gastroesophageal reflux disease)   . Heart murmur   . Hypertension    Past Surgical History:  Procedure Laterality Date  . ABDOMINAL HYSTERECTOMY   1997   "partial"  . CARPAL TUNNEL RELEASE Right 1987  . CESAREAN SECTION  1979  . REDUCTION MAMMAPLASTY Bilateral 2005  . TUBAL LIGATION  1981   Allergies  Allergen Reactions  . Codeine Nausea And Vomiting  . Erythromycin Nausea And Vomiting  . Ketek [Telithromycin] Nausea And Vomiting  . Oxycodone-Acetaminophen Nausea And Vomiting  . Penicillins Nausea And Vomiting  . Propoxyphene N-Acetaminophen Nausea And Vomiting  . Sulfonamide Derivatives Nausea And Vomiting  . Vicodin [Hydrocodone-Acetaminophen] Nausea And Vomiting   Prior to Admission medications   Medication Sig Start Date End Date Taking? Authorizing Provider  esomeprazole (NEXIUM) 40 MG capsule Take 1 capsule (40 mg total) by mouth 1 day or 1 dose. 11/11/15  Yes Chelle Jeffery, PA-C  nadolol (CORGARD) 40 MG tablet TAKE 1 TABLET (40 MG TOTAL) BY MOUTH DAILY 11/11/15  Yes Chelle Jeffery, PA-C  triamterene-hydrochlorothiazide (MAXZIDE-25) 37.5-25 MG tablet TAKE 1 TABLET BY MOUTH DAILY 11/11/15  Yes Chelle Jeffery, PA-C  cetirizine (ZYRTEC) 10 MG tablet Take 1 tablet (10 mg total) by mouth daily. Patient not taking: Reported on 10/23/2016 06/05/15   Collie SiadStephanie D English, PA  cyclobenzaprine (FLEXERIL) 10 MG tablet Take 1 tablet (10 mg total) by mouth 3 (three) times daily as needed for muscle spasms. Patient not taking: Reported on 10/23/2016 03/10/16   Chelle Jeffery, PA-C  glucose blood (ONETOUCH VERIO) test strip Use to test blood sugar 3 times a week as instructed. Dx: E11.9 Patient not taking: Reported on 10/23/2016 06/18/16  Carlus Pavlovristina Gherghe, MD  ipratropium (ATROVENT) 0.06 % nasal spray Place 2 sprays into both nostrils at bedtime as needed for rhinitis. Patient not taking: Reported on 10/23/2016 11/11/15   Chelle Jeffery, PA-C  lisinopril (PRINIVIL,ZESTRIL) 2.5 MG tablet TAKE 1 TABLET (2.5 MG TOTAL) BY MOUTH DAILY. Patient not taking: Reported on 10/23/2016 07/06/16   Porfirio Oarhelle Jeffery, PA-C  metFORMIN (GLUCOPHAGE) 500 MG tablet  Take 1 tablet (500 mg total) by mouth daily with supper. Patient not taking: Reported on 10/23/2016 06/18/16   Carlus Pavlovristina Gherghe, MD  [facility] DEL[facility] LANCETS FINE MISC Use to test blood sugar 3 times a week as instructed. Dx: E11.9 Patient not taking: Reported on 10/23/2016 01/12/16   Carlus Pavlovristina Gherghe, MD   Social History   Social History  . Marital status: Married    Spouse name: [name], Jr  . Number of children: 1  . Years of education: 14+   Occupational History  . Office Specialist     Adult Medicare   Social History Main Topics  . Smoking status: Never Smoker  . Smokeless tobacco: Never Used  . Alcohol use No  . Drug use: No  . Sexual activity: Yes    Partners: Male    Birth control/ protection: Surgical   Other Topics Concern  . Not on file   Social History Narrative   Lives with her husband and two dogs. Her son is a Veterinary surgeonsheriff in [place]. She feels like the grandmother to her sister's 2 children, since the sister's sudden and unexpected death in July 2015.   Review of Systems  Constitutional: Negative for chills, fatigue, fever and unexpected weight change.  Respiratory: Negative for cough.   Gastrointestinal: Negative for constipation, diarrhea, nausea and vomiting.  Skin: Positive for rash. Negative for wound.  Neurological: Negative for dizziness, weakness and headaches.       Objective:   Physical Exam  Constitutional: She is oriented to person, place, and time. She appears well-developed and well-nourished. No distress.  HENT:  Head: Normocephalic and atraumatic.  Eyes: EOM are normal. Pupils are equal, round, and reactive to light.  Neck: Neck supple.  Cardiovascular: Normal rate.   Pulmonary/Chest: Effort normal. No respiratory distress.  Musculoskeletal: Normal range of motion.  Neurological: She is alert and oriented to person, place, and time.  Skin: Skin is warm and dry.  Located right mid-back to the right side of her back, there are  clusters of clear vesicles overlying erythematous patches; rash doesn't extend past the right back but does have burning and discomfort across the skin to the right side of the chest wall  Psychiatric: She has a normal mood and affect. Her behavior is normal.  Nursing note and vitals reviewed.   Vitals:   10/23/16 1116  BP: 132/88  Pulse: 76  Resp: 17  Temp: 98.3 F (36.8 C)  TempSrc: Oral  SpO2: 96%  Weight: 220 lb (99.8 kg)  Height: 5\' 2"  (1.575 m)      Assessment & Plan:    Inaaya Paula ComptonHinton Brooke is a 55 y.o. female Rash of back  Herpes zoster without complication  Mid back right rash consistent with shingles.  -Valtrex 1 g 3 times a day for 1 week, Tylenol, Advil or Aleve over-the-counter if needed for pain (Start with Tylenol as history of elevated creatinine, and if NSAID needed - short-term, low-dose use only), or tramadol prescribed if needed. Intolerance to hydrocodone with nausea and vomiting noted. Advised if any recurrence of nausea and vomiting with  tramadol, to stop it right away. RTC precautions, and discussed shingles vaccine. Would wait for at least 6 months if she decides to have prior to age 40.  Meds ordered this encounter  Medications  . valACYclovir (VALTREX) 1000 MG tablet    Sig: Take 1 tablet (1,000 mg total) by mouth 3 (three) times daily.    Dispense:  21 tablet    Refill:  0  . traMADol (ULTRAM) 50 MG tablet    Sig: Take 1 tablet (50 mg total) by mouth every 6 (six) hours as needed.    Dispense:  20 tablet    Refill:  0   Patient Instructions   Unfortunately your rash does appear to be due to shingles. Start Valtrex today 1 pill 3 times per day. For pain, you can try Tylenol over-the-counter, Advil, or Aleve. If those medicines do not provide enough pain relief, you can switch to tramadol up to every 6 hours. If you have side effects such as nausea or vomiting, stop that medicine right away.  Return to the clinic or go to the nearest emergency  room if any of your symptoms worsen or new symptoms occur.  I would still recommend the shingles vaccine at age 15.   Shingles Shingles, which is also known as herpes zoster, is an infection that causes a painful skin rash and fluid-filled blisters. Shingles is not related to genital herpes, which is a sexually transmitted infection. Shingles only develops in people who:  Have had chickenpox.  Have received the chickenpox vaccine. (This is rare.) What are the causes? Shingles is caused by varicella-zoster virus (VZV). This is the same virus that causes chickenpox. After exposure to VZV, the virus stays in the body in an inactive (dormant) state. Shingles develops if the virus reactivates. This can happen many years after the initial exposure to VZV. It is not known what causes this virus to reactivate. What increases the risk? People who have had chickenpox or received the chickenpox vaccine are at risk for shingles. Infection is more common in people who:  Are older than age 2.  Have a weakened defense (immune) system, such as those with HIV, AIDS, or cancer.  Are taking medicines that weaken the immune system, such as transplant medicines.  Are under great stress. What are the signs or symptoms? Early symptoms of this condition include itching, tingling, and pain in an area on your skin. Pain may be described as burning, stabbing, or throbbing. A few days or weeks after symptoms start, a painful red rash appears, usually on one side of the body in a bandlike or beltlike pattern. The rash eventually turns into fluid-filled blisters that break open, scab over, and dry up in about 2-3 weeks. At any time during the infection, you may also develop:  A fever.  Chills.  A headache.  An upset stomach. How is this diagnosed? This condition is diagnosed with a skin exam. Sometimes, skin or fluid samples are taken from the blisters before a diagnosis is made. These samples are examined  under a microscope or sent to a lab for testing. How is this treated? There is no specific cure for this condition. Your health care provider will probably prescribe medicines to help you manage pain, recover more quickly, and avoid long-term problems. Medicines may include:  Antiviral drugs.  Anti-inflammatory drugs.  Pain medicines. If the area involved is on your face, you may be referred to a specialist, such as an eye doctor (ophthalmologist) or an  ear, nose, and throat (ENT) doctor to help you avoid eye problems, chronic pain, or disability. Follow these instructions at home: Medicines  Take medicines only as directed by your health care provider.  Apply an anti-itch or numbing cream to the affected area as directed by your health care provider. Blister and Rash Care  Take a cool bath or apply cool compresses to the area of the rash or blisters as directed by your health care provider. This may help with pain and itching.  Keep your rash covered with a loose bandage (dressing). Wear loose-fitting clothing to help ease the pain of material rubbing against the rash.  Keep your rash and blisters clean with mild soap and cool water or as directed by your health care provider.  Check your rash every day for signs of infection. These include redness, swelling, and pain that lasts or increases.  Do not pick your blisters.  Do not scratch your rash. General instructions  Rest as directed by your health care provider.  Keep all follow-up visits as directed by your health care provider. This is important.  Until your blisters scab over, your infection can cause chickenpox in people who have never had it or been vaccinated against it. To prevent this from happening, avoid contact with other people, especially:  Babies.  Pregnant women.  Children who have eczema.  Elderly people who have transplants.  People who have chronic illnesses, such as leukemia or AIDS. Contact a  health care provider if:  Your pain is not relieved with prescribed medicines.  Your pain does not get better after the rash heals.  Your rash looks infected. Signs of infection include redness, swelling, and pain that lasts or increases. Get help right away if:  The rash is on your face or nose.  You have facial pain, pain around your eye area, or loss of feeling on one side of your face.  You have ear pain or you have ringing in your ear.  You have loss of taste.  Your condition gets worse. This information is not intended to replace advice given to you by your health care provider. Make sure you discuss any questions you have with your health care provider. Document Released: 11/15/2005 Document Revised: 07/11/2016 Document Reviewed: 09/26/2014 Elsevier Interactive Patient Education  2017 [employer].      IF you received an x-ray today, you will receive an invoice from [facility]. Please contact [facility] at [phone] with questions or concerns regarding your invoice.   IF you received labwork today, you will receive an invoice from [employer] [facility]. Please contact [facility] at [phone] with questions or concerns regarding your invoice.   Our billing staff will not be able to assist you with questions regarding bills from these companies.  You will be contacted with the lab results as soon as they are available. The fastest way to get your results is to activate your My Chart account. Instructions are located on the last page of this paperwork. If you have not heard from [place] regarding the results in 2 weeks, please contact this office.        I personally performed the services described in this documentation, which was scribed in my presence. The recorded information has been reviewed and considered, and addended by me as needed.   Signed,   Melissa Staggers, MD [facility].    10/23/16 12:19 PM

## 2016-10-23 NOTE — Patient Instructions (Addendum)
Unfortunately your rash does appear to be due to shingles. Start Valtrex today 1 pill 3 times per day. For pain, you can try Tylenol over-the-counter, Advil, or Aleve. If those medicines do not provide enough pain relief, you can switch to tramadol up to every 6 hours. If you have side effects such as nausea or vomiting, stop that medicine right away.  Return to the clinic or go to the nearest emergency room if any of your symptoms worsen or new symptoms occur.  I would still recommend the shingles vaccine at age 57.   Shingles Shingles, which is also known as herpes zoster, is an infection that causes a painful skin rash and fluid-filled blisters. Shingles is not related to genital herpes, which is a sexually transmitted infection. Shingles only develops in people who:  Have had chickenpox.  Have received the chickenpox vaccine. (This is rare.) What are the causes? Shingles is caused by varicella-zoster virus (VZV). This is the same virus that causes chickenpox. After exposure to VZV, the virus stays in the body in an inactive (dormant) state. Shingles develops if the virus reactivates. This can happen many years after the initial exposure to VZV. It is not known what causes this virus to reactivate. What increases the risk? People who have had chickenpox or received the chickenpox vaccine are at risk for shingles. Infection is more common in people who:  Are older than age 57.  Have a weakened defense (immune) system, such as those with HIV, AIDS, or cancer.  Are taking medicines that weaken the immune system, such as transplant medicines.  Are under great stress. What are the signs or symptoms? Early symptoms of this condition include itching, tingling, and pain in an area on your skin. Pain may be described as burning, stabbing, or throbbing. A few days or weeks after symptoms start, a painful red rash appears, usually on one side of the body in a bandlike or beltlike pattern. The  rash eventually turns into fluid-filled blisters that break open, scab over, and dry up in about 2-3 weeks. At any time during the infection, you may also develop:  A fever.  Chills.  A headache.  An upset stomach. How is this diagnosed? This condition is diagnosed with a skin exam. Sometimes, skin or fluid samples are taken from the blisters before a diagnosis is made. These samples are examined under a microscope or sent to a lab for testing. How is this treated? There is no specific cure for this condition. Your health care provider will probably prescribe medicines to help you manage pain, recover more quickly, and avoid long-term problems. Medicines may include:  Antiviral drugs.  Anti-inflammatory drugs.  Pain medicines. If the area involved is on your face, you may be referred to a specialist, such as an eye doctor (ophthalmologist) or an ear, nose, and throat (ENT) doctor to help you avoid eye problems, chronic pain, or disability. Follow these instructions at home: Medicines  Take medicines only as directed by your health care provider.  Apply an anti-itch or numbing cream to the affected area as directed by your health care provider. Blister and Rash Care  Take a cool bath or apply cool compresses to the area of the rash or blisters as directed by your health care provider. This may help with pain and itching.  Keep your rash covered with a loose bandage (dressing). Wear loose-fitting clothing to help ease the pain of material rubbing against the rash.  Keep your rash and blisters  clean with mild soap and cool water or as directed by your health care provider.  Check your rash every day for signs of infection. These include redness, swelling, and pain that lasts or increases.  Do not pick your blisters.  Do not scratch your rash. General instructions  Rest as directed by your health care provider.  Keep all follow-up visits as directed by your health care  provider. This is important.  Until your blisters scab over, your infection can cause chickenpox in people who have never had it or been vaccinated against it. To prevent this from happening, avoid contact with other people, especially:  Babies.  Pregnant women.  Children who have eczema.  Elderly people who have transplants.  People who have chronic illnesses, such as leukemia or AIDS. Contact a health care provider if:  Your pain is not relieved with prescribed medicines.  Your pain does not get better after the rash heals.  Your rash looks infected. Signs of infection include redness, swelling, and pain that lasts or increases. Get help right away if:  The rash is on your face or nose.  You have facial pain, pain around your eye area, or loss of feeling on one side of your face.  You have ear pain or you have ringing in your ear.  You have loss of taste.  Your condition gets worse. This information is not intended to replace advice given to you by your health care provider. Make sure you discuss any questions you have with your health care provider. Document Released: 11/15/2005 Document Revised: 07/11/2016 Document Reviewed: 09/26/2014 Elsevier Interactive Patient Education  2017 [employer]      IF you received an x-ray today, you will receive an invoice from [facility]. Please contact [facility] at [phone] with questions or concerns regarding your invoice.   IF you received labwork today, you will receive an invoice from [facility]. Please contact [facility] at [phone] with questions or concerns regarding your invoice.   Our billing staff will not be able to assist you with questions regarding bills from these companies.  You will be contacted with the lab results as soon as they are available. The fastest way to get your results is to activate your My Chart account. Instructions are located on the last page  of this paperwork. If you have not heard from us regarding the results in 2 weeks, please contact this office.

## 2016-10-24 ENCOUNTER — Telehealth: Payer: Self-pay | Admitting: Physician Assistant

## 2016-10-24 NOTE — Telephone Encounter (Signed)
Diagnosed with shingles yesterday. Isn't sure when she can return to work. Discussed risks of varicella to non-immune people, including pregnant women and unvaccinated children. May return to work when all lesions crusted, now new lesions x 24 hours.

## 2016-10-27 ENCOUNTER — Encounter: Payer: Self-pay | Admitting: Physician Assistant

## 2016-11-29 HISTORY — PX: FOOT SURGERY: SHX648

## 2016-12-01 ENCOUNTER — Other Ambulatory Visit: Payer: Self-pay | Admitting: Physician Assistant

## 2016-12-01 DIAGNOSIS — I10 Essential (primary) hypertension: Secondary | ICD-10-CM

## 2016-12-13 ENCOUNTER — Encounter: Payer: Self-pay | Admitting: Gastroenterology

## 2016-12-17 ENCOUNTER — Ambulatory Visit: Payer: 59 | Admitting: Internal Medicine

## 2017-03-10 ENCOUNTER — Other Ambulatory Visit: Payer: Self-pay | Admitting: Physician Assistant

## 2017-03-10 DIAGNOSIS — I10 Essential (primary) hypertension: Secondary | ICD-10-CM

## 2017-04-11 ENCOUNTER — Other Ambulatory Visit: Payer: Self-pay | Admitting: Physician Assistant

## 2017-04-11 DIAGNOSIS — I10 Essential (primary) hypertension: Secondary | ICD-10-CM

## 2017-04-12 NOTE — Telephone Encounter (Signed)
30-day supply. Needs OV for more. Patient notified via My Chart.  Meds ordered this encounter  Medications  . nadolol (CORGARD) 40 MG tablet    Sig: TAKE 1 TABLET (40 MG TOTAL) BY MOUTH DAILY    Dispense:  30 tablet    Refill:  0  . triamterene-hydrochlorothiazide (MAXZIDE-25) 37.5-25 MG tablet    Sig: TAKE 1 TABLET BY MOUTH DAILY    Dispense:  30 tablet    Refill:  0

## 2017-05-11 ENCOUNTER — Other Ambulatory Visit: Payer: Self-pay | Admitting: Physician Assistant

## 2017-05-11 DIAGNOSIS — I10 Essential (primary) hypertension: Secondary | ICD-10-CM

## 2017-05-12 NOTE — Telephone Encounter (Signed)
mychart message sent to patient about making an appointment for med refills

## 2017-05-12 NOTE — Telephone Encounter (Signed)
Please call patient. Rx authorized. Needs to schedule follow-up.  Meds ordered this encounter  Medications  . nadolol (CORGARD) 40 MG tablet    Sig: TAKE 1 TABLET (40 MG TOTAL) BY MOUTH DAILY    Dispense:  30 tablet    Refill:  0  . triamterene-hydrochlorothiazide (MAXZIDE-25) 37.5-25 MG tablet    Sig: TAKE 1 TABLET BY MOUTH DAILY    Dispense:  30 tablet    Refill:  0

## 2017-06-10 ENCOUNTER — Other Ambulatory Visit: Payer: Self-pay | Admitting: Physician Assistant

## 2017-06-10 DIAGNOSIS — I10 Essential (primary) hypertension: Secondary | ICD-10-CM

## 2017-06-11 NOTE — Telephone Encounter (Signed)
Patient notified via My Chart.  Meds ordered this encounter  Medications  . triamterene-hydrochlorothiazide (MAXZIDE-25) 37.5-25 MG tablet    Sig: TAKE 1 TABLET BY MOUTH DAILY    Dispense:  30 tablet    Refill:  0  . nadolol (CORGARD) 40 MG tablet    Sig: TAKE 1 TABLET (40 MG TOTAL) BY MOUTH DAILY    Dispense:  30 tablet    Refill:  0    Needs OV for more.

## 2017-06-25 ENCOUNTER — Telehealth: Payer: Self-pay | Admitting: Physician Assistant

## 2017-06-25 NOTE — Telephone Encounter (Signed)
Please call patient. She has not read the My Chart message I sent on 7/14 notifying her that I authorized the requested refill, but that she needs a visit with me for additional fill, as it has been >1 year since her last visit.

## 2017-06-27 NOTE — Telephone Encounter (Signed)
Tried calling patient no answer I will send her a letter.

## 2017-07-09 ENCOUNTER — Other Ambulatory Visit: Payer: Self-pay | Admitting: Internal Medicine

## 2017-07-10 ENCOUNTER — Other Ambulatory Visit: Payer: Self-pay | Admitting: Physician Assistant

## 2017-07-10 DIAGNOSIS — I10 Essential (primary) hypertension: Secondary | ICD-10-CM

## 2017-07-11 NOTE — Telephone Encounter (Signed)
A letter has been sent to patient along with several call attempts and mychart messages, patient is not responding maybe we should try having the pharmacy tell her when she calls them to get the refills.

## 2017-07-24 ENCOUNTER — Other Ambulatory Visit: Payer: Self-pay | Admitting: Physician Assistant

## 2017-07-24 DIAGNOSIS — I10 Essential (primary) hypertension: Secondary | ICD-10-CM

## 2017-07-31 ENCOUNTER — Other Ambulatory Visit: Payer: Self-pay | Admitting: Physician Assistant

## 2017-07-31 DIAGNOSIS — I10 Essential (primary) hypertension: Secondary | ICD-10-CM

## 2017-08-06 ENCOUNTER — Other Ambulatory Visit: Payer: Self-pay | Admitting: Physician Assistant

## 2017-08-06 DIAGNOSIS — I10 Essential (primary) hypertension: Secondary | ICD-10-CM

## 2017-09-27 ENCOUNTER — Ambulatory Visit (INDEPENDENT_AMBULATORY_CARE_PROVIDER_SITE_OTHER): Payer: 59 | Admitting: Physician Assistant

## 2017-09-27 ENCOUNTER — Encounter: Payer: Self-pay | Admitting: Physician Assistant

## 2017-09-27 VITALS — BP 130/82 | HR 68 | Temp 98.2°F | Resp 18 | Ht 62.0 in | Wt 230.2 lb

## 2017-09-27 DIAGNOSIS — K7469 Other cirrhosis of liver: Secondary | ICD-10-CM | POA: Diagnosis not present

## 2017-09-27 DIAGNOSIS — M545 Low back pain, unspecified: Secondary | ICD-10-CM

## 2017-09-27 DIAGNOSIS — I10 Essential (primary) hypertension: Secondary | ICD-10-CM | POA: Diagnosis not present

## 2017-09-27 DIAGNOSIS — E785 Hyperlipidemia, unspecified: Secondary | ICD-10-CM

## 2017-09-27 DIAGNOSIS — K219 Gastro-esophageal reflux disease without esophagitis: Secondary | ICD-10-CM | POA: Diagnosis not present

## 2017-09-27 DIAGNOSIS — M62838 Other muscle spasm: Secondary | ICD-10-CM | POA: Diagnosis not present

## 2017-09-27 DIAGNOSIS — J309 Allergic rhinitis, unspecified: Secondary | ICD-10-CM | POA: Diagnosis not present

## 2017-09-27 MED ORDER — TRIAMTERENE-HCTZ 37.5-25 MG PO TABS: 1.0000 | ORAL_TABLET | Freq: Every day | ORAL | 3 refills | Status: AC

## 2017-09-27 MED ORDER — NADOLOL 40 MG PO TABS: ORAL_TABLET | ORAL | 3 refills | Status: AC

## 2017-09-27 MED ORDER — ESOMEPRAZOLE MAGNESIUM 40 MG PO CPDR: 40.0000 mg | DELAYED_RELEASE_CAPSULE | ORAL | 3 refills | Status: DC

## 2017-09-27 MED ORDER — MONTELUKAST SODIUM 10 MG PO TABS: 10.0000 mg | ORAL_TABLET | Freq: Every day | ORAL | 3 refills | Status: DC

## 2017-09-27 MED ORDER — AZELASTINE HCL 0.1 % NA SOLN: 2.0000 | Freq: Two times a day (BID) | NASAL | 0 refills | Status: DC

## 2017-09-27 MED ORDER — CYCLOBENZAPRINE HCL 10 MG PO TABS
10.0000 mg | ORAL_TABLET | Freq: Three times a day (TID) | ORAL | 0 refills | Status: AC | PRN
Start: 2017-09-27 — End: ?

## 2017-09-27 NOTE — Progress Notes (Signed)
Subjective:    Patient ID: Melissa [name], female    DOB: 09/27/1961, 56 y.o.   MRN: [id number]  HPI  Melissa [name] is a 56 year old with a past medical history significant for hypertension, sarcoidosis, hyperlipidemia and steroid induced diabetes mellitus who presents today for follow-up of hypertension.  Melissa [name] presents with a chief complaint of sinus issues and allergies. Melissa [name] reports nasal congestion, sinus pressure and left sided headache started 4 days ago. Melissa [name] also reports she has left ear pain. She denies fevers or sore throat. She states she has an occasional dry cough. Melissa [name] states she has tried Mucinex with the recent onset of her symptoms which has helped. Melissa [name] reports she previously tried Zyrtec and Claritin for her allergies, but both did not help so she discontinued them. She states she also tried Flonase which did not improve her symptoms. Melissa [name] reports she will sometimes have nausea with nasal congestion.   Melissa [name] states she is compliant with her blood pressure medications and tolerating them well without any side effects. Other than sinus headaches has not had any headaches. Melissa [name] denies vision changes and her next optometry appointment is in December. She denies chest pain or dyspnea. Melissa [name] reports denies orthopnea or PND. Melissa [name] denies urinary frequency or hematuria. Melissa [name] denies peripheral edema.   Melissa [name] states she thinks she eats pretty well. She will occasionally eat breakfast, she will get fast food for lunch, and usually will go out for dinner. She reports she mostly eats well-balanced meals. Melissa [name] states she drinks 64oz of water every day. She will drink soda on occasion. Melissa [name] denies alcohol use. She denies use of tobacco products. She also use of illicit drugs.   Melissa [name] reports over 10 years ago she was diagnosed with sarcoidosis due to blood work, but did not have any physical symptoms. She  reports she still has not had any symptoms.   Melissa [name]. [place] states she was diagnosed with purpura in May 2016. She had a course of oral steroids and had an episode of passing out where she was told her blood glucose was over 900. She was on Metformin, Novolog, and Lantus but her numbers continued to be controlled but she was tapered off Novolog and Lantus. Patient reports Dr. Elvera Lennox states that she no longer has a diagnosis of type 2 diabetes. Melissa [name]. [place] reports that she is no longer taking Metformin as well. Melissa [name]. [place] reports she continues to check blood glucose levels which are 62-86. She denies dizziness or headaches.   Medications:  Prior to Admission medications   Medication Sig Start Date End Date Taking? Authorizing Provider  esomeprazole (NEXIUM) 40 MG capsule Take 1 capsule (40 mg total) by mouth 1 day or 1 dose. 11/11/15  Yes Jeffery, Chelle, PA-C  nadolol (CORGARD) 40 MG tablet TAKE 1 TABLET (40 MG TOTAL) BY MOUTH DAILY 06/11/17  Yes Jeffery, Chelle, PA-C  triamterene-hydrochlorothiazide (MAXZIDE-25) 37.5-25 MG tablet TAKE 1 TABLET BY MOUTH DAILY 06/11/17  Yes Jeffery, Chelle, PA-C  cetirizine (ZYRTEC) 10 MG tablet Take 1 tablet (10 mg total) by mouth daily. Patient not taking: Reported on 10/23/2016 06/05/15   Trena Platt D, PA  cyclobenzaprine (FLEXERIL) 10 MG tablet Take 1 tablet (10 mg total) by mouth 3 (three) times daily as needed for muscle spasms. Patient not taking: Reported on 10/23/2016 03/10/16   Porfirio Oar, PA-C  glucose blood (ONETOUCH VERIO) test strip Use to test  blood sugar 3 times a week as instructed. Dx: E11.9 Patient not taking: Reported on 10/23/2016 06/18/16   Carlus Pavlov, MD  ipratropium (ATROVENT) 0.06 % nasal spray Place 2 sprays into both nostrils at bedtime as needed for rhinitis. Patient not taking: Reported on 10/23/2016 11/11/15   Porfirio Oar, PA-C  lisinopril (PRINIVIL,ZESTRIL) 2.5 MG tablet TAKE 1 TABLET (2.5 MG TOTAL) BY MOUTH  DAILY. Patient not taking: Reported on 10/23/2016 07/06/16   Porfirio Oar, PA-C  metFORMIN (GLUCOPHAGE) 500 MG tablet TAKE 1 TABLET (500 MG TOTAL) BY MOUTH DAILY WITH SUPPER. Patient not taking: Reported on 09/27/2017 07/11/17   Carlus Pavlov, MD  [facility] DELICA LANCETS FINE MISC Use to test blood sugar 3 times a week as instructed. Dx: E11.9 Patient not taking: Reported on 10/23/2016 01/12/16   Carlus Pavlov, MD  traMADol (ULTRAM) 50 MG tablet Take 1 tablet (50 mg total) by mouth every 6 (six) hours as needed. Patient not taking: Reported on 09/27/2017 10/23/16   Shade Flood, MD  valACYclovir (VALTREX) 1000 MG tablet Take 1 tablet (1,000 mg total) by mouth 3 (three) times daily. Patient not taking: Reported on 09/27/2017 10/23/16   Shade Flood, MD   Allergies:  Allergies  Allergen Reactions  . Codeine Nausea And Vomiting  . Erythromycin Nausea And Vomiting  . Ketek [Telithromycin] Nausea And Vomiting  . Oxycodone-Acetaminophen Nausea And Vomiting  . Penicillins Nausea And Vomiting  . Propoxyphene N-Acetaminophen Nausea And Vomiting  . Sulfonamide Derivatives Nausea And Vomiting  . Vicodin [Hydrocodone-Acetaminophen] Nausea And Vomiting   Chronic Medical Conditions:  Patient Active Problem List   Diagnosis Date Noted  . Hyperlipidemia 11/11/2015  . History of esophageal varices 12/13/2014  . HTN (hypertension) 05/07/2012  . GERD (gastroesophageal reflux disease) 05/07/2012  . AR (allergic rhinitis) 05/07/2012  . BMI 37.0-37.9, adult 05/07/2012  . Hepatic cirrhosis (HCC) 11/25/2009  . SARCOIDOSIS 11/10/2009  . CARDIAC MURMUR 11/10/2009   Review of Systems     Objective:   Physical Exam  Constitutional: She appears well-developed and well-nourished. She is active and cooperative.  BP 130/82   Pulse 68   Temp 98.2 F (36.8 C) (Oral)   Resp 18   Ht 5\' 2"  (1.575 m)   Wt 230 lb 3.2 oz (104.4 kg)   SpO2 100%   BMI 42.10 kg/m    HENT:  Head:  Normocephalic and atraumatic.  Right Ear: Tympanic membrane, external ear and ear canal normal. Tympanic membrane is not erythematous and not bulging. No middle ear effusion.  Left Ear: Tympanic membrane, external ear and ear canal normal. Tympanic membrane is not erythematous and not bulging.  No middle ear effusion.  Ears:  Nose: No rhinorrhea.    Mouth/Throat: Uvula is midline and oropharynx is clear and moist. No oropharyngeal exudate or posterior oropharyngeal erythema.  Eyes: Conjunctivae are normal. No scleral icterus.  Neck: Normal range of motion. Neck supple. No thyromegaly present.  Cardiovascular: Normal rate, regular rhythm and normal heart sounds.   Pulses:      Radial pulses are 2+ on the right side, and 2+ on the left side.       Dorsalis pedis pulses are 2+ on the right side, and 2+ on the left side.  Pulmonary/Chest: Effort normal and breath sounds normal. She has no wheezes. She has no rales.  Lymphadenopathy:    She has no cervical adenopathy.  Neurological: She is alert.  Skin: Skin is warm and dry.      Assessment &  Plan:  1. Hypertension  - CMP obtained today in clinic - Continue Nadolol 40mg  daily  - Continue Triamterene-Hydrochlorothiazide 37.5-25mg  daily  - Encouraged patient to attempt to eat well-balanced meals that include fruits and vegetables, whole grains, lean proteins, and low sodium foods. Also encouraged patient to get at least 30 minutes of exercise 5 times per week.  - Return to clinic in 3 months for hypertension follow-up.  2.  Hyperlipidemia - Lipid panel obtained today in clinic - CMP obtained today in clinic  - Encouraged patient to attempt to eat well-balanced meals that include fruits and vegetables, whole grains, lean proteins, and low sodium foods. Also encouraged patient to get at least 30 minutes of exercise 5 times per week.  - Return to clinic in 3 months for hyperlipidemia follow-up.  3. Allergic rhinitis - Begin Azelastine 0.1%  nasal spray, 2 sprays in each nostril BID - Begin Singulair 10mg  daily at bedtime  - Instructed patient to also use nasal saline spray to hydrate the nasal mucosa.  - Patient to return to clinic if symptoms worsen or do not improve.  Judie Petitaylin Emmalina Espericueta, PA-S

## 2017-09-27 NOTE — Patient Instructions (Addendum)
Try the monteluKast (Singulair) and the azelastine nasal spray to see if that helps your chronic congestion. Use a saline nasal spray to hydrate the nasal mucosa.    IF you received an x-ray today, you will receive an invoice [facility]. Please contact [facility] at [phone] with questions or concerns regarding your invoice.   IF you received labwork today, you will receive an invoice from [facility]. Please contact [facility] at [phone] with questions or concerns regarding your invoice.   Our billing staff will not be able to assist you with questions regarding bills from these companies.  You will be contacted with the lab results as soon as they are available. The fastest way to get your results is to activate your My Chart account. Instructions are located on the last page of this paperwork. If you have not heard from us regarding the results in 2 weeks, please contact this office.

## 2017-09-27 NOTE — Progress Notes (Signed)
Patient ID: Melissa [name], female    DOB: 11/29/1961, 56 y.o.   MRN: [id number]  PCP: Porfirio Oar, PA-C  Chief Complaint  Patient presents with  . Medication Refill    nadolol and triamterene     Subjective:   Presents for evaluation of HTN.  Allergies remained uncontrolled with oral antihistamines and nasal steroid sprays, so she has stopped them. Used both Flonase and Rhinocort Aqua. She has used Singulair previously, but it was a very long time ago. She also used allergy shots in the past, but stopped when she was no longer allowed to self-administer at home. Sometimes severe nasal congestion causes nausea.  Diabetes was steroid induced (oral steroids were prescribed for treatment of purpura) and was managed by endocrinology, but she hasn't been there in 2017. She has had controlled glucose off treatment since then.  She reports never having had hyperlipidemia, though that diagnosis is on her problem list. I suspect that is due to having temporarily been prescribed a statin due to the diabetes, which is now resolved.  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 Arlyce Dice, who has retired.   Review of Systems  Constitutional: Negative.  Negative for chills, fatigue and fever.  HENT: Positive for congestion, ear pain, postnasal drip, rhinorrhea and sinus pressure. Negative for sore throat.   Eyes: Negative.  Negative for visual disturbance.  Respiratory: Positive for cough. Negative for chest tightness, shortness of breath and wheezing.   Cardiovascular: Negative for chest pain and palpitations.  Gastrointestinal: Negative for abdominal pain, diarrhea, nausea and vomiting.  Genitourinary: Negative for dysuria, frequency, hematuria and urgency.  Musculoskeletal: Negative for arthralgias and myalgias.  Skin: Negative for rash.  Allergic/Immunologic: Positive for environmental allergies. Negative for food allergies.  Neurological:  Positive for headaches ("sinus"). Negative for dizziness, tremors, seizures, syncope, facial asymmetry, speech difficulty, weakness, light-headedness and numbness.  Psychiatric/Behavioral: Negative for decreased concentration. The patient is not nervous/anxious.        Patient Active Problem List   Diagnosis Date Noted  . Low back pain 09/27/2017  . Hyperlipidemia 11/11/2015  . History of esophageal varices 12/13/2014  . HTN (hypertension) 05/07/2012  . GERD (gastroesophageal reflux disease) 05/07/2012  . AR (allergic rhinitis) 05/07/2012  . BMI 40.0-44.9, adult (HCC) 05/07/2012  . Hepatic cirrhosis (HCC) 11/25/2009  . SARCOIDOSIS 11/10/2009  . CARDIAC MURMUR 11/10/2009     Prior to Admission medications   Medication Sig Start Date End Date Taking? Authorizing Provider  esomeprazole (NEXIUM) 40 MG capsule Take 1 capsule (40 mg total) by mouth 1 day or 1 dose. 11/11/15  Yes Coyt Govoni, PA-C  nadolol (CORGARD) 40 MG tablet TAKE 1 TABLET (40 MG TOTAL) BY MOUTH DAILY 06/11/17  Yes Jace Fermin, PA-C  triamterene-hydrochlorothiazide (MAXZIDE-25) 37.5-25 MG tablet TAKE 1 TABLET BY MOUTH DAILY 06/11/17  Yes Leotis Shames, Shiza Thelen, PA-C     Allergies  Allergen Reactions  . Codeine Nausea And Vomiting  . Erythromycin Nausea And Vomiting  . Ketek [Telithromycin] Nausea And Vomiting  . Oxycodone-Acetaminophen Nausea And Vomiting  . Penicillins Nausea And Vomiting  . Propoxyphene N-Acetaminophen Nausea And Vomiting  . Sulfonamide Derivatives Nausea And Vomiting  . Vicodin [Hydrocodone-Acetaminophen] Nausea And Vomiting       Objective:  Physical Exam  Constitutional: She is oriented to person, place, and time. She appears well-developed and well-nourished. She is active and cooperative. No distress.  BP 130/82   Pulse 68   Temp  98.2 F (36.8 C) (Oral)   Resp 18   Ht 5\' 2"  (1.575 m)   Wt 230 lb 3.2 oz (104.4 kg)   SpO2 100%   BMI 42.10 kg/m   HENT:  Head: Normocephalic and  atraumatic.  Right Ear: Hearing, tympanic membrane, external ear and ear canal normal.  Left Ear: Hearing, tympanic membrane and ear canal normal. There is tenderness (with introduction of the otoscope into the ear canal).  Nose: Mucosal edema (mild, with erythema) present. No rhinorrhea, nose lacerations, sinus tenderness, nasal deformity, septal deviation or nasal septal hematoma. No epistaxis.  No foreign bodies. Right sinus exhibits no maxillary sinus tenderness and no frontal sinus tenderness. Left sinus exhibits no maxillary sinus tenderness and no frontal sinus tenderness.  Mouth/Throat: Uvula is midline, oropharynx is clear and moist and mucous membranes are normal. No oral lesions.  Eyes: Conjunctivae are normal. No scleral icterus.  Neck: Normal range of motion. Neck supple. No thyromegaly present.  Cardiovascular: Normal rate, regular rhythm and normal heart sounds.  Pulses:      Radial pulses are 2+ on the right side, and 2+ on the left side.  Pulmonary/Chest: Effort normal and breath sounds normal.  Lymphadenopathy:       Head (right side): No tonsillar, no preauricular, no posterior auricular and no occipital adenopathy present.       Head (left side): No tonsillar, no preauricular, no posterior auricular and no occipital adenopathy present.    She has no cervical adenopathy.       Right: No supraclavicular adenopathy present.       Left: No supraclavicular adenopathy present.  Neurological: She is alert and oriented to person, place, and time. No sensory deficit.  Skin: Skin is warm, dry and intact. No rash noted. No cyanosis or erythema. Nails show no clubbing.  Psychiatric: She has a normal mood and affect. Her speech is normal and behavior is normal.           Assessment & Plan:   Problem List Items Addressed This Visit    Hepatic cirrhosis (HCC)    Recommend follow-up with GI.      Relevant Orders   Comprehensive metabolic panel (Completed)   HTN (hypertension)  - Primary    Controlled. Continue current treatment. Encouraged healthy eating and regular exercise.      Relevant Medications   triamterene-hydrochlorothiazide (MAXZIDE-25) 37.5-25 MG tablet   nadolol (CORGARD) 40 MG tablet   Other Relevant Orders   CBC with Differential/Platelet (Completed)   Comprehensive metabolic panel (Completed)   GERD (gastroesophageal reflux disease)    Controlled.      Relevant Medications   esomeprazole (NEXIUM) 40 MG capsule   AR (allergic rhinitis)    Uncontrolled. ADD azelastine NS, with saline NS in between to moisturize the nasal tissues, and ADD montelukast.      Relevant Medications   azelastine (ASTELIN) 0.1 % nasal spray   montelukast (SINGULAIR) 10 MG tablet   Hyperlipidemia    Await labs. Adjust regimen as indicated by results.      Relevant Medications   triamterene-hydrochlorothiazide (MAXZIDE-25) 37.5-25 MG tablet   nadolol (CORGARD) 40 MG tablet   Other Relevant Orders   Comprehensive metabolic panel (Completed)   Lipid panel (Completed)   Low back pain    Stable. Refill cyclobenzaprine for PRN use.      Relevant Medications   cyclobenzaprine (FLEXERIL) 10 MG tablet    Other Visit Diagnoses    Trapezius muscle spasm  Relevant Medications   cyclobenzaprine (FLEXERIL) 10 MG tablet       Return in about 6 months (around 03/28/2018), or if symptoms worsen or fail to improve, for re-evaluation of blood pressure, allergies.   Fernande Brashelle S. Keosha Rossa, PA-C Primary Care at [facility]

## 2017-09-28 LAB — CBC WITH DIFFERENTIAL/PLATELET
Basophils Absolute: 0 10*3/uL (ref 0.0–0.2)
Basos: 0 %
EOS (ABSOLUTE): 0.2 10*3/uL (ref 0.0–0.4)
EOS: 3 %
HEMATOCRIT: 39.2 % (ref 34.0–46.6)
HEMOGLOBIN: 13.3 g/dL (ref 11.1–15.9)
IMMATURE GRANS (ABS): 0 10*3/uL (ref 0.0–0.1)
IMMATURE GRANULOCYTES: 0 %
LYMPHS ABS: 1.7 10*3/uL (ref 0.7–3.1)
LYMPHS: 33 %
MCH: 30.1 pg (ref 26.6–33.0)
MCHC: 33.9 g/dL (ref 31.5–35.7)
MCV: 89 fL (ref 79–97)
MONOCYTES: 5 %
Monocytes Absolute: 0.3 10*3/uL (ref 0.1–0.9)
Neutrophils Absolute: 3 10*3/uL (ref 1.4–7.0)
Neutrophils: 59 %
Platelets: 211 10*3/uL (ref 150–379)
RBC: 4.42 x10E6/uL (ref 3.77–5.28)
RDW: 13.8 % (ref 12.3–15.4)
WBC: 5.2 10*3/uL (ref 3.4–10.8)

## 2017-09-28 LAB — COMPREHENSIVE METABOLIC PANEL
ALBUMIN: 4.3 g/dL (ref 3.5–5.5)
ALT: 26 IU/L (ref 0–32)
AST: 33 IU/L (ref 0–40)
Albumin/Globulin Ratio: 1.3 (ref 1.2–2.2)
Alkaline Phosphatase: 130 IU/L — ABNORMAL HIGH (ref 39–117)
BUN/Creatinine Ratio: 13 (ref 9–23)
BUN: 18 mg/dL (ref 6–24)
Bilirubin Total: 1.1 mg/dL (ref 0.0–1.2)
CALCIUM: 10.1 mg/dL (ref 8.7–10.2)
CO2: 23 mmol/L (ref 20–29)
Chloride: 100 mmol/L (ref 96–106)
Creatinine, Ser: 1.42 mg/dL — ABNORMAL HIGH (ref 0.57–1.00)
GFR calc non Af Amer: 41 mL/min/{1.73_m2} — ABNORMAL LOW (ref >59)
GFR, EST AFRICAN AMERICAN: 47 mL/min/{1.73_m2} — AB (ref >59)
GLOBULIN, TOTAL: 3.2 g/dL (ref 1.5–4.5)
Glucose: 80 mg/dL (ref 65–99)
Potassium: 3.5 mmol/L (ref 3.5–5.2)
Sodium: 140 mmol/L (ref 134–144)
TOTAL PROTEIN: 7.5 g/dL (ref 6.0–8.5)

## 2017-09-28 LAB — LIPID PANEL
CHOL/HDL RATIO: 2.7 ratio (ref 0.0–4.4)
CHOLESTEROL TOTAL: 178 mg/dL (ref 100–199)
HDL: 65 mg/dL (ref >39)
LDL CALC: 99 mg/dL (ref 0–99)
TRIGLYCERIDES: 70 mg/dL (ref 0–149)
VLDL CHOLESTEROL CAL: 14 mg/dL (ref 5–40)

## 2017-10-05 NOTE — Assessment & Plan Note (Signed)
Await labs. Adjust regimen as indicated by results.  

## 2017-10-05 NOTE — Assessment & Plan Note (Signed)
Controlled.  

## 2017-10-05 NOTE — Assessment & Plan Note (Signed)
Stable. Refill cyclobenzaprine for PRN use.

## 2017-10-05 NOTE — Assessment & Plan Note (Signed)
Recommend follow up with GI 

## 2017-10-05 NOTE — Assessment & Plan Note (Signed)
Uncontrolled. ADD azelastine NS, with saline NS in between to moisturize the nasal tissues, and ADD montelukast.

## 2017-10-05 NOTE — Assessment & Plan Note (Signed)
Controlled. Continue current treatment. Encouraged healthy eating and regular exercise.

## 2017-11-07 ENCOUNTER — Other Ambulatory Visit: Payer: Self-pay | Admitting: Physician Assistant

## 2017-11-07 DIAGNOSIS — J309 Allergic rhinitis, unspecified: Secondary | ICD-10-CM

## 2017-12-06 ENCOUNTER — Other Ambulatory Visit: Payer: Self-pay | Admitting: Internal Medicine

## 2018-01-26 ENCOUNTER — Other Ambulatory Visit: Payer: Self-pay | Admitting: Physician Assistant

## 2018-01-26 DIAGNOSIS — J309 Allergic rhinitis, unspecified: Secondary | ICD-10-CM

## 2018-02-09 ENCOUNTER — Encounter: Payer: Self-pay | Admitting: Podiatry

## 2018-02-09 ENCOUNTER — Other Ambulatory Visit: Payer: Self-pay | Admitting: Podiatry

## 2018-02-09 ENCOUNTER — Ambulatory Visit (INDEPENDENT_AMBULATORY_CARE_PROVIDER_SITE_OTHER): Payer: 59

## 2018-02-09 ENCOUNTER — Ambulatory Visit: Payer: 59 | Admitting: Podiatry

## 2018-02-09 DIAGNOSIS — M2012 Hallux valgus (acquired), left foot: Secondary | ICD-10-CM

## 2018-02-09 DIAGNOSIS — M2042 Other hammer toe(s) (acquired), left foot: Secondary | ICD-10-CM | POA: Diagnosis not present

## 2018-02-09 DIAGNOSIS — M79672 Pain in left foot: Secondary | ICD-10-CM

## 2018-02-09 DIAGNOSIS — M2041 Other hammer toe(s) (acquired), right foot: Secondary | ICD-10-CM

## 2018-02-09 DIAGNOSIS — M779 Enthesopathy, unspecified: Secondary | ICD-10-CM

## 2018-02-09 MED ORDER — TRIAMCINOLONE ACETONIDE 10 MG/ML IJ SUSP
10.0000 mg | Freq: Once | INTRAMUSCULAR | Status: AC
  Administered 2018-02-09: 10 mg

## 2018-02-09 NOTE — Progress Notes (Signed)
Subjective:   Patient ID: Melissa [name], female   DOB: 57 y.o.   MRN: [id number]   HPI Patient states she is developed a lot of pain on top of her second toe left foot over the last few months and did wear shoes that irritated it.  States is been hard for her to wear shoes comfortably she is tried to soak it and use over-the-counter medication without relief.  Patient does not smoke and likes to be active   Review of Systems  All other systems reviewed and are negative.       Objective:  Physical Exam  Constitutional: She appears well-developed and well-nourished.  Cardiovascular: Intact distal pulses.  Pulmonary/Chest: Effort normal.  Musculoskeletal: Normal range of motion.  Neurological: She is alert.  Skin: Skin is warm.  Nursing note and vitals reviewed.   Neurovascular status intact muscle strength is adequate range of motion within normal limits with patient found to have mild elevation of the second toe left with the left hallux deviating underneath the second toe with keratotic lesion with fluid buildup of the interphalangeal joint digit to left with pain with palpation.  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  We discussed possibility for digital correction at one point future with probable Akin osteotomy but at this time are to try conservative treatment and see the response.  I did a proximal nerve block in the area I then injected carefully under sterile technique around the interphalangeal joint with 1 mg Dexasone Kenalog 2 mg Xylocaine and instructed on soaks and applied padding to reduce pressure on the toe.  Reappoint to recheck 4 weeks or earlier if needed  X-rays indicate moderate elevation second digit  left with moderate rigid contracture of the toe

## 2018-02-16 ENCOUNTER — Ambulatory Visit: Payer: 59 | Admitting: Podiatry

## 2018-02-16 ENCOUNTER — Encounter: Payer: Self-pay | Admitting: Podiatry

## 2018-02-16 DIAGNOSIS — M2041 Other hammer toe(s) (acquired), right foot: Secondary | ICD-10-CM | POA: Diagnosis not present

## 2018-02-16 DIAGNOSIS — M2042 Other hammer toe(s) (acquired), left foot: Secondary | ICD-10-CM

## 2018-02-16 DIAGNOSIS — M2012 Hallux valgus (acquired), left foot: Secondary | ICD-10-CM

## 2018-02-16 NOTE — Patient Instructions (Signed)
Pre-Operative Instructions  Congratulations, you have decided to take an important step towards improving your quality of life.  You can be assured that the doctors and staff at [facility] will be with you every step of the way.  Here are some important things you should know:  1. Plan to be at the surgery center/hospital at least 1 (one) hour prior to your scheduled time, unless otherwise directed by the surgical center/hospital staff.  You must have a responsible adult accompany you, remain during the surgery and drive you home.  Make sure you have directions to the surgical center/hospital to ensure you arrive on time. 2. If you are having surgery at [facility] or [facility] hospitals, you will need a copy of your medical history and physical form from your family physician within one month prior to the date of surgery. We will give you a form for your primary physician to complete.  3. We make every effort to accommodate the date you request for surgery.  However, there are times where surgery dates or times have to be moved.  We will contact you as soon as possible if a change in schedule is required.   4. No aspirin/ibuprofen for one week before surgery.  If you are on aspirin, any non-steroidal anti-inflammatory medications (Mobic, Aleve, Ibuprofen) should not be taken seven (7) days prior to your surgery.  You make take Tylenol for pain prior to surgery.  5. Medications - If you are taking daily heart and blood pressure medications, seizure, reflux, allergy, asthma, anxiety, pain or diabetes medications, make sure you notify the surgery center/hospital before the day of surgery so they can tell you which medications you should take or avoid the day of surgery. 6. No food or drink after midnight the night before surgery unless directed otherwise by surgical center/hospital staff. 7. No alcoholic beverages 24-hours prior to surgery.  No smoking 24-hours prior or 24-hours after  surgery. 8. Wear loose pants or shorts. They should be loose enough to fit over bandages, boots, and casts. 9. Don't wear slip-on shoes. Sneakers are preferred. 10. Bring your boot with you to the surgery center/hospital.  Also bring crutches or a walker if your physician has prescribed it for you.  If you do not have this equipment, it will be provided for you after surgery. 11. If you have not been contacted by the surgery center/hospital by the day before your surgery, call to confirm the date and time of your surgery. 12. Leave-time from work may vary depending on the type of surgery you have.  Appropriate arrangements should be made prior to surgery with your employer. 13. Prescriptions will be provided immediately following surgery by your doctor.  Fill these as soon as possible after surgery and take the medication as directed. Pain medications will not be refilled on weekends and must be approved by the doctor. 14. Remove nail polish on the operative foot and avoid getting pedicures prior to surgery. 15. Wash the night before surgery.  The night before surgery wash the foot and leg well with water and the antibacterial soap provided. Be sure to pay special attention to beneath the toenails and in between the toes.  Wash for at least three (3) minutes. Rinse thoroughly with water and dry well with a towel.  Perform this wash unless told not to do so by your physician.  Enclosed: 1 Ice pack (please put in freezer the night before surgery)   1 Hibiclens skin cleaner     Pre-op instructions  If you have any questions regarding the instructions, please do not hesitate to call our office.  [place]: [address], , [place] [address] -- [phone]  [place]: [address] [place], [place] [address] -- [phone]  [place]: [address]  [place], [place] [address] -- [phone]  [place]: [address], [address], [place], [place] [address] -- [phone]  Website:  [email or web address] 

## 2018-02-17 ENCOUNTER — Ambulatory Visit: Payer: 59 | Admitting: Podiatry

## 2018-03-02 NOTE — Progress Notes (Signed)
Subjective:   Patient ID: Melissa [name], female   DOB: 57 y.o.   MRN: [id number]   HPI Patient presents stating it seemed to help me what he did but I am still getting discomfort between the big toe and second toe left   ROS      Objective:  Physical Exam  Neurovascular status intact with deviation of the hallux against second toe with keratotic lesion is painful when palpated and no active drainage or other skin pathology     Assessment:  Appears to be in digital deformity with rotation of the hallux against the second toe with irritated type tissue     Plan:  I went ahead today and I discussed surgery and we allowed her to review consent form for a possible [place]iken osteotomy digital procedure I reviewed at great length will be required we discussed her sugar which is under excellent control and the fact she has good digital perfusion and after extensive review she signed consent form understanding risk and understands total recovery can take 6 months to 1 year.  Patient is to be scheduled for surgery at her convenience

## 2018-03-06 ENCOUNTER — Encounter: Payer: Self-pay | Admitting: Physician Assistant

## 2018-03-09 ENCOUNTER — Telehealth: Payer: Self-pay | Admitting: *Deleted

## 2018-03-09 NOTE — Telephone Encounter (Signed)
"  I received a text message stating that I have an appointment tomorrow at 3:15 pm.  I was not aware of that appointment. I was waiting on somebody to call me to tell me what time my surgery was going to be on April 18.  Call me back."  I attempted to return her call.  I see she has canceled the appointment for tomorrow that was scheduled with Dr. Charlsie Merlesegal.  The patient is scheduled for surgery on April 16 not April 18.

## 2018-03-10 ENCOUNTER — Ambulatory Visit: Payer: 59 | Admitting: Podiatry

## 2018-03-14 ENCOUNTER — Encounter: Payer: Self-pay | Admitting: Podiatry

## 2018-03-14 DIAGNOSIS — M2041 Other hammer toe(s) (acquired), right foot: Secondary | ICD-10-CM | POA: Diagnosis not present

## 2018-03-14 DIAGNOSIS — M2042 Other hammer toe(s) (acquired), left foot: Secondary | ICD-10-CM | POA: Diagnosis not present

## 2018-03-22 ENCOUNTER — Encounter: Payer: Self-pay | Admitting: Podiatry

## 2018-03-22 ENCOUNTER — Ambulatory Visit (INDEPENDENT_AMBULATORY_CARE_PROVIDER_SITE_OTHER): Payer: 59

## 2018-03-22 ENCOUNTER — Ambulatory Visit (INDEPENDENT_AMBULATORY_CARE_PROVIDER_SITE_OTHER): Payer: 59 | Admitting: Podiatry

## 2018-03-22 VITALS — BP 117/79 | HR 78 | Temp 97.4°F

## 2018-03-22 DIAGNOSIS — M2042 Other hammer toe(s) (acquired), left foot: Secondary | ICD-10-CM

## 2018-03-22 DIAGNOSIS — M2012 Hallux valgus (acquired), left foot: Secondary | ICD-10-CM | POA: Diagnosis not present

## 2018-03-22 DIAGNOSIS — M2041 Other hammer toe(s) (acquired), right foot: Secondary | ICD-10-CM

## 2018-03-22 MED ORDER — PROMETHAZINE HCL 25 MG PO TABS: 25.0000 mg | ORAL_TABLET | Freq: Three times a day (TID) | ORAL | 0 refills | Status: DC | PRN

## 2018-03-22 MED ORDER — MEPERIDINE HCL 50 MG PO TABS: 50.0000 mg | ORAL_TABLET | ORAL | 0 refills | Status: DC | PRN

## 2018-03-22 NOTE — Progress Notes (Signed)
Subjective:   Patient ID: Melissa [name], female   DOB: 57 y.o.   MRN: [id number]   HPI Patient states doing well with her foot but states the left one is somewhat sore   ROS      Objective:  Physical Exam  Neurovascular status intact negative Homans sign noted with patient's left hallux being straight left second digit in good position with pin in place and wound edges well coapted on these toes and also the fifth digit bilateral     Assessment:  Doing well post foot surgery bilateral     Plan:  X-rays reviewed reapplied sterile dressing advised on continued boot usage left and surgical shoe usage right and reappoint 2 weeks for stitch removal  X-ray indicates fixation is in place there is slight stress of the left big toe joint and advised her the importance of continued boot usage for several more weeks until we see her back for suture removal  Signed visit

## 2018-03-23 ENCOUNTER — Telehealth: Payer: Self-pay | Admitting: *Deleted

## 2018-03-23 NOTE — Telephone Encounter (Signed)
Refill request for promethazine. Dr. Charlsie Merlesegal refilled 03/22/2018.

## 2018-04-05 ENCOUNTER — Encounter: Payer: Self-pay | Admitting: Podiatry

## 2018-04-05 ENCOUNTER — Ambulatory Visit (INDEPENDENT_AMBULATORY_CARE_PROVIDER_SITE_OTHER): Payer: 59

## 2018-04-05 ENCOUNTER — Ambulatory Visit (INDEPENDENT_AMBULATORY_CARE_PROVIDER_SITE_OTHER): Payer: 59 | Admitting: Podiatry

## 2018-04-05 DIAGNOSIS — M779 Enthesopathy, unspecified: Secondary | ICD-10-CM

## 2018-04-05 DIAGNOSIS — M2042 Other hammer toe(s) (acquired), left foot: Secondary | ICD-10-CM

## 2018-04-05 DIAGNOSIS — M2041 Other hammer toe(s) (acquired), right foot: Secondary | ICD-10-CM

## 2018-04-05 MED ORDER — PROMETHAZINE HCL 25 MG PO TABS: 25.0000 mg | ORAL_TABLET | Freq: Three times a day (TID) | ORAL | 0 refills | Status: DC | PRN

## 2018-04-05 MED ORDER — MEPERIDINE HCL 50 MG PO TABS: 50.0000 mg | ORAL_TABLET | ORAL | 0 refills | Status: DC | PRN

## 2018-04-05 NOTE — Progress Notes (Signed)
Subjective:   Patient ID: Melissa [name], female   DOB: 57 y.o.   MRN: [id number]   HPI Patient presents stating I had some pain in my feet and the left foot especially has been bothering me.  Patient states that she has been elevating and using compression but she is had to be active due to her schedule   ROS      Objective:  Physical Exam  Neurovascular status intact negative Homans sign noted with patient's digits in good alignment pin in place second toe and big toe left in good alignment in conjunction to the second toe     Assessment:  Appears to be healing well with inflammation secondary to activity levels     Plan:  Reviewed condition and at this point recommended continuation of elevation compression and immobilization.  Reappoint 2 weeks for pin removal and stitches removed today with wound edges well coapted  X-ray indicates that the osteotomy and digital position is good with good rotation fifth digit right also noted.  Also wrote prescription for 20 more Demerol and Phenergan to be taken at nighttime

## 2018-04-19 ENCOUNTER — Ambulatory Visit (INDEPENDENT_AMBULATORY_CARE_PROVIDER_SITE_OTHER): Payer: 59 | Admitting: Podiatry

## 2018-04-19 ENCOUNTER — Ambulatory Visit (INDEPENDENT_AMBULATORY_CARE_PROVIDER_SITE_OTHER): Payer: 59

## 2018-04-19 DIAGNOSIS — M2041 Other hammer toe(s) (acquired), right foot: Secondary | ICD-10-CM

## 2018-04-19 DIAGNOSIS — M2042 Other hammer toe(s) (acquired), left foot: Secondary | ICD-10-CM

## 2018-04-20 NOTE — Progress Notes (Signed)
Subjective:   Patient ID: Melissa [name], female   DOB: 57 y.o.   MRN: [id number]   HPI Patient states overall doing well but I am ready to get this pin out of my second toe   ROS      Objective:  Physical Exam  Neurovascular status intact with good position of the left big toe with the second digit straight with pin in place at the current time     Assessment:  Doing well post surgery left with pin in place second digit and good alignment of the left hallux     Plan:  H&P pin removed second digit left sterile dressing applied and reviewed x-ray.  Patient may begin slowly wearing soft shoe gear and will be seen back 4 weeks or earlier if needed  X-ray indicates that there is good alignment of the second digit with slight elevation and the left hallux is healing well with slight elevation but the fixation is in place.  I also dispensed a BioSkin above ankle brace to provide compression and to lower the second toe left foot

## 2018-05-17 ENCOUNTER — Other Ambulatory Visit: Payer: 59 | Admitting: Podiatry

## 2018-05-18 ENCOUNTER — Ambulatory Visit (INDEPENDENT_AMBULATORY_CARE_PROVIDER_SITE_OTHER): Payer: 59

## 2018-05-18 ENCOUNTER — Ambulatory Visit (INDEPENDENT_AMBULATORY_CARE_PROVIDER_SITE_OTHER): Payer: 59 | Admitting: Podiatry

## 2018-05-18 ENCOUNTER — Encounter: Payer: Self-pay | Admitting: Podiatry

## 2018-05-18 ENCOUNTER — Other Ambulatory Visit: Payer: Self-pay | Admitting: Podiatry

## 2018-05-18 DIAGNOSIS — M2042 Other hammer toe(s) (acquired), left foot: Principal | ICD-10-CM

## 2018-05-18 DIAGNOSIS — M79671 Pain in right foot: Secondary | ICD-10-CM

## 2018-05-18 DIAGNOSIS — M2041 Other hammer toe(s) (acquired), right foot: Secondary | ICD-10-CM

## 2018-05-18 DIAGNOSIS — M79672 Pain in left foot: Secondary | ICD-10-CM

## 2018-05-19 NOTE — Progress Notes (Signed)
Subjective:   Patient ID: Melissa [name], female   DOB: 57 y.o.   MRN: [id number]   HPI Patient states overall doing well with occasional discomfort in the left foot if I try to wear shoe so so far I have not been wearing regular shoes   ROS      Objective:  Physical Exam  Neurovascular status intact with patient's left foot healing well wound edges well coapted hallux in rectus position     Assessment:  Doing well post foot surgery left     Plan:  Advised patient on continued physical therapy gradual return to soft shoe gear and continued reduced activity  X-ray indicates the osteotomy is healing well fixation in place and good positional component of the hallux second toe

## 2018-05-31 ENCOUNTER — Other Ambulatory Visit: Payer: Self-pay | Admitting: Internal Medicine

## 2018-05-31 NOTE — Telephone Encounter (Signed)
Refills per PCP  - I have not seen her in 2 years.

## 2018-05-31 NOTE — Telephone Encounter (Signed)
Last ov 06/18/16 1 canceled appointment and no future scheduled refill or refuse please advise

## 2018-07-12 ENCOUNTER — Ambulatory Visit (INDEPENDENT_AMBULATORY_CARE_PROVIDER_SITE_OTHER): Payer: 59

## 2018-07-12 ENCOUNTER — Ambulatory Visit (INDEPENDENT_AMBULATORY_CARE_PROVIDER_SITE_OTHER): Payer: 59 | Admitting: Podiatry

## 2018-07-12 ENCOUNTER — Encounter: Payer: Self-pay | Admitting: Podiatry

## 2018-07-12 DIAGNOSIS — M2041 Other hammer toe(s) (acquired), right foot: Secondary | ICD-10-CM | POA: Diagnosis not present

## 2018-07-12 DIAGNOSIS — M2012 Hallux valgus (acquired), left foot: Secondary | ICD-10-CM | POA: Diagnosis not present

## 2018-07-12 DIAGNOSIS — M2042 Other hammer toe(s) (acquired), left foot: Principal | ICD-10-CM

## 2018-07-13 NOTE — Progress Notes (Signed)
Subjective:   Patient ID: Melissa [name], female   DOB: 57 y.o.   MRN: [id number]   HPI Patient states overall doing pretty well with minimal pain but just was concerned because my toes were rotated slightly of the little toe so I wanted to make sure it was okay   ROS      Objective:  Physical Exam  Neurovascular status intact with digits in good alignment with mild rotation of the fifth digits bilateral but no corn callus formation     Assessment:  Overall doing well with mild structural changes to fifth digit     Plan:  X-ray were discussed and at this point I do think will be stable at this position will not get worse.  Patient's discharge will be seen back as needed  X-ray indicated satisfactory resection of bone with no signs of pathology

## 2019-06-08 ENCOUNTER — Other Ambulatory Visit: Payer: Self-pay

## 2019-06-08 NOTE — Progress Notes (Signed)
Prescreened pt for appt on 7-13.  Pt had labs at [facility] in June 2020.  Requested from Dr. Chales Abrahams office on 7-10.

## 2019-06-11 ENCOUNTER — Ambulatory Visit (INDEPENDENT_AMBULATORY_CARE_PROVIDER_SITE_OTHER): Payer: 59 | Admitting: Gastroenterology

## 2019-06-11 ENCOUNTER — Encounter: Payer: Self-pay | Admitting: Gastroenterology

## 2019-06-11 DIAGNOSIS — I8500 Esophageal varices without bleeding: Secondary | ICD-10-CM

## 2019-06-11 DIAGNOSIS — R188 Other ascites: Secondary | ICD-10-CM | POA: Diagnosis not present

## 2019-06-11 DIAGNOSIS — Z1211 Encounter for screening for malignant neoplasm of colon: Secondary | ICD-10-CM | POA: Diagnosis not present

## 2019-06-11 DIAGNOSIS — K7460 Unspecified cirrhosis of liver: Secondary | ICD-10-CM

## 2019-06-11 NOTE — Patient Instructions (Signed)
If you are age 60 or older, your body mass index should be between 23-30. Your There is no height or weight on file to calculate BMI. If this is out of the aforementioned range listed, please consider follow up with your Primary Care Provider.  If you are age 37 or younger, your body mass index should be between 19-25. Your There is no height or weight on file to calculate BMI. If this is out of the aformentioned range listed, please consider follow up with your Primary Care Provider.   To help prevent the possible spread of infection to our patients, communities, and staff; we will be implementing the following measures:  As of now we are not allowing any visitors/family members to accompany you to any upcoming appointments with [facility]. If you have any concerns about this please contact our office to discuss prior to the appointment.   Please go to the lab in the basement of our building to have lab work. Hit "B" for basement when you get on the elevator.  When the doors open the lab is on your left.  We will call you with the results. Thank you.  You have been scheduled for an abdominal ultrasound of your liver at [facility] (1st floor of hospital) on Friday, 7-17 at 9:30am. Please arrive 15 minutes prior to your appointment for registration. Make certain not to have anything to eat or drink 6 hours prior to your appointment. Should you need to reschedule your appointment, please contact radiology at [phone]. This test typically takes about 30 minutes to perform.   You have been scheduled for a CT Virtual Colonoscopy on Monday, 7-27 at 9:30am at [facility]. They are located at [address].  Please arrive 15 minutes prior to your appointment. You will need to go by their office at least 4 days prior to the day of your appointment to pick up contrast for the procedure. If you need to contact [facility] their number is [phone].    A Virtual colonoscopy is a medical imaging procedure which uses x-rays and computers to produce two- and three-dimensional images of the colon from the lowest part, the rectum, all the way to the lower end of the small intestine and display them on a screen. A Virtual colonoscopy can show ulcers, polyps and cancer.    Thank you for entrusting me with your care and for choosing [facility], Dr. [place] Cellar

## 2019-06-11 NOTE — Progress Notes (Signed)
Virtual Visit via Video Note  I connected with Melissa [name] on 06/11/19 at  2:00 PM EDT by a video enabled telemedicine application and verified that I am speaking with the correct person using two identifiers.  I discussed the limitations of evaluation and management by telemedicine and the availability of in person appointments. The patient expressed understanding and agreed to proceed.  THIS ENCOUNTER IS A VIRTUAL VISIT DUE TO COVID-19 - PATIENT WAS NOT SEEN IN THE OFFICE. PATIENT HAS CONSENTED TO VIRTUAL VISIT / TELEMEDICINE VISIT USING [employer] APP   Location of patient: home Location of provider: office Name of referring provider: Bernerd Limbo MD Persons participating: myself, patient   HPI :  58 y/o female here for a new patient visit with me, referred by Dr. Coletta [name] for a history of cirrhosis. She has previously seen Dr. Deatra [name] for cirrhosis of the liver, records dating back to 2007 although only the last clinic visit in 2016 is available for review. 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 Zadie [name]) remotely. Patient denies any history of a liver biopsy. She actually is unaware of a prior diagnosis of sarcoidosis. She denies being on steroids in the past. She has a reported history of esophageal varices dating back to 2012, since that time she has been on nadolol. She has continued this but has not seen a GI physician since 2016. I do not see any prior serologies for chronic liver diseases that are available in the records we have on her.   She has no other decompensations to date - no jaundice, ascites, edema, hepatic encephalopathy. She has not had imaging of her liver since 2010 in our records. No alcohol use at all currently or remotely. No FH of liver disease. No FH of colon cancer  No trouble with bowel habits or abdominal pains. No blood in her stools. Her last colonoscopy was  done in 2016 was an aborted exam, could not traverse sigmoid colon due to angulated turn / diverticulosis. She had a barium enema at that time which was normal, but she has not had screening completed since.   Most recent endoscopic workup: EGD 01/16/15 - no varices, normal exam Colonoscopy 01/16/15 - procedure aborted in the sigmoid colon due to angulated turn /diverticulosis  Labs careeverywhere: 05/04/19: Hgb 13.4, MCV 91.7, plt 208, WBC 4.3 BUN 18, CR 1.17, T bil 0.9, AP 127, AST 33, ALT 31    Past Medical History:  Diagnosis Date  . Acute renal failure (Melissa [name]) 06/11/2015  . Allergy    sinus allergies  . Chronic kidney disease (CKD), stage III (moderate) (Melissa [name])    Archie Endo 06/11/2015  . Diabetes mellitus without complication (Melissa [name]) dx'd 7/82/4235   Archie Endo 06/11/2015  . Esophageal varices (Melissa [name])   . GERD (gastroesophageal reflux disease)   . Heart murmur   . Hypertension      Past Surgical History:  Procedure Laterality Date  . ABDOMINAL HYSTERECTOMY  1997   "partial"  . CARPAL TUNNEL RELEASE Right 1987  . CESAREAN SECTION  1979  . FOOT SURGERY Bilateral 2018  . REDUCTION MAMMAPLASTY Bilateral 2005  . TUBAL LIGATION  1981   Family History  Problem Relation Age of Onset  . Hypertension Mother   . Hypertension Brother   . Kidney disease Brother        ESRD on Dialysis  . Diabetes Brother   . CAD Sister   . Colon cancer  Neg Hx   . Esophageal cancer Neg Hx   . Rectal cancer Neg Hx   . Stomach cancer Neg Hx    Social History   Tobacco Use  . Smoking status: Never Smoker  . Smokeless tobacco: Never Used  Substance Use Topics  . Alcohol use: No    Alcohol/week: 0.0 standard drinks  . Drug use: No   Current Outpatient Medications  Medication Sig Dispense Refill  . azelastine (ASTELIN) 0.1 % nasal spray Place 2 sprays into both nostrils 2 (two) times daily. Use in each nostril as directed (Patient taking differently: Place 2 sprays into both nostrils 2 (two) times daily as  needed. Use in each nostril as directed) 30 mL 0  . cyclobenzaprine (FLEXERIL) 10 MG tablet Take 1 tablet (10 mg total) by mouth 3 (three) times daily as needed for muscle spasms. 30 tablet 0  . esomeprazole (NEXIUM) 40 MG capsule Take 1 capsule (40 mg total) by mouth 1 day or 1 dose. (Patient not taking: Reported on 06/08/2019) 90 capsule 3  . guaiFENesin (MUCINEX) 600 MG 12 hr tablet Take by mouth 2 (two) times daily as needed.    . nadolol (CORGARD) 40 MG tablet TAKE 1 TABLET (40 MG TOTAL) BY MOUTH DAILY 90 tablet 3  . triamterene-hydrochlorothiazide (MAXZIDE-25) 37.5-25 MG tablet Take 1 tablet by mouth daily. 90 tablet 3   No current facility-administered medications for this visit.    Allergies  Allergen Reactions  . Codeine Nausea And Vomiting  . Erythromycin Nausea And Vomiting  . Ketek [Telithromycin] Nausea And Vomiting  . Oxycodone-Acetaminophen Nausea And Vomiting  . Penicillins Nausea And Vomiting  . Propoxyphene N-Acetaminophen Nausea And Vomiting  . Sulfonamide Derivatives Nausea And Vomiting  . Vicodin [Hydrocodone-Acetaminophen] Nausea And Vomiting     Review of Systems: All systems reviewed and negative except where noted in HPI.   Labs per HPI  Physical Exam: There were no vitals taken for this visit. 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 She denies any knowledge of a history of sarcoidosis, no prior liver biopsy or steroid use per her report, unclear of how this diagnosis was made but she did see transplant hepatology at one point in time. She does not use alcohol, her liver enzymes are currently normal and platelets normal. She has no clinical decompensations right now, overall appears to be doing well. We discussed cirrhosis, long term risks for decompensation and HCC. She is overdue  for surveillance imaging, will send for RUQ US and AFP level. Also due for INR and will check labs for chronic liver disease as I don't have any records of them being done, sounds like this was a long time ago, and check for immunity to hep A and B and vaccinate if needed. She does carry a remote history of esophageal varices, tolerating nadolol. If she wishes to continue this, no further surveillance EGD is needed. Will await labs, imaging, with further recommendations. Counseled her she needs to follow up with us every 6 months for surveillance purposes. She agreed.   Colon cancer screening - aborted colonoscopy due to severe sigmoid stricturing / diverticulosis in 2016, no screening since. I think a virtual colonoscopy is reasonable to exclude any right sided lesion and complete her screening. If she does have a polyp there, would  have to consider repeat optical colonoscopy using ultrathin scope perhaps. She wanted to proceed with VC after discussion of it, further recommendations pending the result.   Ileene PatrickSteven Candice Tobey, MD [facility]  CC: Tracey HarriesBouska, David, MD

## 2019-06-15 ENCOUNTER — Other Ambulatory Visit: Payer: Self-pay

## 2019-06-15 ENCOUNTER — Ambulatory Visit (HOSPITAL_COMMUNITY)
Admission: RE | Admit: 2019-06-15 | Discharge: 2019-06-15 | Disposition: A | Discharge status: Home / Self Care | Payer: 59 | Source: Ambulatory Visit | Attending: Gastroenterology | Admitting: Gastroenterology

## 2019-06-15 ENCOUNTER — Other Ambulatory Visit (INDEPENDENT_AMBULATORY_CARE_PROVIDER_SITE_OTHER): Payer: 59

## 2019-06-15 DIAGNOSIS — Z1211 Encounter for screening for malignant neoplasm of colon: Secondary | ICD-10-CM

## 2019-06-15 DIAGNOSIS — I8500 Esophageal varices without bleeding: Secondary | ICD-10-CM | POA: Insufficient documentation

## 2019-06-15 DIAGNOSIS — K7460 Unspecified cirrhosis of liver: Secondary | ICD-10-CM

## 2019-06-15 DIAGNOSIS — R188 Other ascites: Secondary | ICD-10-CM | POA: Diagnosis not present

## 2019-06-15 LAB — PROTIME-INR
INR: 1 ratio (ref 0.8–1.0)
Prothrombin Time: 12 s (ref 9.6–13.1)

## 2019-06-18 ENCOUNTER — Encounter: Payer: Self-pay | Admitting: Physician Assistant

## 2019-06-18 ENCOUNTER — Telehealth: Payer: 59 | Admitting: Physician Assistant

## 2019-06-18 ENCOUNTER — Other Ambulatory Visit: Payer: Self-pay

## 2019-06-18 DIAGNOSIS — H539 Unspecified visual disturbance: Secondary | ICD-10-CM

## 2019-06-18 DIAGNOSIS — R42 Dizziness and giddiness: Secondary | ICD-10-CM

## 2019-06-18 DIAGNOSIS — K7460 Unspecified cirrhosis of liver: Secondary | ICD-10-CM

## 2019-06-18 DIAGNOSIS — G4489 Other headache syndrome: Secondary | ICD-10-CM

## 2019-06-18 DIAGNOSIS — H9202 Otalgia, left ear: Secondary | ICD-10-CM

## 2019-06-18 NOTE — Progress Notes (Signed)
Based on what you shared with me, I feel your condition warrants further evaluation and I recommend that you be seen for a face to face office visit.  NOTE: If you entered your credit card information for this eVisit, you will not be charged. You may see a "hold" on your card for the $35 but that hold will drop off and you will not have a charge processed.  Due to visual changes, it is advised you have a face to face evaluation of your symptoms.   If you are having a true medical emergency please call 911.     For an urgent face to face visit, [place] has five urgent care centers for your convenience:    [email or web address] to reserve your spot online an avoid wait times  Melissa [name] (New Address!) [address] [place], [place] [address] *Just off [address], across the road from [place] hours of operation: Monday-Friday, 12 PM to 6 PM  Closed Saturday & Sunday   The following sites will take your insurance:  . [facility]    [phone]                  Get Driving Directions  [address], [place] [address] . 10 am to 8 pm Monday-Friday . 12 pm to 8 pm Saturday-Sunday   . [facility] at [place]                  Get Driving Directions  [address], [place], [place] [address] . 8 am to 8 pm Monday-Friday . 9 am to 6 pm Saturday . 11 am to 6 pm Sunday   . [facility] at [place]                  Get Driving Directions   [address] [place], [place] [address] . 8 am to 8 pm Monday-Friday . 8 am to 4 pm Saturday-Sunday    . [facility] at [place]                    Get Driving Directions  [phone]  [address] [place], [place] [address]  . Monday-Friday, 12 PM to 6 PM    Your e-visit answers were reviewed by a board certified  advanced clinical practitioner to complete your personal care plan.  Thank you for using e-Visits. I spent 5-10 minutes on review and completion of this note- [name] [facility]

## 2019-06-19 ENCOUNTER — Other Ambulatory Visit: Payer: 59

## 2019-06-19 DIAGNOSIS — K7460 Unspecified cirrhosis of liver: Secondary | ICD-10-CM

## 2019-06-20 ENCOUNTER — Other Ambulatory Visit: Payer: Self-pay

## 2019-06-20 LAB — HEPATITIS B SURFACE ANTIGEN: Hepatitis B Surface Ag: NONREACTIVE

## 2019-06-20 LAB — HEPATITIS A ANTIBODY, TOTAL: Hepatitis A AB,Total: REACTIVE — AB

## 2019-06-20 LAB — ALPHA-1-ANTITRYPSIN: A-1 Antitrypsin, Ser: 140 mg/dL (ref 83–199)

## 2019-06-20 LAB — IGG: IgG (Immunoglobin G), Serum: 1889 mg/dL — ABNORMAL HIGH (ref 600–1640)

## 2019-06-20 LAB — HEPATITIS C ANTIBODY
Hepatitis C Ab: NONREACTIVE
SIGNAL TO CUT-OFF: 0.12 (ref <1.00)

## 2019-06-20 LAB — ANTI-SMOOTH MUSCLE ANTIBODY, IGG: Actin (Smooth Muscle) Antibody (IGG): <20 U (ref <20)

## 2019-06-20 LAB — AFP TUMOR MARKER: AFP-Tumor Marker: 7 ng/mL — ABNORMAL HIGH

## 2019-06-20 LAB — ANA: Anti Nuclear Antibody (ANA): NEGATIVE

## 2019-06-20 LAB — HEPATITIS B SURFACE ANTIBODY,QUALITATIVE: Hep B S Ab: NONREACTIVE

## 2019-06-25 ENCOUNTER — Ambulatory Visit
Admission: RE | Admit: 2019-06-25 | Discharge: 2019-06-25 | Disposition: A | Discharge status: Home / Self Care | Payer: 59 | Source: Ambulatory Visit | Attending: Gastroenterology | Admitting: Gastroenterology

## 2019-06-25 DIAGNOSIS — Z1211 Encounter for screening for malignant neoplasm of colon: Secondary | ICD-10-CM

## 2019-06-25 DIAGNOSIS — K7460 Unspecified cirrhosis of liver: Secondary | ICD-10-CM

## 2019-06-25 DIAGNOSIS — R188 Other ascites: Secondary | ICD-10-CM

## 2019-06-25 DIAGNOSIS — I8500 Esophageal varices without bleeding: Secondary | ICD-10-CM

## 2019-06-29 ENCOUNTER — Other Ambulatory Visit: Payer: Self-pay

## 2019-06-29 ENCOUNTER — Emergency Department (HOSPITAL_COMMUNITY): Payer: 59

## 2019-06-29 ENCOUNTER — Inpatient Hospital Stay (HOSPITAL_COMMUNITY)
Admission: EM | Admit: 2019-06-29 | Discharge: 2019-07-02 | DRG: 638 | Disposition: A | Discharge status: Home / Self Care | Payer: 59 | Attending: Internal Medicine | Admitting: Internal Medicine

## 2019-06-29 ENCOUNTER — Encounter (HOSPITAL_COMMUNITY): Payer: Self-pay | Admitting: Emergency Medicine

## 2019-06-29 DIAGNOSIS — R197 Diarrhea, unspecified: Secondary | ICD-10-CM | POA: Diagnosis present

## 2019-06-29 DIAGNOSIS — K219 Gastro-esophageal reflux disease without esophagitis: Secondary | ICD-10-CM | POA: Diagnosis present

## 2019-06-29 DIAGNOSIS — Z91048 Other nonmedicinal substance allergy status: Secondary | ICD-10-CM | POA: Diagnosis not present

## 2019-06-29 DIAGNOSIS — K7469 Other cirrhosis of liver: Secondary | ICD-10-CM | POA: Diagnosis not present

## 2019-06-29 DIAGNOSIS — K7460 Unspecified cirrhosis of liver: Secondary | ICD-10-CM | POA: Diagnosis present

## 2019-06-29 DIAGNOSIS — E871 Hypo-osmolality and hyponatremia: Secondary | ICD-10-CM | POA: Diagnosis present

## 2019-06-29 DIAGNOSIS — Z8249 Family history of ischemic heart disease and other diseases of the circulatory system: Secondary | ICD-10-CM

## 2019-06-29 DIAGNOSIS — E1100 Type 2 diabetes mellitus with hyperosmolarity without nonketotic hyperglycemic-hyperosmolar coma (NKHHC): Principal | ICD-10-CM

## 2019-06-29 DIAGNOSIS — R739 Hyperglycemia, unspecified: Secondary | ICD-10-CM

## 2019-06-29 DIAGNOSIS — J329 Chronic sinusitis, unspecified: Secondary | ICD-10-CM | POA: Diagnosis present

## 2019-06-29 DIAGNOSIS — I129 Hypertensive chronic kidney disease with stage 1 through stage 4 chronic kidney disease, or unspecified chronic kidney disease: Secondary | ICD-10-CM | POA: Diagnosis present

## 2019-06-29 DIAGNOSIS — N183 Chronic kidney disease, stage 3 (moderate): Secondary | ICD-10-CM | POA: Diagnosis present

## 2019-06-29 DIAGNOSIS — Z20828 Contact with and (suspected) exposure to other viral communicable diseases: Secondary | ICD-10-CM | POA: Diagnosis present

## 2019-06-29 DIAGNOSIS — R112 Nausea with vomiting, unspecified: Secondary | ICD-10-CM

## 2019-06-29 DIAGNOSIS — Z885 Allergy status to narcotic agent status: Secondary | ICD-10-CM

## 2019-06-29 DIAGNOSIS — Z79899 Other long term (current) drug therapy: Secondary | ICD-10-CM

## 2019-06-29 DIAGNOSIS — E876 Hypokalemia: Secondary | ICD-10-CM | POA: Diagnosis not present

## 2019-06-29 DIAGNOSIS — N179 Acute kidney failure, unspecified: Secondary | ICD-10-CM

## 2019-06-29 DIAGNOSIS — E785 Hyperlipidemia, unspecified: Secondary | ICD-10-CM | POA: Diagnosis present

## 2019-06-29 DIAGNOSIS — Z841 Family history of disorders of kidney and ureter: Secondary | ICD-10-CM

## 2019-06-29 DIAGNOSIS — E860 Dehydration: Secondary | ICD-10-CM | POA: Diagnosis present

## 2019-06-29 DIAGNOSIS — Z833 Family history of diabetes mellitus: Secondary | ICD-10-CM | POA: Diagnosis not present

## 2019-06-29 DIAGNOSIS — Z880 Allergy status to penicillin: Secondary | ICD-10-CM

## 2019-06-29 DIAGNOSIS — Z882 Allergy status to sulfonamides status: Secondary | ICD-10-CM | POA: Diagnosis not present

## 2019-06-29 LAB — COMPREHENSIVE METABOLIC PANEL
ALT: 36 U/L (ref 0–44)
AST: 53 U/L — ABNORMAL HIGH (ref 15–41)
Albumin: 3.7 g/dL (ref 3.5–5.0)
Alkaline Phosphatase: 173 U/L — ABNORMAL HIGH (ref 38–126)
Anion gap: 17 — ABNORMAL HIGH (ref 5–15)
BUN: 57 mg/dL — ABNORMAL HIGH (ref 6–20)
CO2: 25 mmol/L (ref 22–32)
Calcium: 10.6 mg/dL — ABNORMAL HIGH (ref 8.9–10.3)
Chloride: 79 mmol/L — ABNORMAL LOW (ref 98–111)
Creatinine, Ser: 2.98 mg/dL — ABNORMAL HIGH (ref 0.44–1.00)
GFR calc Af Amer: 19 mL/min — ABNORMAL LOW (ref >60)
GFR calc non Af Amer: 16 mL/min — ABNORMAL LOW (ref >60)
Glucose, Bld: 857 mg/dL (ref 70–99)
Potassium: 4.5 mmol/L (ref 3.5–5.1)
Sodium: 121 mmol/L — ABNORMAL LOW (ref 135–145)
Total Bilirubin: 2.7 mg/dL — ABNORMAL HIGH (ref 0.3–1.2)
Total Protein: 8.7 g/dL — ABNORMAL HIGH (ref 6.5–8.1)

## 2019-06-29 LAB — URINALYSIS, ROUTINE W REFLEX MICROSCOPIC
Bilirubin Urine: NEGATIVE
Glucose, UA: >=500 mg/dL — AB
Ketones, ur: NEGATIVE mg/dL
Nitrite: NEGATIVE
Protein, ur: NEGATIVE mg/dL
Specific Gravity, Urine: 1.023 (ref 1.005–1.030)
pH: 5 (ref 5.0–8.0)

## 2019-06-29 LAB — POCT I-STAT EG7
Acid-Base Excess: 4 mmol/L — ABNORMAL HIGH (ref 0.0–2.0)
Bicarbonate: 28.9 mmol/L — ABNORMAL HIGH (ref 20.0–28.0)
Calcium, Ion: 1.03 mmol/L — ABNORMAL LOW (ref 1.15–1.40)
HCT: 56 % — ABNORMAL HIGH (ref 36.0–46.0)
Hemoglobin: 19 g/dL — ABNORMAL HIGH (ref 12.0–15.0)
O2 Saturation: 48 %
Potassium: 5 mmol/L (ref 3.5–5.1)
Sodium: 120 mmol/L — ABNORMAL LOW (ref 135–145)
TCO2: 30 mmol/L (ref 22–32)
pCO2, Ven: 43.9 mmHg — ABNORMAL LOW (ref 44.0–60.0)
pH, Ven: 7.426 (ref 7.250–7.430)
pO2, Ven: 26 mmHg — CL (ref 32.0–45.0)

## 2019-06-29 LAB — CBC
HCT: 51.3 % — ABNORMAL HIGH (ref 36.0–46.0)
Hemoglobin: 18.3 g/dL — ABNORMAL HIGH (ref 12.0–15.0)
MCH: 31 pg (ref 26.0–34.0)
MCHC: 35.7 g/dL (ref 30.0–36.0)
MCV: 86.8 fL (ref 80.0–100.0)
Platelets: 250 10*3/uL (ref 150–400)
RBC: 5.91 MIL/uL — ABNORMAL HIGH (ref 3.87–5.11)
RDW: 12.9 % (ref 11.5–15.5)
WBC: 8.8 10*3/uL (ref 4.0–10.5)
nRBC: 0 % (ref 0.0–0.2)

## 2019-06-29 LAB — GLUCOSE, CAPILLARY: Glucose-Capillary: 502 mg/dL (ref 70–99)

## 2019-06-29 LAB — CBG MONITORING, ED
Glucose-Capillary: >600 mg/dL (ref 70–99)
Glucose-Capillary: >600 mg/dL (ref 70–99)
Glucose-Capillary: >600 mg/dL (ref 70–99)
Glucose-Capillary: >600 mg/dL (ref 70–99)

## 2019-06-29 LAB — LIPASE, BLOOD: Lipase: 443 U/L — ABNORMAL HIGH (ref 11–51)

## 2019-06-29 LAB — SARS CORONAVIRUS 2 BY RT PCR (HOSPITAL ORDER, PERFORMED IN ~~LOC~~ HOSPITAL LAB): SARS Coronavirus 2: NEGATIVE

## 2019-06-29 MED ORDER — FENTANYL CITRATE (PF) 100 MCG/2ML IJ SOLN: 25.0000 ug | Freq: Four times a day (QID) | INTRAMUSCULAR | Status: DC | PRN

## 2019-06-29 MED ORDER — NADOLOL 20 MG PO TABS
20.0000 mg | ORAL_TABLET | Freq: Every day | ORAL | Status: DC
  Administered 2019-06-30 – 2019-07-01 (×2): 20 mg via ORAL
  Filled 2019-06-29 (×3): qty 1

## 2019-06-29 MED ORDER — POTASSIUM CHLORIDE 10 MEQ/100ML IV SOLN
10.0000 meq | INTRAVENOUS | Status: AC
  Administered 2019-06-29 (×2): 10 meq via INTRAVENOUS
  Filled 2019-06-29 (×2): qty 100

## 2019-06-29 MED ORDER — FLUTICASONE PROPIONATE 50 MCG/ACT NA SUSP
2.0000 | Freq: Every day | NASAL | Status: DC
  Administered 2019-06-29 – 2019-06-30 (×2): 2 via NASAL
  Filled 2019-06-29: qty 16

## 2019-06-29 MED ORDER — SODIUM CHLORIDE 0.9 % IV BOLUS
1000.0000 mL | Freq: Once | INTRAVENOUS | Status: AC
  Administered 2019-06-29: 18:00:00 1000 mL via INTRAVENOUS

## 2019-06-29 MED ORDER — DEXTROSE-NACL 5-0.45 % IV SOLN: INTRAVENOUS | Status: DC

## 2019-06-29 MED ORDER — ONDANSETRON HCL 4 MG/2ML IJ SOLN
4.0000 mg | Freq: Once | INTRAMUSCULAR | Status: AC
  Administered 2019-06-29: 18:00:00 4 mg via INTRAVENOUS
  Filled 2019-06-29: qty 2

## 2019-06-29 MED ORDER — SODIUM CHLORIDE 0.9% FLUSH: 3.0000 mL | Freq: Once | INTRAVENOUS | Status: DC

## 2019-06-29 MED ORDER — AZELASTINE HCL 0.1 % NA SOLN: 2.0000 | Freq: Two times a day (BID) | NASAL | Status: DC | PRN

## 2019-06-29 MED ORDER — PANTOPRAZOLE SODIUM 40 MG IV SOLR
40.0000 mg | INTRAVENOUS | Status: DC
  Administered 2019-06-30 – 2019-07-01 (×2): 40 mg via INTRAVENOUS
  Filled 2019-06-29 (×2): qty 40

## 2019-06-29 MED ORDER — ONDANSETRON HCL 4 MG PO TABS: 4.0000 mg | ORAL_TABLET | Freq: Four times a day (QID) | ORAL | Status: DC | PRN

## 2019-06-29 MED ORDER — INSULIN REGULAR(HUMAN) IN NACL 100-0.9 UT/100ML-% IV SOLN
INTRAVENOUS | Status: DC
  Administered 2019-06-29: 5.4 [IU]/h via INTRAVENOUS
  Filled 2019-06-29 (×3): qty 100

## 2019-06-29 MED ORDER — ONDANSETRON HCL 4 MG/2ML IJ SOLN: 4.0000 mg | Freq: Four times a day (QID) | INTRAMUSCULAR | Status: DC | PRN

## 2019-06-29 NOTE — ED Triage Notes (Signed)
Pt here for eval of nausea and vomiting for 1 week. States she is taking doxycyline currently. Pt endorses feeling weak and dehydrated, not able to eat. No diarrhea. States she has no hx of diabetes but CBG >600.

## 2019-06-29 NOTE — ED Notes (Signed)
ED TO INPATIENT HANDOFF REPORT  ED Nurse Name and Phone #: Japhet Morgenthaler [phone]  S Name/Age/Gender Melissa [name] 58 y.o. female Room/Bed: 027C/027C  Code Status   Code Status: Full Code  Home/SNF/Other Home Patient oriented to: self, place, time and situation Is this baseline? Yes   Triage Complete: Triage complete  Chief Complaint hyperglycemic  Triage Note Pt here for eval of nausea and vomiting for 1 week. States she is taking doxycyline currently. Pt endorses feeling weak and dehydrated, not able to eat. No diarrhea. States she has no hx of diabetes but CBG >600.    Allergies Allergies  Allergen Reactions  . Codeine Nausea And Vomiting  . Erythromycin Nausea And Vomiting  . Ketek [Telithromycin] Nausea And Vomiting  . Oxycodone-Acetaminophen Nausea And Vomiting  . Penicillins Nausea And Vomiting  . Propoxyphene N-Acetaminophen Nausea And Vomiting  . Sulfonamide Derivatives Nausea And Vomiting  . Vicodin [Hydrocodone-Acetaminophen] Nausea And Vomiting    Level of Care/Admitting Diagnosis ED Disposition    ED Disposition Condition Comment   Admit  [facility] [[id number]]  Level of Care: Progressive [102]  Covid Evaluation: Asymptomatic Screening Protocol (No Symptoms)  Diagnosis: Diabetic hyperosmolar non-ketotic state [facility](HCC) [[id number]][[id number]]  Admitting Physician: Alessandra BevelsKAMINENI, NEELIMA [[id number]]  Attending Physician: Alessandra BevelsKAMINENI, NEELIMA [[id number]]  Estimated length of stay: past midnight tomorrow  Certification:: I certify this patient will need inpatient services for at least 2 midnights  PT Class (Do Not Modify): Inpatient [101]  PT Acc Code (Do Not Modify): Private [1]       B Medical/Surgery History Past Medical History:  Diagnosis Date  . Acute renal failure (HCC) 06/11/2015  . Allergy    sinus allergies  . Chronic kidney disease (CKD), stage III (moderate) (HCC)    [name]/notes 06/11/2015  . Diabetes mellitus without complication (HCC)  dx'd 06/11/2015   [name]/notes 06/11/2015  . Esophageal varices (HCC)   . GERD (gastroesophageal reflux disease)   . Heart murmur   . Hypertension    Past Surgical History:  Procedure Laterality Date  . ABDOMINAL HYSTERECTOMY  1997   "partial"  . CARPAL TUNNEL RELEASE Right 1987  . CESAREAN SECTION  1979  . FOOT SURGERY Bilateral 2018  . REDUCTION MAMMAPLASTY Bilateral 2005  . TUBAL LIGATION  1981     A IV Location/Drains/Wounds Patient Lines/Drains/Airways Status   Active Line/Drains/Airways    Name:   Placement date:   Placement time:   Site:   Days:   Peripheral IV 06/29/19 Left Antecubital   06/29/19    1734    Antecubital   less than 1   Peripheral IV 06/29/19 Right Hand   06/29/19    1735    Hand   less than 1          Intake/Output Last 24 hours No intake or output data in the 24 hours ending 06/29/19 1930  Labs/Imaging Results for orders placed or performed during the hospital encounter of 06/29/19 (from the past 48 hour(s))  CBG monitoring, ED     Status: Abnormal   Collection Time: 06/29/19  3:08 PM  Result Value Ref Range   Glucose-Capillary >600 (HH) 70 - 99 mg/dL  Lipase, blood     Status: Abnormal   Collection Time: 06/29/19  3:21 PM  Result Value Ref Range   Lipase 443 (H) 11 - 51 U/L    Comment: RESULTS CONFIRMED BY MANUAL DILUTION Performed at [facility], [address] [place], [place] [address]  Comprehensive metabolic panel     Status: Abnormal   Collection Time: 06/29/19  3:21 PM  Result Value Ref Range   Sodium 121 (L) 135 - 145 mmol/L   Potassium 4.5 3.5 - 5.1 mmol/L   Chloride 79 (L) 98 - 111 mmol/L   CO2 25 22 - 32 mmol/L   Glucose, Bld 857 (HH) 70 - 99 mg/dL    Comment: CRITICAL RESULT CALLED TO, READ BACK BY AND VERIFIED WITH: R Deshawn Witty RN AT 1611 ON [id number] BY K FORSYTH    BUN 57 (H) 6 - 20 mg/dL   Creatinine, Ser 5.282.98 (H) 0.44 - 1.00 mg/dL   Calcium 41.310.6 (H) 8.9 - 10.3 mg/dL   Total Protein 8.7 (H) 6.5 - 8.1 g/dL   Albumin  3.7 3.5 - 5.0 g/dL   AST 53 (H) 15 - 41 U/L   ALT 36 0 - 44 U/L   Alkaline Phosphatase 173 (H) 38 - 126 U/L   Total Bilirubin 2.7 (H) 0.3 - 1.2 mg/dL   GFR calc non Af Amer 16 (L) >60 mL/min   GFR calc Af Amer 19 (L) >60 mL/min   Anion gap 17 (H) 5 - 15    Comment: Performed at [facility], [address] [place], [place] [address]  CBC     Status: Abnormal   Collection Time: 06/29/19  3:21 PM  Result Value Ref Range   WBC 8.8 4.0 - 10.5 K/uL   RBC 5.91 (H) 3.87 - 5.11 MIL/uL   Hemoglobin 18.3 (H) 12.0 - 15.0 g/dL   HCT 02.751.3 (H) 25.336.0 - 66.446.0 %   MCV 86.8 80.0 - 100.0 fL   MCH 31.0 26.0 - 34.0 pg   MCHC 35.7 30.0 - 36.0 g/dL   RDW 40.312.9 47.411.5 - 25.915.5 %   Platelets 250 150 - 400 K/uL   nRBC 0.0 0.0 - 0.2 %    Comment: Performed at [facility], [address] [place], [place] [address]  POCT I-Stat EG7     Status: Abnormal   Collection Time: 06/29/19  4:46 PM  Result Value Ref Range   pH, Ven 7.426 7.250 - 7.430   pCO2, Ven 43.9 (L) 44.0 - 60.0 mmHg   pO2, Ven 26.0 (LL) 32.0 - 45.0 mmHg   Bicarbonate 28.9 (H) 20.0 - 28.0 mmol/L   TCO2 30 22 - 32 mmol/L   O2 Saturation 48.0 %   Acid-Base Excess 4.0 (H) 0.0 - 2.0 mmol/L   Sodium 120 (L) 135 - 145 mmol/L   Potassium 5.0 3.5 - 5.1 mmol/L   Calcium, Ion 1.03 (L) 1.15 - 1.40 mmol/L   HCT 56.0 (H) 36.0 - 46.0 %   Hemoglobin 19.0 (H) 12.0 - 15.0 g/dL   Patient temperature HIDE    Sample type VENOUS    Comment NOTIFIED PHYSICIAN   CBG monitoring, ED     Status: Abnormal   Collection Time: 06/29/19  5:26 PM  Result Value Ref Range   Glucose-Capillary >600 (HH) 70 - 99 mg/dL  CBG monitoring, ED     Status: Abnormal   Collection Time: 06/29/19  6:56 PM  Result Value Ref Range   Glucose-Capillary >600 (HH) 70 - 99 mg/dL   Ct Abdomen Pelvis Wo Contrast  Result Date: 06/29/2019 CLINICAL DATA:  Nausea and vomiting for 1 week. EXAM: CT ABDOMEN AND PELVIS WITHOUT CONTRAST TECHNIQUE: Multidetector CT imaging of the abdomen  and pelvis was performed following the standard protocol without IV contrast.  COMPARISON:  June 25, 2019 FINDINGS: Lower chest: No acute abnormality. Hepatobiliary: Nodular contour of liver is identified. Mild heterogeneous density of the liver is identified. The gallbladder is normal. The biliary tree is normal. Pancreas: Unremarkable. No pancreatic ductal dilatation or surrounding inflammatory changes. Spleen: Normal in size without focal abnormality. Adrenals/Urinary Tract: Adrenal glands are unremarkable. Kidneys are normal, without renal calculi, focal lesion, or hydronephrosis. Bladder is unremarkable. Stomach/Bowel: Stomach is within normal limits. Appendix appears normal. No evidence of bowel wall thickening, distention, or inflammatory changes. Vascular/Lymphatic: Aortic atherosclerosis. No enlarged abdominal or pelvic lymph nodes. Reproductive: Status post hysterectomy. No adnexal masses. Other: None Musculoskeletal: No acute abnormality is noted. IMPRESSION: No acute abnormality identified in the abdomen and pelvis. No bowel obstruction or free air. Cirrhosis of liver. Parenchymal heterogeneity is identified unchanged compared prior CT. If further evaluation is desired, MR abdomen with and without contrast is recommended. Electronically Signed   By: Abelardo Diesel M.D.   On: 06/29/2019 17:12    Pending Labs Unresulted Labs (From admission, onward)    Start     Ordered   06/30/19 7829  Basic metabolic panel  Tomorrow morning,   R     06/29/19 1755   06/30/19 0500  CBC  Tomorrow morning,   R     06/29/19 1755   06/29/19 1840  SARS Coronavirus 2 [facility] order, Performed in [facility] hospital lab)  Once,   R     06/29/19 1840   06/29/19 1754  Hepatitis A antibody, IgM  Once,   STAT     06/29/19 1755   06/29/19 1749  HIV antibody (Routine Testing)  Once,   STAT     06/29/19 1755   06/29/19 1519  Urinalysis, Routine w reflex microscopic  ONCE - STAT,   STAT     06/29/19 1518           Vitals/Pain Today's Vitals   06/29/19 1518 06/29/19 1520 06/29/19 1748 06/29/19 1815  BP:  106/74 100/82 (!) 170/123  Pulse:  63 61 65  Resp:  16 15 (!) 22  Temp:  98.2 F (36.8 C)    TempSrc:  Oral    SpO2:  100% 97% 97%  PainSc: 6        Isolation Precautions Contact precautions  Medications Medications  sodium chloride flush (NS) 0.9 % injection 3 mL (3 mLs Intravenous Not Given 06/29/19 1607)  insulin regular, human (MYXREDLIN) 100 units/ 100 mL infusion (10.8 Units/hr Intravenous Rate/Dose Change 06/29/19 1857)  potassium chloride 10 mEq in 100 mL IVPB (10 mEq Intravenous New Bag/Given 06/29/19 1744)  dextrose 5 %-0.45 % sodium chloride infusion ( Intravenous Hold 06/29/19 1745)  nadolol (CORGARD) tablet 20 mg (has no administration in time range)  pantoprazole (PROTONIX) injection 40 mg (has no administration in time range)  azelastine (ASTELIN) 0.1 % nasal spray 2 spray (has no administration in time range)  fentaNYL (SUBLIMAZE) injection 25 mcg (has no administration in time range)  ondansetron (ZOFRAN) tablet 4 mg (has no administration in time range)    Or  ondansetron (ZOFRAN) injection 4 mg (has no administration in time range)  ondansetron (ZOFRAN) injection 4 mg (4 mg Intravenous Given 06/29/19 1741)  sodium chloride 0.9 % bolus 1,000 mL (1,000 mLs Intravenous New Bag/Given 06/29/19 1744)    Mobility walks High fall risk      R Recommendations: See Admitting Provider Note  Report given to:   Additional Notes:

## 2019-06-29 NOTE — H&P (Signed)
History and Physical    DOA: 06/29/2019  PCP: [facility]  Patient coming from: Home  Chief Complaint: Intractable nausea vomiting x1 week  HPI: Melissa [name] is a 58 y.o. female with history h/o recurrent sinusitis (since childhood), chronic kidney disease stage II-III (baseline creatinine around 1.2-1.4), GERD, hypertension, liver cirrhosis (patient apparently has history of sarcoidosis involving liver, lungs and spleen per [facility]/last GI note by Dr. Adela LankArmbruster), esophageal varices for which she takes nadolol since 2012 presents with complaints of nausea vomiting and diarrhea since past weekend.  Patient states she was recently having postnasal drip/left ear fullness and headaches consistent with sinus flares for which she sought medical attention through her PCP about 2 weeks back.  She was prescribed doxycycline and she was at the end of antibiotic course when she was scheduled for colonoscopy and had to undergo bowel prep last weekend.  She was scheduled for screening colonoscopy on Monday (7/27) and started bowel prep on Friday (7/24).  She had loose bowel movements with bowel prep as expected but also developed severe nausea and vomiting, could not keep anything down.  She states she was able to undergo colonoscopy as planned by GI with no significant findings but her GI symptoms persisted after coming home.  She has not been able to tolerate diet since past Monday.  She denies any hematemesis or melena.  Last EGD in 2016 was within normal limits with no varices. Of note, she started following [facility], Dr. Adela LankArmbruster, recently and underwent liver biopsy for liver cirrhosis/sarcoidosis work-up with no complications. Work-up in the ED today revealed significant hyperglycemia with blood glucose at 857, bicarb 25, anion gap 17, pseudohyponatremia with sodium at 121, potassium 4.5, BUN 57, creatinine 2.98, lipase 443, ALP 173, normal AST/ALT, total  bili 2.7, hemoglobin 18, WBC 8.8, platelets 250.  CT abdomen negative for obstruction or any acute signs of infection.  Patient initiated on insulin drip and hospitalist service requested to admit for hyperglycemia/AKI   Review of Systems: As per HPI otherwise 10 point review of systems negative.    Past Medical History:  Diagnosis Date   Acute renal failure (HCC) 06/11/2015   Allergy    sinus allergies   Chronic kidney disease (CKD), stage III (moderate) (HCC)    [name]/notes 06/11/2015   Diabetes mellitus without complication (HCC) dx'd 06/11/2015   [name]/notes 06/11/2015   Esophageal varices (HCC)    GERD (gastroesophageal reflux disease)    Heart murmur    Hypertension     Past Surgical History:  Procedure Laterality Date   ABDOMINAL HYSTERECTOMY  1997   "partial"   CARPAL TUNNEL RELEASE Right 1987   CESAREAN SECTION  1979   FOOT SURGERY Bilateral 2018   REDUCTION MAMMAPLASTY Bilateral 2005   TUBAL LIGATION  1981    Social history:  reports that she has never smoked. She has never used smokeless tobacco. She reports that she does not drink alcohol or use drugs.   Allergies  Allergen Reactions   Codeine Nausea And Vomiting   Erythromycin Nausea And Vomiting   Ketek [Telithromycin] Nausea And Vomiting   Oxycodone-Acetaminophen Nausea And Vomiting   Penicillins Nausea And Vomiting   Propoxyphene N-Acetaminophen Nausea And Vomiting   Sulfonamide Derivatives Nausea And Vomiting   Vicodin [Hydrocodone-Acetaminophen] Nausea And Vomiting    Family History  Problem Relation Age of Onset   Hypertension Mother    Hypertension Brother    Kidney disease Brother  ESRD on Dialysis   Diabetes Brother    CAD Sister    Colon cancer Neg Hx    Esophageal cancer Neg Hx    Rectal cancer Neg Hx    Stomach cancer Neg Hx       Prior to Admission medications   Medication Sig Start Date End Date Taking? Authorizing Provider  azelastine (ASTELIN) 0.1 %  nasal spray Place 2 sprays into both nostrils 2 (two) times daily. Use in each nostril as directed Patient taking differently: Place 2 sprays into both nostrils 2 (two) times daily as needed. Use in each nostril as directed 09/27/17   Porfirio OarJeffery, Chelle, PA  cyclobenzaprine (FLEXERIL) 10 MG tablet Take 1 tablet (10 mg total) by mouth 3 (three) times daily as needed for muscle spasms. 09/27/17   Porfirio OarJeffery, Chelle, PA  doxycycline (ADOXA) 100 MG tablet Take 100 mg by mouth 2 (two) times daily. FOR 10 DAYS 06/20/19   [provider]  esomeprazole (NEXIUM) 40 MG capsule Take 1 capsule (40 mg total) by mouth 1 day or 1 dose. 09/27/17   Porfirio OarJeffery, Chelle, PA  guaiFENesin (MUCINEX) 600 MG 12 hr tablet Take by mouth 2 (two) times daily as needed.    [provider]  nadolol (CORGARD) 40 MG tablet TAKE 1 TABLET (40 MG TOTAL) BY MOUTH DAILY 09/27/17   Porfirio OarJeffery, Chelle, PA  triamterene-hydrochlorothiazide (MAXZIDE-25) 37.5-25 MG tablet Take 1 tablet by mouth daily. 09/27/17   Porfirio OarJeffery, Chelle, PA    Physical Exam: Vitals:   06/29/19 1520 06/29/19 1748 06/29/19 1815  BP: 106/74 100/82 (!) 170/123  Pulse: 63 61 65  Resp: 16 15 (!) 22  Temp: 98.2 F (36.8 C)    TempSrc: Oral    SpO2: 100% 97% 97%    Constitutional: NAD, calm, comfortable Eyes: PERRL, lids and conjunctivae normal ENMT: Mucous membranes are moist. Posterior pharynx clear of any exudate or lesions.Normal dentition.  + Postnasal drip Neck: normal, supple, no masses, no thyromegaly Respiratory: clear to auscultation bilaterally, no wheezing, no crackles. Normal respiratory effort. No accessory muscle use.  Cardiovascular: Regular rate and rhythm, no murmurs / rubs / gallops. No extremity edema. 2+ pedal pulses. No carotid bruits.  Abdomen: Mild epigastric tenderness, no masses palpated. No hepatosplenomegaly. Bowel sounds positive.  Musculoskeletal: no clubbing / cyanosis. No joint deformity upper and lower extremities. Good ROM,  no contractures. Normal muscle tone.  Neurologic: CN 2-12 grossly intact. Sensation intact, DTR normal. Strength 5/5 in all 4.  Psychiatric: Normal judgment and insight. Alert and oriented x 3. Normal mood.  SKIN/catheters: no rashes, lesions, ulcers. No induration  Labs on Admission: I have personally reviewed following labs and imaging studies  CBC: Recent Labs  Lab 06/29/19 1521 06/29/19 1646  WBC 8.8  --   HGB 18.3* 19.0*  HCT 51.3* 56.0*  MCV 86.8  --   PLT 250  --    Basic Metabolic Panel: Recent Labs  Lab 06/29/19 1521 06/29/19 1646  NA 121* 120*  K 4.5 5.0  CL 79*  --   CO2 25  --   GLUCOSE 857*  --   BUN 57*  --   CREATININE 2.98*  --   CALCIUM 10.6*  --    GFR: CrCl cannot be calculated (Unknown ideal weight.). Liver Function Tests: Recent Labs  Lab 06/29/19 1521  AST 53*  ALT 36  ALKPHOS 173*  BILITOT 2.7*  PROT 8.7*  ALBUMIN 3.7   Recent Labs  Lab 06/29/19 1521  LIPASE 443*  No results for input(s): AMMONIA in the last 168 hours. Coagulation Profile: No results for input(s): INR, PROTIME in the last 168 hours. Cardiac Enzymes: No results for input(s): CKTOTAL, CKMB, CKMBINDEX, TROPONINI in the last 168 hours. BNP (last 3 results) No results for input(s): PROBNP in the last 8760 hours. HbA1C: No results for input(s): HGBA1C in the last 72 hours. CBG: Recent Labs  Lab 06/29/19 1508 06/29/19 1726 06/29/19 1856  GLUCAP >600* >600* >600*   Lipid Profile: No results for input(s): CHOL, HDL, LDLCALC, TRIG, CHOLHDL, LDLDIRECT in the last 72 hours. Thyroid Function Tests: No results for input(s): TSH, T4TOTAL, FREET4, T3FREE, THYROIDAB in the last 72 hours. Anemia Panel: No results for input(s): VITAMINB12, FOLATE, FERRITIN, TIBC, IRON, RETICCTPCT in the last 72 hours. Urine analysis:    Component Value Date/Time   COLORURINE YELLOW 06/11/2015 1802   APPEARANCEUR HAZY (A) 06/11/2015 1802   LABSPEC 1.031 (H) 06/11/2015 1802   PHURINE  6.0 06/11/2015 1802   GLUCOSEU >1000 (A) 06/11/2015 1802   HGBUR LARGE (A) 06/11/2015 1802   BILIRUBINUR NEGATIVE 06/11/2015 1802   BILIRUBINUR neg 04/08/2015 1304   KETONESUR NEGATIVE 06/11/2015 1802   PROTEINUR 30 (A) 06/11/2015 1802   UROBILINOGEN 0.2 06/11/2015 1802   NITRITE NEGATIVE 06/11/2015 1802   LEUKOCYTESUR NEGATIVE 06/11/2015 1802    Radiological Exams on Admission: Personally reviewed  Ct Abdomen Pelvis Wo Contrast  Result Date: 06/29/2019 CLINICAL DATA:  Nausea and vomiting for 1 week. EXAM: CT ABDOMEN AND PELVIS WITHOUT CONTRAST TECHNIQUE: Multidetector CT imaging of the abdomen and pelvis was performed following the standard protocol without IV contrast. COMPARISON:  June 25, 2019 FINDINGS: Lower chest: No acute abnormality. Hepatobiliary: Nodular contour of liver is identified. Mild heterogeneous density of the liver is identified. The gallbladder is normal. The biliary tree is normal. Pancreas: Unremarkable. No pancreatic ductal dilatation or surrounding inflammatory changes. Spleen: Normal in size without focal abnormality. Adrenals/Urinary Tract: Adrenal glands are unremarkable. Kidneys are normal, without renal calculi, focal lesion, or hydronephrosis. Bladder is unremarkable. Stomach/Bowel: Stomach is within normal limits. Appendix appears normal. No evidence of bowel wall thickening, distention, or inflammatory changes. Vascular/Lymphatic: Aortic atherosclerosis. No enlarged abdominal or pelvic lymph nodes. Reproductive: Status post hysterectomy. No adnexal masses. Other: None Musculoskeletal: No acute abnormality is noted. IMPRESSION: No acute abnormality identified in the abdomen and pelvis. No bowel obstruction or free air. Cirrhosis of liver. Parenchymal heterogeneity is identified unchanged compared prior CT. If further evaluation is desired, MR abdomen with and without contrast is recommended. Electronically Signed   By: Sherian ReinWei-Chen  Lin M.D.   On: 06/29/2019 17:12     EKG: Pending     Assessment and Plan:   Active Problems:   Diabetic hyperosmolar non-ketotic state (HCC)    1.  Nonketotic hyperosmolar hyperglycemia: Patient apparently was diagnosed with diabetes in 2016 in the setting of prednisone therapy, initially required insulin, then transitioned to metformin and subsequently discontinued on oral hypoglycemic agents after close monitoring over 1 year revealing normal blood glucose levels.  Patient denies any recent steroid use.  Check hemoglobin A1c, continue insulin drip.  No evidence of acidosis.  Can transition to subcu Lantus insulin if blood glucose levels come down tonight or in a.m.  Will keep n.p.o. for now with IV fluids and concern for problem #2  2.  Intractable nausea/vomiting: Patient relates symptom onset around bowel prep time.  She has also recently been on doxycycline.  May have pill induced esophagitis/gastritis.  As part of liver cirrhosis  work-up it appears that patient underwent hepatitis screen with hepatitis A total antibody reactive.  Likely IgG but will check IgM as well.  She denies any recent travel or sick contacts or eating in restaurants.  Will keep on contact precautions for now.  COVID testing pending.  CT abdomen negative for any evidence of bowel obstruction.  Antiemetics/IV PPI/n.p.o. with IV fluids for now.  If symptoms improve will start diet slowly.  Will consider inpatient GI eval if symptoms persist.  3.  Liver cirrhosis:?  History of sarcoidosis.  Most recent EGD in 2016 did not show any varices.  Recently underwent liver biopsy according to patient.  Her LFTs do show elevated alcohol/total bili consistent with cirrhotic changes noted on CT abdomen.  Her AFP was borderline elevated and GI following closely with 2-month surveillance.  She is also been receiving hepatitis B vaccination series through GI clinic.  Elevated IgG noted at 1889.  Defer work-up for autoimmune hepatitis to GI  4.  Recurrent sinusitis:  Still has some residual postnasal drip.  Will avoid p.o. antibiotics for now.     Nasal spray and monitor symptoms.  If develops fever or white count, can consider IV antibiotics until able to tolerate p.o.  5.  AKI over chronic kidney disease stage III: In the setting of problem #2 and diuretic use at home.  IV hydration and hold diuretics/ACE inhibitors for now.  Labs in a.m.  6. Hyponatremia: In the setting of hyperglycemia/problem #1 and dehydration/problem #2.  Continue to monitor.  7.  Hypertension: Resume nadolol at lower dose as blood pressures appear to be soft.  Hold triamterene/HCTZ.  DVT prophylaxis: SCDs  COVID screen: Pending  Code Status: Full code.Health care proxy would be her husband  Patient/Family Communication: Discussed with patient and all questions answered to satisfaction.  Consults called: None Admission status :I certify that at the point of admission it is my clinical judgment that the patient will require inpatient hospital care spanning beyond 2 midnights from the point of admission due to high intensity of service and high frequency of surveillance required.Inpatient status is judged to be reasonable and necessary in order to provide the required intensity of service to ensure the patient's safety. The patient's presenting symptoms, physical exam findings, and initial radiographic and laboratory data in the context of their chronic comorbidities is felt to place them at high risk for further clinical deterioration. The following factors support the patient status of inpatient : Hyperosmolar nonketotic hyperglycemia requiring IV insulin drip and AKI requiring IV fluids Expected LOS: 2 to 3 days    Guilford Shi MD [facility] Pager [phone]  If 7PM-7AM, please contact night-coverage [email or web address] Password [email or web address]  06/29/2019, 7:00 PM

## 2019-06-29 NOTE — ED Notes (Signed)
Attempted to call report x 1  

## 2019-06-29 NOTE — ED Provider Notes (Signed)
[facility] EMERGENCY DEPARTMENT Provider Note   CSN: [id number] Arrival date & time: 06/29/19  1500    History   Chief Complaint Chief Complaint  Patient presents with  . Emesis  . Abdominal Pain    HPI Melissa [name] is a 58 y.o. 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  She reports that had not been feeling well over the last 2 weeks but symptoms worsened about 5 days ago when she started doing bowel prep.  She states that then, she has had persistent nausea/vomiting and has not been able to keep any p.o. down.  She denies any bloody emesis.  She has not had any fevers or abdominal pain.  She has had some increased urination but denies any dysuria or hematuria.  She denies any diarrhea.  She reports a history of diabetes in 2016 that was thought to be due to steroid use.  She has not been on any metformin or insulin recently.  She denies any recent use of steroids. She denies any fevers, CP, SOB.       The history is provided by the patient.    Past Medical History:  Diagnosis Date  . Acute renal failure ([place]) 06/11/2015  . Allergy    sinus allergies  . Chronic kidney disease (CKD), stage III (moderate) ([place])    Archie Endo 06/11/2015  . Diabetes mellitus without complication ([place]) dx'd 2/70/6237   Archie Endo 06/11/2015  . Esophageal varices ([place])   . GERD (gastroesophageal reflux disease)   . Heart murmur   . Hypertension     Patient Active Problem List   Diagnosis Date Noted  . Diabetic hyperosmolar non-ketotic state ([place]) 06/29/2019  . Low back pain 09/27/2017  . Hyperlipidemia 11/11/2015  . History of esophageal varices 12/13/2014  . HTN (hypertension) 05/07/2012  . GERD (gastroesophageal reflux disease) 05/07/2012  . AR (allergic rhinitis) 05/07/2012  . BMI 40.0-44.9, adult ([place])  05/07/2012  . Hepatic cirrhosis ([place]) 11/25/2009  . SARCOIDOSIS 11/10/2009  . CARDIAC MURMUR 11/10/2009    Past Surgical History:  Procedure Laterality Date  . ABDOMINAL HYSTERECTOMY  1997   "partial"  . CARPAL TUNNEL RELEASE Right 1987  . CESAREAN SECTION  1979  . FOOT SURGERY Bilateral 2018  . REDUCTION MAMMAPLASTY Bilateral 2005  . TUBAL LIGATION  1981     OB History   No obstetric history on file.      Home Medications    Prior to Admission medications   Medication Sig Start Date End Date Taking? Authorizing Provider  azelastine (ASTELIN) 0.1 % nasal spray Place 2 sprays into both nostrils 2 (two) times daily. Use in each nostril as directed Patient taking differently: Place 2 sprays into both nostrils 2 (two) times daily as needed. Use in each nostril as directed 09/27/17   Harrison Mons, PA  cyclobenzaprine (FLEXERIL) 10 MG tablet Take 1 tablet (10 mg total) by mouth 3 (three) times daily as needed for muscle spasms. 09/27/17   Harrison Mons, PA  doxycycline (ADOXA) 100 MG tablet Take 100 mg by mouth 2 (two) times daily. FOR 10 DAYS 06/20/19   [provider]  esomeprazole (NEXIUM) 40 MG capsule Take 1 capsule (40 mg total) by mouth 1 day or 1 dose. 09/27/17   Harrison Mons, PA  guaiFENesin (MUCINEX) 600 MG 12 hr tablet Take by mouth 2 (two)  times daily as needed.    [provider]  nadolol (CORGARD) 40 MG tablet TAKE 1 TABLET (40 MG TOTAL) BY MOUTH DAILY 09/27/17   Porfirio OarJeffery, Chelle, PA  triamterene-hydrochlorothiazide (MAXZIDE-25) 37.5-25 MG tablet Take 1 tablet by mouth daily. 09/27/17   Porfirio OarJeffery, Chelle, PA    Family History Family History  Problem Relation Age of Onset  . Hypertension Mother   . Hypertension Brother   . Kidney disease Brother        ESRD on Dialysis  . Diabetes Brother   . CAD Sister   . Colon cancer Neg Hx   . Esophageal cancer Neg Hx   . Rectal cancer Neg Hx   . Stomach cancer Neg Hx     Social History Social  History   Tobacco Use  . Smoking status: Never Smoker  . Smokeless tobacco: Never Used  Substance Use Topics  . Alcohol use: No    Alcohol/week: 0.0 standard drinks  . Drug use: No     Allergies   Codeine, Erythromycin, Ketek [telithromycin], Oxycodone-acetaminophen, Penicillins, Propoxyphene n-acetaminophen, Sulfonamide derivatives, and Vicodin [hydrocodone-acetaminophen]   Review of Systems Review of Systems  Constitutional: Negative for fever.  Respiratory: Negative for cough and shortness of breath.   Cardiovascular: Negative for chest pain.  Gastrointestinal: Positive for abdominal pain, nausea and vomiting. Negative for diarrhea.  Genitourinary: Negative for dysuria and hematuria.  Neurological: Positive for weakness (generalized). Negative for headaches.  All other systems reviewed and are negative.    Physical Exam Updated Vital Signs BP 100/82   Pulse 61   Temp 98.2 F (36.8 C) (Oral)   Resp 15   SpO2 97%   Physical Exam Vitals signs and nursing note reviewed.  Constitutional:      Appearance: Normal appearance. She is well-developed.     Comments: Easily arouses to verbal stimuli  HENT:     Head: Normocephalic and atraumatic.  Eyes:     General: Lids are normal.     Conjunctiva/sclera: Conjunctivae normal.     Pupils: Pupils are equal, round, and reactive to light.  Neck:     Musculoskeletal: Full passive range of motion without pain.  Cardiovascular:     Rate and Rhythm: Normal rate and regular rhythm.     Pulses: Normal pulses.     Heart sounds: Normal heart sounds. No murmur. No friction rub. No gallop.   Pulmonary:     Effort: Pulmonary effort is normal.     Breath sounds: Normal breath sounds.  Abdominal:     Palpations: Abdomen is soft. Abdomen is not rigid.     Tenderness: There is no abdominal tenderness. There is no guarding.     Comments: Abdomen is soft, non-distended, non-tender. No rigidity, No guarding. No peritoneal signs.   Musculoskeletal: Normal range of motion.  Skin:    General: Skin is warm and dry.     Capillary Refill: Capillary refill takes less than 2 seconds.  Neurological:     Mental Status: She is alert and oriented to person, place, and time.  Psychiatric:        Speech: Speech normal.      ED Treatments / Results  Labs (all labs ordered are listed, but only abnormal results are displayed) Labs Reviewed  LIPASE, BLOOD - Abnormal; Notable for the following components:      Result Value   Lipase 443 (*)    All other components within normal limits  COMPREHENSIVE METABOLIC PANEL - Abnormal; Notable for the  following components:   Sodium 121 (*)    Chloride 79 (*)    Glucose, Bld 857 (*)    BUN 57 (*)    Creatinine, Ser 2.98 (*)    Calcium 10.6 (*)    Total Protein 8.7 (*)    AST 53 (*)    Alkaline Phosphatase 173 (*)    Total Bilirubin 2.7 (*)    GFR calc non Af Amer 16 (*)    GFR calc Af Amer 19 (*)    Anion gap 17 (*)    All other components within normal limits  CBC - Abnormal; Notable for the following components:   RBC 5.91 (*)    Hemoglobin 18.3 (*)    HCT 51.3 (*)    All other components within normal limits  CBG MONITORING, ED - Abnormal; Notable for the following components:   Glucose-Capillary >600 (*)    All other components within normal limits  POCT I-STAT EG7 - Abnormal; Notable for the following components:   pCO2, Ven 43.9 (*)    pO2, Ven 26.0 (*)    Bicarbonate 28.9 (*)    Acid-Base Excess 4.0 (*)    Sodium 120 (*)    Calcium, Ion 1.03 (*)    HCT 56.0 (*)    Hemoglobin 19.0 (*)    All other components within normal limits  CBG MONITORING, ED - Abnormal; Notable for the following components:   Glucose-Capillary >600 (*)    All other components within normal limits  SARS CORONAVIRUS 2  URINALYSIS, ROUTINE W REFLEX MICROSCOPIC  HIV ANTIBODY (ROUTINE TESTING W REFLEX)  BASIC METABOLIC PANEL  CBC  HEPATITIS A ANTIBODY, IGM    EKG None  Radiology  Ct Abdomen Pelvis Wo Contrast  Result Date: 06/29/2019 CLINICAL DATA:  Nausea and vomiting for 1 week. EXAM: CT ABDOMEN AND PELVIS WITHOUT CONTRAST TECHNIQUE: Multidetector CT imaging of the abdomen and pelvis was performed following the standard protocol without IV contrast. COMPARISON:  June 25, 2019 FINDINGS: Lower chest: No acute abnormality. Hepatobiliary: Nodular contour of liver is identified. Mild heterogeneous density of the liver is identified. The gallbladder is normal. The biliary tree is normal. Pancreas: Unremarkable. No pancreatic ductal dilatation or surrounding inflammatory changes. Spleen: Normal in size without focal abnormality. Adrenals/Urinary Tract: Adrenal glands are unremarkable. Kidneys are normal, without renal calculi, focal lesion, or hydronephrosis. Bladder is unremarkable. Stomach/Bowel: Stomach is within normal limits. Appendix appears normal. No evidence of bowel wall thickening, distention, or inflammatory changes. Vascular/Lymphatic: Aortic atherosclerosis. No enlarged abdominal or pelvic lymph nodes. Reproductive: Status post hysterectomy. No adnexal masses. Other: None Musculoskeletal: No acute abnormality is noted. IMPRESSION: No acute abnormality identified in the abdomen and pelvis. No bowel obstruction or free air. Cirrhosis of liver. Parenchymal heterogeneity is identified unchanged compared prior CT. If further evaluation is desired, MR abdomen with and without contrast is recommended. Electronically Signed   By: Sherian ReinWei-Chen  Lin M.D.   On: 06/29/2019 17:12    Procedures .Critical Care Performed by: Maxwell CaulLayden, Kaeden Depaz A, PA-C Authorized by: Maxwell CaulLayden, Soraya Paquette A, PA-C   Critical care provider statement:    Critical care time (minutes):  35   Critical care was necessary to treat or prevent imminent or life-threatening deterioration of the following conditions:  Metabolic crisis   Critical care was time spent personally by me on the following activities:  Discussions with  consultants, evaluation of patient's response to treatment, examination of patient, ordering and performing treatments and interventions, ordering and review of laboratory studies, ordering  and review of radiographic studies, pulse oximetry, re-evaluation of patient's condition, obtaining history from patient or surrogate and review of old charts   (including critical care time)  Medications Ordered in ED Medications  sodium chloride flush (NS) 0.9 % injection 3 mL (3 mLs Intravenous Not Given 06/29/19 1607)  insulin regular, human (MYXREDLIN) 100 units/ 100 mL infusion (5.4 Units/hr Intravenous New Bag/Given 06/29/19 1740)  potassium chloride 10 mEq in 100 mL IVPB (10 mEq Intravenous New Bag/Given 06/29/19 1744)  dextrose 5 %-0.45 % sodium chloride infusion ( Intravenous Hold 06/29/19 1745)  nadolol (CORGARD) tablet 20 mg (has no administration in time range)  pantoprazole (PROTONIX) injection 40 mg (has no administration in time range)  azelastine (ASTELIN) 0.1 % nasal spray 2 spray (has no administration in time range)  fentaNYL (SUBLIMAZE) injection 25 mcg (has no administration in time range)  ondansetron (ZOFRAN) tablet 4 mg (has no administration in time range)    Or  ondansetron (ZOFRAN) injection 4 mg (has no administration in time range)  ondansetron (ZOFRAN) injection 4 mg (4 mg Intravenous Given 06/29/19 1741)  sodium chloride 0.9 % bolus 1,000 mL (1,000 mLs Intravenous New Bag/Given 06/29/19 1744)     Initial Impression / Assessment and Plan / ED Course  I have reviewed the triage vital signs and the nursing notes.  Pertinent labs & imaging results that were available during my care of the patient were reviewed by me and considered in my medical decision making (see chart for details).        58 year old female who presents for evaluation of generalized weakness, intermittent abdominal pain, nausea/vomiting worse in the last 5 days.  No fevers, diarrhea.  History of diabetes in  2016 but was thought to be induced by steroids.  Had been on insulin and transition to metformin.  She was taken off metformin when sugars normalized.  Has not been on any medication since then.  No recent steroid use. Patient is afebrile, non-toxic appearing, sitting comfortably on examination table. Vital signs reviewed and stable.  On exam, she appears only weak but no focal weakness.  Abdomen is benign.  Plan for labs.  Concern for hyperglycemia versus DKA versus infectious process.  CMP shows Na 121, Glucose at 857, anion gap 17.  BUN and creatinine are elevated at 57/2.98.  Her baseline creatinine is between 1.2-1.4.  CBC  8.8, Hgb at 18.3.  Lipase is elevated at 443.  No recent for comparison.  Last one was about 4 years ago which was 41.  Given elevations in lipase plan for imaging for evaluation of acute pancreatitis.  Given elevated glucose and gap, will plan to put her on insulin drip.   CT abd/pelvis shows no acute abnormality. No evidence of pancreatitis.   Patient with AKI and hyperglycemia requiring insulin drip.  Will require admission.  Discussed with hospitalist.  Will admit.  Portions of this note were generated with Scientist, clinical (histocompatibility and immunogenetics)Dragon dictation software. Dictation errors may occur despite best attempts at proofreading.  Final Clinical Impressions(s) / ED Diagnoses   Final diagnoses:  Hyperglycemia  AKI (acute kidney injury) [facility](HCC)    ED Discharge Orders    None       Rosana HoesLayden, Dorraine Ellender A, PA-C 06/29/19 1814    Cathren LaineSteinl, Kevin, MD 06/29/19 2105

## 2019-06-29 NOTE — ED Notes (Signed)
Melissa [name] (spouse): [phone] Call when pt can have a visitor

## 2019-06-30 LAB — CBC
HCT: 45.5 % (ref 36.0–46.0)
Hemoglobin: 16.4 g/dL — ABNORMAL HIGH (ref 12.0–15.0)
MCH: 30.9 pg (ref 26.0–34.0)
MCHC: 36 g/dL (ref 30.0–36.0)
MCV: 85.7 fL (ref 80.0–100.0)
Platelets: 197 10*3/uL (ref 150–400)
RBC: 5.31 MIL/uL — ABNORMAL HIGH (ref 3.87–5.11)
RDW: 13.1 % (ref 11.5–15.5)
WBC: 9.7 10*3/uL (ref 4.0–10.5)
nRBC: 0 % (ref 0.0–0.2)

## 2019-06-30 LAB — GLUCOSE, CAPILLARY
Glucose-Capillary: 196 mg/dL — ABNORMAL HIGH (ref 70–99)
Glucose-Capillary: 220 mg/dL — ABNORMAL HIGH (ref 70–99)
Glucose-Capillary: 204 mg/dL — ABNORMAL HIGH (ref 70–99)
Glucose-Capillary: 350 mg/dL — ABNORMAL HIGH (ref 70–99)
Glucose-Capillary: 471 mg/dL — ABNORMAL HIGH (ref 70–99)
Glucose-Capillary: 216 mg/dL — ABNORMAL HIGH (ref 70–99)
Glucose-Capillary: 382 mg/dL — ABNORMAL HIGH (ref 70–99)
Glucose-Capillary: 330 mg/dL — ABNORMAL HIGH (ref 70–99)
Glucose-Capillary: 245 mg/dL — ABNORMAL HIGH (ref 70–99)
Glucose-Capillary: 159 mg/dL — ABNORMAL HIGH (ref 70–99)

## 2019-06-30 LAB — BASIC METABOLIC PANEL
Anion gap: 13 (ref 5–15)
Anion gap: 16 — ABNORMAL HIGH (ref 5–15)
BUN: 53 mg/dL — ABNORMAL HIGH (ref 6–20)
BUN: 56 mg/dL — ABNORMAL HIGH (ref 6–20)
CO2: 22 mmol/L (ref 22–32)
CO2: 24 mmol/L (ref 22–32)
Calcium: 10 mg/dL (ref 8.9–10.3)
Calcium: 9.6 mg/dL (ref 8.9–10.3)
Chloride: 92 mmol/L — ABNORMAL LOW (ref 98–111)
Chloride: 97 mmol/L — ABNORMAL LOW (ref 98–111)
Creatinine, Ser: 2.13 mg/dL — ABNORMAL HIGH (ref 0.44–1.00)
Creatinine, Ser: 2.47 mg/dL — ABNORMAL HIGH (ref 0.44–1.00)
GFR calc Af Amer: 24 mL/min — ABNORMAL LOW (ref >60)
GFR calc Af Amer: 28 mL/min — ABNORMAL LOW (ref >60)
GFR calc non Af Amer: 21 mL/min — ABNORMAL LOW (ref >60)
GFR calc non Af Amer: 25 mL/min — ABNORMAL LOW (ref >60)
Glucose, Bld: 251 mg/dL — ABNORMAL HIGH (ref 70–99)
Glucose, Bld: 447 mg/dL — ABNORMAL HIGH (ref 70–99)
Potassium: 3.3 mmol/L — ABNORMAL LOW (ref 3.5–5.1)
Potassium: 3.6 mmol/L (ref 3.5–5.1)
Sodium: 130 mmol/L — ABNORMAL LOW (ref 135–145)
Sodium: 134 mmol/L — ABNORMAL LOW (ref 135–145)

## 2019-06-30 LAB — HEPATITIS A ANTIBODY, IGM: Hep A IgM: NEGATIVE

## 2019-06-30 LAB — HIV ANTIBODY (ROUTINE TESTING W REFLEX): HIV Screen 4th Generation wRfx: NONREACTIVE

## 2019-06-30 MED ORDER — INSULIN GLARGINE 100 UNIT/ML ~~LOC~~ SOLN
10.0000 [IU] | Freq: Once | SUBCUTANEOUS | Status: AC
  Administered 2019-06-30: 10 [IU] via SUBCUTANEOUS
  Filled 2019-06-30: qty 0.1

## 2019-06-30 MED ORDER — WHITE PETROLATUM EX OINT
TOPICAL_OINTMENT | CUTANEOUS | Status: AC
  Administered 2019-07-01: 0.2
  Filled 2019-06-30: qty 28.35

## 2019-06-30 MED ORDER — SODIUM CHLORIDE 0.9 % IV SOLN
INTRAVENOUS | Status: DC
  Administered 2019-06-30 – 2019-07-01 (×3): via INTRAVENOUS

## 2019-06-30 MED ORDER — INSULIN GLARGINE 100 UNIT/ML ~~LOC~~ SOLN
15.0000 [IU] | Freq: Two times a day (BID) | SUBCUTANEOUS | Status: DC
  Administered 2019-06-30 – 2019-07-02 (×5): 15 [IU] via SUBCUTANEOUS
  Filled 2019-06-30 (×6): qty 0.15

## 2019-06-30 MED ORDER — INSULIN ASPART 100 UNIT/ML ~~LOC~~ SOLN
0.0000 [IU] | SUBCUTANEOUS | Status: DC
  Administered 2019-06-30: 3 [IU] via SUBCUTANEOUS
  Administered 2019-06-30: 04:00:00 5 [IU] via SUBCUTANEOUS
  Administered 2019-06-30: 11 [IU] via SUBCUTANEOUS
  Administered 2019-06-30 (×2): 5 [IU] via SUBCUTANEOUS
  Administered 2019-06-30: 15 [IU] via SUBCUTANEOUS
  Administered 2019-07-01: 2 [IU] via SUBCUTANEOUS
  Administered 2019-07-01: 11 [IU] via SUBCUTANEOUS
  Administered 2019-07-01: 5 [IU] via SUBCUTANEOUS
  Administered 2019-07-01: 17:00:00 11 [IU] via SUBCUTANEOUS
  Administered 2019-07-01: 3 [IU] via SUBCUTANEOUS
  Administered 2019-07-01 – 2019-07-02 (×2): 8 [IU] via SUBCUTANEOUS
  Administered 2019-07-02: 5 [IU] via SUBCUTANEOUS

## 2019-06-30 NOTE — Progress Notes (Signed)
PROGRESS NOTE    Melissa [name]  VZC:[id number] DOB: 07/10/1961 DOA: 06/29/2019 PCP: [facility]    Brief Narrative:  Patient is 58 year old female with history of CKD stage II, baseline creatinine 1.2, GERD, hypertension, liver cirrhosis, history of sarcoid and steroid induced diabetes in 2017, recently underwent colonoscopy with a lot of preparation presented to the emergency room with persistent nausea, unable to tolerate food, diarrhea with watery stool for 1 week after undergoing bowel prep for colonoscopy.  She was able to undergo colonoscopy, however her diarrhea never improvedSince then.  In the emergency room, she was found with significant hyperglycemia with blood sugar of 857, bicarb 25, anion gap 17 and sodium 121.  She also had a creatinine of 2.98 and lipase of 443.  CT abdomen negative for obstruction or any acute signs of infection.  She was started on insulin drip and admitted to the hospital.   Assessment & Plan:   Active Problems:   Diabetic hyperosmolar non-ketotic state ([place])  Diabetic hyperosmolar nonketotic state: A1c pending.  Currently symptomatically improved and anion gap closed.  Will start patient on Lantus 10 units twice a day, keep on sliding scale insulin, monitor blood sugars at in the hospital and further uptitrate the doses.  Patient had done insulin in the past.  She is well versed with using insulin.  Will discharge patient on insulin regimen.  Acute kidney injury with chronic kidney disease stage II: Due to #1.  Clinically improving.  Will continue on IV fluids today.  Recheck levels tomorrow.  GERD: On PPI.  Continue.  Liver cirrhosis: Liver function test are stable.  Follows up with GI.  Hypertension: Resume nadolol.  Fairly stable    DVT prophylaxis: SCDs Code Status: Full code Family Communication: None Disposition Plan: Home.  Anticipate tomorrow.   Consultants:   None  Procedures:   None   Antimicrobials:   None   Subjective: Patient was seen and examined in the morning rounds.  Patient is stated that she has no more nausea vomiting.  She wants to try to eat some food.  Anion gap closed. Patient stated polyphagia, polydipsia and polyuria for more than 4 months.  Objective: Vitals:   06/29/19 2357 06/30/19 0413 06/30/19 0824 06/30/19 1145  BP: 104/75 96/66 121/69 (!) 86/59  Pulse:  60 60 (!) 59  Resp:  16 20 20   Temp:  98.7 F (37.1 C) 98.5 F (36.9 C) 98.6 F (37 C)  TempSrc:  Oral Oral Oral  SpO2:  98% 99% 98%    Intake/Output Summary (Last 24 hours) at 06/30/2019 1503 Last data filed at 06/29/2019 2048 Gross per 24 hour  Intake 100 ml  Output 300 ml  Net -200 ml   There were no vitals filed for this visit.  Examination:  General exam: Appears calm and comfortable  Respiratory system: Clear to auscultation. Respiratory effort normal. Cardiovascular system: S1 & S2 heard, RRR. No JVD, murmurs, rubs, gallops or clicks. No pedal edema. Gastrointestinal system: Abdomen is nondistended, soft and nontender. No organomegaly or masses felt. Normal bowel sounds heard. Central nervous system: Alert and oriented. No focal neurological deficits. Extremities: Symmetric 5 x 5 power. Skin: No rashes, lesions or ulcers Psychiatry: Judgement and insight appear normal. Mood & affect appropriate.     Data Reviewed: I have personally reviewed following labs and imaging studies  CBC: Recent Labs  Lab 06/29/19 1521 06/29/19 1646 06/30/19 0404  WBC 8.8  --  9.7  HGB  18.3* 19.0* 16.4*  HCT 51.3* 56.0* 45.5  MCV 86.8  --  85.7  PLT 250  --  197   Basic Metabolic Panel: Recent Labs  Lab 06/29/19 1521 06/29/19 1646 06/29/19 2340 06/30/19 0404  NA 121* 120* 130* 134*  K 4.5 5.0 3.6 3.3*  CL 79*  --  92* 97*  CO2 25  --  22 24  GLUCOSE 857*  --  447* 251*  BUN 57*  --  56* 53*  CREATININE 2.98*  --  2.47* 2.13*  CALCIUM 10.6*  --  10.0 9.6   GFR: CrCl  cannot be calculated (Unknown ideal weight.). Liver Function Tests: Recent Labs  Lab 06/29/19 1521  AST 53*  ALT 36  ALKPHOS 173*  BILITOT 2.7*  PROT 8.7*  ALBUMIN 3.7   Recent Labs  Lab 06/29/19 1521  LIPASE 443*   No results for input(s): AMMONIA in the last 168 hours. Coagulation Profile: No results for input(s): INR, PROTIME in the last 168 hours. Cardiac Enzymes: No results for input(s): CKTOTAL, CKMB, CKMBINDEX, TROPONINI in the last 168 hours. BNP (last 3 results) No results for input(s): PROBNP in the last 8760 hours. HbA1C: No results for input(s): HGBA1C in the last 72 hours. CBG: Recent Labs  Lab 06/30/19 0111 06/30/19 0150 06/30/19 0419 06/30/19 0820 06/30/19 1309  GLUCAP 204* 196* 220* 216* 382*   Lipid Profile: No results for input(s): CHOL, HDL, LDLCALC, TRIG, CHOLHDL, LDLDIRECT in the last 72 hours. Thyroid Function Tests: No results for input(s): TSH, T4TOTAL, FREET4, T3FREE, THYROIDAB in the last 72 hours. Anemia Panel: No results for input(s): VITAMINB12, FOLATE, FERRITIN, TIBC, IRON, RETICCTPCT in the last 72 hours. Sepsis Labs: No results for input(s): PROCALCITON, LATICACIDVEN in the last 168 hours.  Recent Results (from the past 240 hour(s))  SARS Coronavirus 2 [facility](Hospital order, Performed in [facility] lab)     Status: None   Collection Time: 06/29/19  6:40 PM  Result Value Ref Range Status   SARS Coronavirus 2 NEGATIVE NEGATIVE Final    Comment: (NOTE) If result is NEGATIVE SARS-CoV-2 target nucleic acids are NOT DETECTED. The SARS-CoV-2 RNA is generally detectable in upper and lower  respiratory specimens during the acute phase of infection. The lowest  concentration of SARS-CoV-2 viral copies this assay can detect is 250  copies / mL. A negative result does not preclude SARS-CoV-2 infection  and should not be used as the sole basis for treatment or other  patient management decisions.  A negative result may occur with   improper specimen collection / handling, submission of specimen other  than nasopharyngeal swab, presence of viral mutation(s) within the  areas targeted by this assay, and inadequate number of viral copies  (<250 copies / mL). A negative result must be combined with clinical  observations, patient history, and epidemiological information. If result is POSITIVE SARS-CoV-2 target nucleic acids are DETECTED. The SARS-CoV-2 RNA is generally detectable in upper and lower  respiratory specimens dur ing the acute phase of infection.  Positive  results are indicative of active infection with SARS-CoV-2.  Clinical  correlation with patient history and other diagnostic information is  necessary to determine patient infection status.  Positive results do  not rule out bacterial infection or co-infection with other viruses. If result is PRESUMPTIVE POSTIVE SARS-CoV-2 nucleic acids MAY BE PRESENT.   A presumptive positive result was obtained on the submitted specimen  and confirmed on repeat testing.  While 2019 novel coronavirus  (SARS-CoV-2) nucleic  acids may be present in the submitted sample  additional confirmatory testing may be necessary for epidemiological  and / or clinical management purposes  to differentiate between  SARS-CoV-2 and other Sarbecovirus currently known to infect humans.  If clinically indicated additional testing with an alternate test  methodology [phone]([id number]) is advised. The SARS-CoV-2 RNA is generally  detectable in upper and lower respiratory sp ecimens during the acute  phase of infection. The expected result is Negative. Fact Sheet for Patients:  BoilerBrush.com.cyhttps://www.fda.gov/media/136312/download Fact Sheet for Healthcare Providers: https://pope.com/https://www.fda.gov/media/136313/download This test is not yet approved or cleared by the [place] FDA and has been authorized for detection and/or diagnosis of SARS-CoV-2 by FDA under an Emergency Use Authorization (EUA).  This EUA will  remain in effect (meaning this test can be used) for the duration of the COVID-19 declaration under Section 564(b)(1) of the Act, 21 U.S.C. section 360bbb-3(b)(1), unless the authorization is terminated or revoked sooner. Performed at [facility], [address] [place], [place] [address]          Radiology Studies: Ct Abdomen Pelvis Wo Contrast  Result Date: 06/29/2019 CLINICAL DATA:  Nausea and vomiting for 1 week. EXAM: CT ABDOMEN AND PELVIS WITHOUT CONTRAST TECHNIQUE: Multidetector CT imaging of the abdomen and pelvis was performed following the standard protocol without IV contrast. COMPARISON:  June 25, 2019 FINDINGS: Lower chest: No acute abnormality. Hepatobiliary: Nodular contour of liver is identified. Mild heterogeneous density of the liver is identified. The gallbladder is normal. The biliary tree is normal. Pancreas: Unremarkable. No pancreatic ductal dilatation or surrounding inflammatory changes. Spleen: Normal in size without focal abnormality. Adrenals/Urinary Tract: Adrenal glands are unremarkable. Kidneys are normal, without renal calculi, focal lesion, or hydronephrosis. Bladder is unremarkable. Stomach/Bowel: Stomach is within normal limits. Appendix appears normal. No evidence of bowel wall thickening, distention, or inflammatory changes. Vascular/Lymphatic: Aortic atherosclerosis. No enlarged abdominal or pelvic lymph nodes. Reproductive: Status post hysterectomy. No adnexal masses. Other: None Musculoskeletal: No acute abnormality is noted. IMPRESSION: No acute abnormality identified in the abdomen and pelvis. No bowel obstruction or free air. Cirrhosis of liver. Parenchymal heterogeneity is identified unchanged compared prior CT. If further evaluation is desired, MR abdomen with and without contrast is recommended. Electronically Signed   By: Sherian ReinWei-Chen  Lin M.D.   On: 06/29/2019 17:12        Scheduled Meds: . fluticasone  2 spray Each Nare Daily  . insulin  aspart  0-15 Units Subcutaneous Q4H  . insulin glargine  15 Units Subcutaneous BID  . nadolol  20 mg Oral Daily  . pantoprazole (PROTONIX) IV  40 mg Intravenous Q24H  . sodium chloride flush  3 mL Intravenous Once   Continuous Infusions: . sodium chloride 75 mL/hr at 06/30/19 0140     LOS: 1 day    Time spent: 35 minutes    Dorcas CarrowKuber Philamena Kramar, MD [facility] Pager [phone]  If 7PM-7AM, please contact night-coverage [email or web address] Password [email or web address] 06/30/2019, 3:03 PM

## 2019-07-01 LAB — GLUCOSE, CAPILLARY
Glucose-Capillary: 134 mg/dL — ABNORMAL HIGH (ref 70–99)
Glucose-Capillary: 238 mg/dL — ABNORMAL HIGH (ref 70–99)
Glucose-Capillary: 257 mg/dL — ABNORMAL HIGH (ref 70–99)
Glucose-Capillary: 314 mg/dL — ABNORMAL HIGH (ref 70–99)
Glucose-Capillary: 349 mg/dL — ABNORMAL HIGH (ref 70–99)
Glucose-Capillary: 367 mg/dL — ABNORMAL HIGH (ref 70–99)
Glucose-Capillary: 94 mg/dL (ref 70–99)

## 2019-07-01 LAB — BASIC METABOLIC PANEL
Anion gap: 10 (ref 5–15)
BUN: 40 mg/dL — ABNORMAL HIGH (ref 6–20)
CO2: 28 mmol/L (ref 22–32)
Calcium: 8.9 mg/dL (ref 8.9–10.3)
Chloride: 99 mmol/L (ref 98–111)
Creatinine, Ser: 1.8 mg/dL — ABNORMAL HIGH (ref 0.44–1.00)
GFR calc Af Amer: 35 mL/min — ABNORMAL LOW (ref >60)
GFR calc non Af Amer: 30 mL/min — ABNORMAL LOW (ref >60)
Glucose, Bld: 125 mg/dL — ABNORMAL HIGH (ref 70–99)
Potassium: 2.6 mmol/L — CL (ref 3.5–5.1)
Sodium: 137 mmol/L (ref 135–145)

## 2019-07-01 LAB — CBC WITH DIFFERENTIAL/PLATELET
Abs Immature Granulocytes: 0.01 10*3/uL (ref 0.00–0.07)
Basophils Absolute: 0 10*3/uL (ref 0.0–0.1)
Basophils Relative: 0 %
Eosinophils Absolute: 0.1 10*3/uL (ref 0.0–0.5)
Eosinophils Relative: 1 %
HCT: 43.1 % (ref 36.0–46.0)
Hemoglobin: 14.8 g/dL (ref 12.0–15.0)
Immature Granulocytes: 0 %
Lymphocytes Relative: 35 %
Lymphs Abs: 2.3 10*3/uL (ref 0.7–4.0)
MCH: 30.6 pg (ref 26.0–34.0)
MCHC: 34.3 g/dL (ref 30.0–36.0)
MCV: 89.2 fL (ref 80.0–100.0)
Monocytes Absolute: 0.7 10*3/uL (ref 0.1–1.0)
Monocytes Relative: 11 %
Neutro Abs: 3.4 10*3/uL (ref 1.7–7.7)
Neutrophils Relative %: 53 %
Platelets: 156 10*3/uL (ref 150–400)
RBC: 4.83 MIL/uL (ref 3.87–5.11)
RDW: 13.5 % (ref 11.5–15.5)
WBC: 6.5 10*3/uL (ref 4.0–10.5)
nRBC: 0 % (ref 0.0–0.2)

## 2019-07-01 LAB — MAGNESIUM: Magnesium: 2 mg/dL (ref 1.7–2.4)

## 2019-07-01 LAB — PHOSPHORUS: Phosphorus: 2 mg/dL — ABNORMAL LOW (ref 2.5–4.6)

## 2019-07-01 MED ORDER — POTASSIUM PHOSPHATES 15 MMOLE/5ML IV SOLN
20.0000 meq | Freq: Once | INTRAVENOUS | Status: AC
  Administered 2019-07-01: 08:00:00 20 meq via INTRAVENOUS
  Filled 2019-07-01: qty 4.55

## 2019-07-01 MED ORDER — POTASSIUM CHLORIDE CRYS ER 20 MEQ PO TBCR
40.0000 meq | EXTENDED_RELEASE_TABLET | Freq: Three times a day (TID) | ORAL | Status: AC
  Administered 2019-07-01 (×3): 40 meq via ORAL
  Filled 2019-07-01: qty 2
  Filled 2019-07-01: qty 4
  Filled 2019-07-01 (×2): qty 2

## 2019-07-01 MED ORDER — SODIUM CHLORIDE 0.9% FLUSH
10.0000 mL | Freq: Two times a day (BID) | INTRAVENOUS | Status: DC
  Administered 2019-07-02: 10 mL

## 2019-07-01 MED ORDER — SODIUM CHLORIDE 0.9% FLUSH: 10.0000 mL | INTRAVENOUS | Status: DC | PRN

## 2019-07-01 NOTE — Progress Notes (Signed)
PROGRESS NOTE    Ernest MallickDelana Hinton Koplin  ZOX:[id number] DOB: 11/05/1961 DOA: 06/29/2019 PCP: [facility]    Brief Narrative:  Patient is 58 year old female with history of CKD stage II, baseline creatinine 1.2, GERD, hypertension, liver cirrhosis, history of sarcoid and steroid induced diabetes in 2017, recently underwent colonoscopy with a lot of preparation presented to the emergency room with persistent nausea, unable to tolerate food, diarrhea with watery stool for 1 week after undergoing bowel prep for colonoscopy.  She was able to undergo colonoscopy, however her diarrhea never improved since then.  In the emergency room, she was found with significant hyperglycemia with blood sugar of 857, bicarb 25, anion gap 17 and sodium 121.  She also had a creatinine of 2.98 and lipase of 443.  CT abdomen negative for obstruction or any acute signs of infection.  She was started on insulin drip and admitted to the hospital.   Assessment & Plan:   Active Problems:   Diabetic hyperosmolar non-ketotic state (HCC)  Diabetic hyperosmolar nonketotic state: A1c pending.  Currently symptomatically improved and anion gap closed. Resumed on Lantus 15 units twice a day.  On sliding scale.  Her blood sugars are improving. Patient had done insulin in the past.  She is well versed with using insulin.  Will discharge patient on insulin regimen.  Depending upon her renal functions, will add metformin. She used to be on insulin in the past, was followed by endocrine.  Refer to endocrine on discharge.  Acute kidney injury with chronic kidney disease stage II: Due to #1.  Clinically improving.  Will continue on IV fluids today.  Recheck levels tomorrow.  GERD: On PPI.  Continue.  Liver cirrhosis: Liver function test are stable.  Follows up with GI.  Hypertension: Resume nadolol.  Fairly stable  Hypokalemia: Severe and persistent.  Replace aggressively by IV and oral route and recheck  levels tomorrow.  Hypophosphatemia: Replace and recheck in the morning.  Replace electrolytes.  Ambulate in the hallway.  DVT prophylaxis: SCDs Code Status: Full code Family Communication: None Disposition Plan: Home.  Anticipate tomorrow.   Consultants:   None  Procedures:   None  Antimicrobials:   None   Subjective: Patient was seen and examined in the morning rounds.  No overnight events.  She has not ambulated yet.  Denies any nausea vomiting.  Diarrhea has improved.  Objective: Vitals:   07/01/19 0309 07/01/19 0749 07/01/19 1151 07/01/19 1201  BP: 97/61 105/64 (!) 90/53   Pulse: (!) 57 61 65   Resp: 18 20 (!) 24   Temp: 98.5 F (36.9 C) 98.2 F (36.8 C) 98.2 F (36.8 C)   TempSrc: Oral Oral Oral   SpO2: 98% 100% 98%   Weight:    102 kg  Height:    5' 2.5" (1.588 m)    Intake/Output Summary (Last 24 hours) at 07/01/2019 1254 Last data filed at 07/01/2019 0300 Gross per 24 hour  Intake 2139.13 ml  Output 850 ml  Net 1289.13 ml   Filed Weights   07/01/19 1201  Weight: 102 kg    Examination:  General exam: Appears calm and comfortable  Respiratory system: Clear to auscultation. Respiratory effort normal. Cardiovascular system: S1 & S2 heard, RRR. No JVD, murmurs, rubs, gallops or clicks. No pedal edema. Gastrointestinal system: Abdomen is nondistended, soft and nontender. No organomegaly or masses felt. Normal bowel sounds heard. Central nervous system: Alert and oriented. No focal neurological deficits. Extremities: Symmetric 5 x  5 power. Skin: No rashes, lesions or ulcers Psychiatry: Judgement and insight appear normal. Mood & affect appropriate.     Data Reviewed: I have personally reviewed following labs and imaging studies  CBC: Recent Labs  Lab 06/29/19 1521 06/29/19 1646 06/30/19 0404 07/01/19 0538  WBC 8.8  --  9.7 6.5  NEUTROABS  --   --   --  3.4  HGB 18.3* 19.0* 16.4* 14.8  HCT 51.3* 56.0* 45.5 43.1  MCV 86.8  --  85.7 89.2    PLT 250  --  197 156   Basic Metabolic Panel: Recent Labs  Lab 06/29/19 1521 06/29/19 1646 06/29/19 2340 06/30/19 0404 07/01/19 0538  NA 121* 120* 130* 134* 137  K 4.5 5.0 3.6 3.3* 2.6*  CL 79*  --  92* 97* 99  CO2 25  --  22 24 28   GLUCOSE 857*  --  447* 251* 125*  BUN 57*  --  56* 53* 40*  CREATININE 2.98*  --  2.47* 2.13* 1.80*  CALCIUM 10.6*  --  10.0 9.6 8.9  MG  --   --   --   --  2.0  PHOS  --   --   --   --  2.0*   GFR: Estimated Creatinine Clearance: 37.6 mL/min (A) (by C-G formula based on SCr of 1.8 mg/dL (H)). Liver Function Tests: Recent Labs  Lab 06/29/19 1521  AST 53*  ALT 36  ALKPHOS 173*  BILITOT 2.7*  PROT 8.7*  ALBUMIN 3.7   Recent Labs  Lab 06/29/19 1521  LIPASE 443*   No results for input(s): AMMONIA in the last 168 hours. Coagulation Profile: No results for input(s): INR, PROTIME in the last 168 hours. Cardiac Enzymes: No results for input(s): CKTOTAL, CKMB, CKMBINDEX, TROPONINI in the last 168 hours. BNP (last 3 results) No results for input(s): PROBNP in the last 8760 hours. HbA1C: No results for input(s): HGBA1C in the last 72 hours. CBG: Recent Labs  Lab 06/30/19 2002 06/30/19 2328 07/01/19 0359 07/01/19 0810 07/01/19 1147  GLUCAP 245* 159* 134* 94 257*   Lipid Profile: No results for input(s): CHOL, HDL, LDLCALC, TRIG, CHOLHDL, LDLDIRECT in the last 72 hours. Thyroid Function Tests: No results for input(s): TSH, T4TOTAL, FREET4, T3FREE, THYROIDAB in the last 72 hours. Anemia Panel: No results for input(s): VITAMINB12, FOLATE, FERRITIN, TIBC, IRON, RETICCTPCT in the last 72 hours. Sepsis Labs: No results for input(s): PROCALCITON, LATICACIDVEN in the last 168 hours.  Recent Results (from the past 240 hour(s))  SARS Coronavirus 2 [facility](Hospital order, [facility])     Status: None   Collection Time: 06/29/19  6:40 PM  Result Value Ref Range Status   SARS Coronavirus 2 NEGATIVE NEGATIVE Final     Comment: (NOTE) If result is NEGATIVE SARS-CoV-2 target nucleic acids are NOT DETECTED. The SARS-CoV-2 RNA is generally detectable in upper and lower  respiratory specimens during the acute phase of infection. The lowest  concentration of SARS-CoV-2 viral copies this assay can detect is 250  copies / mL. A negative result does not preclude SARS-CoV-2 infection  and should not be used as the sole basis for treatment or other  patient management decisions.  A negative result may occur with  improper specimen collection / handling, submission of specimen other  than nasopharyngeal swab, presence of viral mutation(s) within the  areas targeted by this assay, and inadequate number of viral copies  (<250 copies / mL). A negative result must  be combined with clinical  observations, patient history, and epidemiological information. If result is POSITIVE SARS-CoV-2 target nucleic acids are DETECTED. The SARS-CoV-2 RNA is generally detectable in upper and lower  respiratory specimens dur ing the acute phase of infection.  Positive  results are indicative of active infection with SARS-CoV-2.  Clinical  correlation with patient history and other diagnostic information is  necessary to determine patient infection status.  Positive results do  not rule out bacterial infection or co-infection with other viruses. If result is PRESUMPTIVE POSTIVE SARS-CoV-2 nucleic acids MAY BE PRESENT.   A presumptive positive result was obtained on the submitted specimen  and confirmed on repeat testing.  While 2019 novel coronavirus  (SARS-CoV-2) nucleic acids may be present in the submitted sample  additional confirmatory testing may be necessary for epidemiological  and / or clinical management purposes  to differentiate between  SARS-CoV-2 and other Sarbecovirus currently known to infect humans.  If clinically indicated additional testing with an alternate test  methodology [phone]) is advised. The SARS-CoV-2  RNA is generally  detectable in upper and lower respiratory sp ecimens during the acute  phase of infection. The expected result is Negative. Fact Sheet for Patients:  StrictlyIdeas.no Fact Sheet for Healthcare Providers: BankingDealers.co.za This test is not yet approved or cleared by the [place] FDA and has been authorized for detection and/or diagnosis of SARS-CoV-2 by FDA under an Emergency Use Authorization (EUA).  This EUA will remain in effect (meaning this test can be used) for the duration of the COVID-19 declaration under Section 564(b)(1) of the Act, 21 U.S.C. section 360bbb-3(b)(1), unless the authorization is terminated or revoked sooner. Performed at [facility], [place] [address] [place], [place] [address]          Radiology Studies: Ct Abdomen Pelvis Wo Contrast  Result Date: 06/29/2019 CLINICAL DATA:  Nausea and vomiting for 1 week. EXAM: CT ABDOMEN AND PELVIS WITHOUT CONTRAST TECHNIQUE: Multidetector CT imaging of the abdomen and pelvis was performed following the standard protocol without IV contrast. COMPARISON:  June 25, 2019 FINDINGS: Lower chest: No acute abnormality. Hepatobiliary: Nodular contour of liver is identified. Mild heterogeneous density of the liver is identified. The gallbladder is normal. The biliary tree is normal. Pancreas: Unremarkable. No pancreatic ductal dilatation or surrounding inflammatory changes. Spleen: Normal in size without focal abnormality. Adrenals/Urinary Tract: Adrenal glands are unremarkable. Kidneys are normal, without renal calculi, focal lesion, or hydronephrosis. Bladder is unremarkable. Stomach/Bowel: Stomach is within normal limits. Appendix appears normal. No evidence of bowel wall thickening, distention, or inflammatory changes. Vascular/Lymphatic: Aortic atherosclerosis. No enlarged abdominal or pelvic lymph nodes. Reproductive: Status post hysterectomy. No adnexal  masses. Other: None Musculoskeletal: No acute abnormality is noted. IMPRESSION: No acute abnormality identified in the abdomen and pelvis. No bowel obstruction or free air. Cirrhosis of liver. Parenchymal heterogeneity is identified unchanged compared prior CT. If further evaluation is desired, MR abdomen with and without contrast is recommended. Electronically Signed   By: Abelardo Diesel M.D.   On: 06/29/2019 17:12        Scheduled Meds:  insulin aspart  0-15 Units Subcutaneous Q4H   insulin glargine  15 Units Subcutaneous BID   nadolol  20 mg Oral Daily   pantoprazole (PROTONIX) IV  40 mg Intravenous Q24H   potassium chloride  40 mEq Oral TID WC   sodium chloride flush  10-40 mL Intracatheter Q12H   sodium chloride flush  3 mL Intravenous Once   Continuous Infusions:  sodium chloride 75 mL/hr at 07/01/19 0532   potassium PHOSPHATE IVPB (mEq) 20 mEq (07/01/19 0828)     LOS: 2 days    Time spent: 25 minutes    Dorcas CarrowKuber Auset Fritzler, MD [facility] Pager [phone]  If 7PM-7AM, please contact night-coverage [email or web address] Password [email or web address] 07/01/2019, 12:54 PM

## 2019-07-01 NOTE — Progress Notes (Signed)
CRITICAL VALUE ALERT  Critical Value Potassium = 2.6  Date & Time Notied: 07/01/2019, 0964  Provider Notified: Mid-Level Bodenheimer   Orders Received/Actions taken:

## 2019-07-02 LAB — GLUCOSE, CAPILLARY
Glucose-Capillary: 95 mg/dL (ref 70–99)
Glucose-Capillary: 87 mg/dL (ref 70–99)
Glucose-Capillary: 214 mg/dL — ABNORMAL HIGH (ref 70–99)
Glucose-Capillary: 272 mg/dL — ABNORMAL HIGH (ref 70–99)

## 2019-07-02 LAB — BASIC METABOLIC PANEL
Anion gap: 8 (ref 5–15)
BUN: 27 mg/dL — ABNORMAL HIGH (ref 6–20)
CO2: 22 mmol/L (ref 22–32)
Calcium: 8.3 mg/dL — ABNORMAL LOW (ref 8.9–10.3)
Chloride: 107 mmol/L (ref 98–111)
Creatinine, Ser: 1.44 mg/dL — ABNORMAL HIGH (ref 0.44–1.00)
GFR calc Af Amer: 46 mL/min — ABNORMAL LOW (ref >60)
GFR calc non Af Amer: 39 mL/min — ABNORMAL LOW (ref >60)
Glucose, Bld: 250 mg/dL — ABNORMAL HIGH (ref 70–99)
Potassium: 3.7 mmol/L (ref 3.5–5.1)
Sodium: 137 mmol/L (ref 135–145)

## 2019-07-02 LAB — HEMOGLOBIN A1C
Hgb A1c MFr Bld: >15.5 % — ABNORMAL HIGH (ref 4.8–5.6)
Hgb A1c MFr Bld: 15.3 % — ABNORMAL HIGH (ref 4.8–5.6)
Mean Plasma Glucose: >398 mg/dL
Mean Plasma Glucose: 392 mg/dL

## 2019-07-02 LAB — PHOSPHORUS: Phosphorus: 1.8 mg/dL — ABNORMAL LOW (ref 2.5–4.6)

## 2019-07-02 LAB — MAGNESIUM: Magnesium: 1.7 mg/dL (ref 1.7–2.4)

## 2019-07-02 MED ORDER — BLOOD GLUCOSE MONITOR KIT: PACK | 0 refills | Status: DC

## 2019-07-02 MED ORDER — METFORMIN HCL 500 MG PO TABS: 500.0000 mg | ORAL_TABLET | Freq: Two times a day (BID) | ORAL | 0 refills | Status: DC

## 2019-07-02 MED ORDER — INSULIN DETEMIR 100 UNIT/ML FLEXPEN: 15.0000 [IU] | PEN_INJECTOR | Freq: Two times a day (BID) | SUBCUTANEOUS | 11 refills | Status: DC

## 2019-07-02 MED ORDER — BLOOD GLUCOSE MONITORING SUPPL DEVI: 1.0000 | Freq: Two times a day (BID) | Status: DC

## 2019-07-02 MED ORDER — POTASSIUM & SODIUM PHOSPHATES 280-160-250 MG PO PACK
1.0000 | PACK | Freq: Three times a day (TID) | ORAL | Status: DC
  Administered 2019-07-02 (×2): 1 via ORAL
  Filled 2019-07-02 (×4): qty 1

## 2019-07-02 MED ORDER — INSULIN PEN NEEDLE 29G X 5MM MISC: 1.0000 "application " | Freq: Two times a day (BID) | 0 refills | Status: DC

## 2019-07-02 MED ORDER — METFORMIN HCL 500 MG PO TABS
500.0000 mg | ORAL_TABLET | Freq: Two times a day (BID) | ORAL | Status: DC
  Administered 2019-07-02: 500 mg via ORAL
  Filled 2019-07-02: qty 1

## 2019-07-02 MED ORDER — POTASSIUM & SODIUM PHOSPHATES 280-160-250 MG PO PACK: 1.0000 | PACK | Freq: Three times a day (TID) | ORAL | 0 refills | Status: AC

## 2019-07-02 MED ORDER — POTASSIUM CHLORIDE CRYS ER 20 MEQ PO TBCR: 20.0000 meq | EXTENDED_RELEASE_TABLET | Freq: Every day | ORAL | 0 refills | Status: DC

## 2019-07-02 MED ORDER — LIVING WELL WITH DIABETES BOOK
Freq: Once | Status: AC
  Administered 2019-07-02: 14:00:00
  Filled 2019-07-02: qty 1

## 2019-07-02 NOTE — Progress Notes (Signed)
Patient awaiting her ride; spouse can't get here til 5:30 pm; discharge instructions given and reviewed;Rx sent electronically and 1 written Rx given to patient for her diabetic supplies; will review discharge instructions with her spouse per patient's request; patient will be dressed and ready to depart when her ride arrives.

## 2019-07-02 NOTE — Progress Notes (Addendum)
Ambulated in the hallway independently; ortho static vital signs obtained prior to the walk and BP recheck upon return to the room; patient denies shortness of breath; just feels hot after walking; denies any dizziness.

## 2019-07-02 NOTE — Discharge Instructions (Signed)
Hemoglobin A1c Test Why am I having this test? You may have the hemoglobin A1c test (HbA1c test) done to:  Evaluate your risk for developing diabetes (diabetes mellitus).  Diagnose diabetes.  Monitor long-term control of blood sugar (glucose) in people who have diabetes and help make treatment decisions. This test may be done with other blood glucose tests, such as fasting blood glucose and oral glucose tolerance tests. What is being tested? Hemoglobin is a type of protein in the blood that carries oxygen. Glucose attaches to hemoglobin to form glycated hemoglobin. This test checks the amount of glycated hemoglobin in your blood, which is a good indicator of the average amount of glucose in your blood during the past 2-3 months. What kind of sample is taken?  A blood sample is required for this test. It is usually collected by inserting a needle into a blood vessel. Tell a health care provider about:  All medicines you are taking, including vitamins, herbs, eye drops, creams, and over-the-counter medicines.  Any blood disorders you have.  Any surgeries you have had.  Any medical conditions you have.  Whether you are pregnant or may be pregnant. How are the results reported? Your results will be reported as a percentage that indicates how much of your hemoglobin has glucose attached to it (is glycated). Your health care provider will compare your results to normal ranges that were established after testing a large group of people (reference ranges). Reference ranges may vary among labs and hospitals. For this test, common reference ranges are:  Adult or child without diabetes: 4-5.6%.  Adult or child with diabetes and good blood glucose control: less than 7%. What do the results mean? If you have diabetes:  A result of less than 7% is considered normal, meaning that your blood glucose is well controlled.  A result higher than 7% means that your blood glucose is not well  controlled, and your treatment plan may need to be adjusted. If you do not have diabetes:  A result within the reference range is considered normal, meaning that you are not at high risk for diabetes.  A result of 5.7-6.4% means that you have a high risk of developing diabetes, and you may have prediabetes. Prediabetes is the condition of having a blood glucose level that is higher than it should be, but not high enough for you to be diagnosed with diabetes. Having prediabetes puts you at risk for developing type 2 diabetes (type 2 diabetes mellitus). You may have more tests, including a repeat HbA1c test.  Results of 6.5% or higher on two separate HbA1c tests mean that you have diabetes. You may have more tests to confirm the diagnosis. Abnormally low HbA1c values may be caused by:  Pregnancy.  Severe blood loss.  Receiving donated blood (transfusions).  Low red blood cell count (anemia).  Long-term kidney failure.  Some unusual forms (variants) of hemoglobin. Talk with your health care provider about what your results mean. Questions to ask your health care provider Ask your health care provider, or the department that is doing the test:  When will my results be ready?  How will I get my results?  What are my treatment options?  What other tests do I need?  What are my next steps? Summary  The hemoglobin A1c test (HbA1c test) may be done to evaluate your risk for developing diabetes, to diagnose diabetes, and to monitor long-term control of blood sugar (glucose) in people who have diabetes and help make  treatment decisions.  Hemoglobin is a type of protein in the blood that carries oxygen. Glucose attaches to hemoglobin to form glycated hemoglobin. This test checks the amount of glycated hemoglobin in your blood, which is a good indicator of the average amount of glucose in your blood during the past 2-3 months.  Talk with your health care provider about what your results  mean. This information is not intended to replace advice given to you by your health care provider. Make sure you discuss any questions you have with your health care provider. Document Released: 12/07/2004 Document Revised: 10/28/2017 Document Reviewed: 06/28/2017 Elsevier Patient Education  2020 [place]. Blood Glucose Monitoring, Adult Monitoring your blood sugar (glucose) is an important part of managing your diabetes (diabetes mellitus). Blood glucose monitoring involves checking your blood glucose as often as directed and keeping a record (log) of your results over time. Checking your blood glucose regularly and keeping a blood glucose log can:  Help you and your health care provider adjust your diabetes management plan as needed, including your medicines or insulin.  Help you understand how food, exercise, illnesses, and medicines affect your blood glucose.  Let you know what your blood glucose is at any time. You can quickly find out if you have low blood glucose (hypoglycemia) or high blood glucose (hyperglycemia). Your health care provider will set individualized treatment goals for you. Your goals will be based on your age, other medical conditions you have, and how you respond to diabetes treatment. Generally, the goal of treatment is to maintain the following blood glucose levels:  Before meals (preprandial): 80-130 mg/dL (4.4-7.2 mmol/L).  After meals (postprandial): below 180 mg/dL (10 mmol/L).  A1c level: less than 7%. Supplies needed:  Blood glucose meter.  Test strips for your meter. Each meter has its own strips. You must use the strips that came with your meter.  A needle to prick your finger (lancet). Do not use a lancet more than one time.  A device that holds the lancet (lancing device).  A journal or log book to write down your results. How to check your blood glucose  1. Wash your hands with soap and water. 2. Prick the side of your finger (not the tip)  with the lancet. Use a different finger each time. 3. Gently rub the finger until a small drop of blood appears. 4. Follow instructions that come with your meter for inserting the test strip, applying blood to the strip, and using your blood glucose meter. 5. Write down your result and any notes. Some meters allow you to use areas of your body other than your finger (alternative sites) to test your blood. The most common alternative sites are:  Forearm.  Thigh.  Palm of the hand. If you think you may have hypoglycemia, or if you have a history of not knowing when your blood glucose is getting low (hypoglycemia unawareness), do not use alternative sites. Use your finger instead. Alternative sites may not be as accurate as the fingers, because blood flow is slower in these areas. This means that the result you get may be delayed, and it may be different from the result that you would get from your finger. Follow these instructions at home: Blood glucose log   Every time you check your blood glucose, write down your result. Also write down any notes about things that may be affecting your blood glucose, such as your diet and exercise for the day. This information can help you and  your health care provider: ? Look for patterns in your blood glucose over time. ? Adjust your diabetes management plan as needed.  Check if your meter allows you to download your records to a computer. Most glucose meters store a record of glucose readings in the meter. If you have type 1 diabetes:  Check your blood glucose 2 or more times a day.  Also check your blood glucose: ? Before every insulin injection. ? Before and after exercise. ? Before meals. ? 2 hours after a meal. ? Occasionally between 2:00 a.m. and 3:00 a.m., as directed. ? Before potentially dangerous tasks, like driving or using heavy machinery. ? At bedtime.  You may need to check your blood glucose more often, up to 6-10 times a day, if  you: ? Use an insulin pump. ? Need multiple daily injections (MDI). ? Have diabetes that is not well-controlled. ? Are ill. ? Have a history of severe hypoglycemia. ? Have hypoglycemia unawareness. If you have type 2 diabetes:  If you take insulin or other diabetes medicines, check your blood glucose 2 or more times a day.  If you are on intensive insulin therapy, check your blood glucose 4 or more times a day. Occasionally, you may also need to check between 2:00 a.m. and 3:00 a.m., as directed.  Also check your blood glucose: ? Before and after exercise. ? Before potentially dangerous tasks, like driving or using heavy machinery.  You may need to check your blood glucose more often if: ? Your medicine is being adjusted. ? Your diabetes is not well-controlled. ? You are ill. General tips  Always keep your supplies with you.  If you have questions or need help, all blood glucose meters have a 24-hour "hotline" phone number that you can call. You may also contact your health care provider.  After you use a few boxes of test strips, adjust (calibrate) your blood glucose meter by following instructions that came with your meter. Contact a health care provider if:  Your blood glucose is at or above 240 mg/dL (13.3 mmol/L) for 2 days in a row.  You have been sick or have had a fever for 2 days or longer, and you are not getting better.  You have any of the following problems for more than 6 hours: ? You cannot eat or drink. ? You have nausea or vomiting. ? You have diarrhea. Get help right away if:  Your blood glucose is lower than 54 mg/dL (3 mmol/L).  You become confused or you have trouble thinking clearly.  You have difficulty breathing.  You have moderate or large ketone levels in your urine. Summary  Monitoring your blood sugar (glucose) is an important part of managing your diabetes (diabetes mellitus).  Blood glucose monitoring involves checking your blood  glucose as often as directed and keeping a record (log) of your results over time.  Your health care provider will set individualized treatment goals for you. Your goals will be based on your age, other medical conditions you have, and how you respond to diabetes treatment.  Every time you check your blood glucose, write down your result. Also write down any notes about things that may be affecting your blood glucose, such as your diet and exercise for the day. This information is not intended to replace advice given to you by your health care provider. Make sure you discuss any questions you have with your health care provider. Document Released: 11/18/2003 Document Revised: 09/08/2018 Document Reviewed: 04/26/2016 Elsevier  Patient Education  [employer]. Insulin Injection Instructions, Mixing Two Insulins, Adult A subcutaneous injection is a shot of medicine that is injected into the layer of fat and tissue between skin and muscle. People with type 1 diabetes must take insulin because their bodies do not make it. People with type 2 diabetes may need to take insulin.  There are many different types of insulin. The type of insulin that you take may determine how many injections you give yourself and when you need to give the injections. You may be instructed to combine two types of insulin for your injection. When mixing insulin in the same syringe:  The CLEAR insulin is rapid-acting or regular insulin.  The CLOUDY insulin is intermediate or long-acting insulin. It may also be called NPH insulin. Supplies needed:  Soap and water to wash hands.  A new, unused insulin syringe.  Your insulin medication bottles (vials).  Alcohol wipes.  A disposal container that is meant for sharp items (sharps container), such as an empty plastic bottle with a cover. How to choose a site for injection The body absorbs insulin differently, depending on where the insulin is injected (injection site). It  is best to inject insulin into the same body area each time (for example, always in the abdomen), but you should use a different spot in that area for each injection. Do not inject the insulin in the same spot each time. There are five main areas that can be used for injecting. These areas include:  Abdomen. This is the preferred area.  Front of thigh.  Upper, outer side of thigh.  Upper, outer side of arm.  Upper, outer part of buttock. How to give an insulin injection using mixed insulin First, follow the steps for Get ready, then continue with the steps for Push air into the vials, then follow the steps for Combine clear insulin and cloudy insulin, and finish with the steps for Inject the insulin mixture. Get ready 1. Wash your hands with soap and water. If soap and water are not available, use hand sanitizer. 2. Before you give yourself an insulin injection, be sure to test your blood sugar level (blood glucose level) and write down that number. Follow any instructions from your health care provider about what to do if your blood glucose level is higher or lower than your normal range. 3. Use a new, unused insulin syringe each time you need to inject insulin. 4. Check to make sure you have the correct type of insulin syringe for the concentration of insulin that you are using. 5. Check the expiration dates and the types of insulin that you are using. 6. Check the CLEAR insulin. It should be clear and free of clumps. 7. Do not shake the CLOUDY insulin vial to get it ready. Instead, get it ready in one of these ways: ? Gently roll that insulin vial between your palms several times. ? Tip that vial up and down several times. 8. Remove the plastic pop-top covering from the vial of CLEAR insulin and the vial of CLOUDY insulin. This type of covering is present on a vial when it is new. 9. Use an alcohol wipe to clean the rubber top of each vial. 10. Remove the plastic cover from the syringe  needle. Do not let the needle touch anything. Push air into the vials  1. To bring (draw up) air into the syringe, slowly pull back on the syringe plunger. Stop pulling the plunger when the dose  indicator gets to the number of units that you will be using for CLOUDY insulin. 2. While you keep the vial of CLOUDY insulin right-side-up, poke the needle through the rubber top of the vial. Do not turn the vial upside down to do this. 3. Push the plunger all the way into the syringe. Doing that will push air into the vial of CLOUDY insulin. 4. Remove the needle from the vial of CLOUDY insulin. Do not turn the vial upside down to do this. 5. Slowly pull back on the plunger to repeat the step for drawing up air into the syringe. Stop pulling the plunger when the dose indicator gets to the number of units that you will be using for CLEAR insulin. 6. While you keep the vial of CLEAR insulin right-side-up, poke the needle through the rubber top of the vial. Do not turn the vial upside down to do this. 7. Push the plunger all the way into the syringe. Doing that will push air into the vial of CLEAR insulin. 8. Do not take the needle out of the vial of CLEAR insulin yet. Combine clear insulin and cloudy insulin 1. While the needle is still in the vial of CLEAR insulin, turn that vial upside down and hold it at eye level. 2. Slowly pull back on the plunger to draw up the desired number of CLEAR insulin units into the syringe. 3. If you see air bubbles in the syringe, slowly move the plunger up and down 2 or 3 times to make them go away. 4. Remove the needle from the vial of CLEAR insulin. 5. Poke the needle of the partly filled syringe into the vial of CLOUDY insulin, turn the vial upside down, then hold the vial at eye level. Do not inject any of the CLEAR insulin (which is already in the syringe) into the vial of CLOUDY insulin. 6. Slowly pull back on the plunger until it gets to the number of units that is  equal to the total number of units desired (CLEAR insulin units plus CLOUDY insulin units). 7. Remove the needle from the vial of CLOUDY insulin. 8. Do not let the needle touch anything. Inject the insulin mixture  1. Use an alcohol wipe to clean the site where you will be injecting the needle. Let the site air-dry. 2. Hold the syringe in your writing hand like a pencil. 3. Use your other hand to pinch and hold about an inch (2.5 cm) of skin. Do not directly touch the cleaned part of the skin. 4. Gently but quickly, put the needle straight into the skin. The needle should be at a 90-degree angle (perpendicular) to the skin. 5. Push the needle in as far as it will go (to the hub). 6. When the needle is completely inserted into the skin, use your thumb or index finger of your writing hand to push the plunger all the way into the syringe to inject the insulin. 7. Let go of the skin that you are pinching. Continue to hold the syringe in place with your writing hand. 8. Wait 10 seconds, then pull the needle straight out of the skin. This will allow all of the insulin to go from the syringe and needle into your body. 9. Press and hold the alcohol wipe over the injection site until any bleeding stops. Do not rub the area. 10. Do not put the plastic cover back on the needle. 11. Discard the syringe and needle directly into a sharps container, such  as an empty plastic bottle with a cover. How to throw away supplies  Discard all used needles in a puncture-proof sharps disposal container. You can ask your local pharmacy about where you can get this kind of disposal container, or you can use an empty plastic liquid laundry detergent bottle that has a cover.  Follow the disposal regulations for the area where you live. Do not use any syringe or needle more than one time.  Throw away empty vials in the regular trash. Questions to ask your health care provider  How often should I be taking insulin?  How  often should I check my blood glucose?  What amount of CLEAR insulin do I need for each injection?  What amount of CLOUDY insulin do I need for each injection?  What are the side effects?  What should I do if my blood glucose is too high?  What should I do if my blood glucose is too low?  What should I do if I forget to take my insulin?  What number should I call if I have questions? Where to find more information  American Diabetes Association (ADA): www.diabetes.org  American Association of Diabetes Educators (AADE) Patient Resources: https://www.diabeteseducator.org Summary  A subcutaneous injection is a shot of medicine that is injected into the layer of fat and tissue between skin and muscle.  Before you give yourself an insulin injection, be sure to test your blood sugar level (blood glucose level) and write down that number.  The type of insulin that you take may determine how many injections you give yourself and when you need to give the injections.  Check the expiration dates and the types of insulin that you are using.  It is best to inject insulin into the same body area each time (for example, always in the abdomen), but you should use a different spot in that area for each injection. This information is not intended to replace advice given to you by your health care provider. Make sure you discuss any questions you have with your health care provider. Document Released: 12/19/2015 Document Revised: 11/18/2017 Document Reviewed: 12/19/2015 Elsevier Patient Education  2020 [place]. Hypoglycemia Hypoglycemia is when the sugar (glucose) level in your blood is too low. Signs of low blood sugar may include:  Feeling: ? Hungry. ? Worried or nervous (anxious). ? Sweaty and clammy. ? Confused. ? Dizzy. ? Sleepy. ? Sick to your stomach (nauseous).  Having: ? A fast heartbeat. ? A headache. ? A change in your vision. ? Tingling or no feeling (numbness)  around your mouth, lips, or tongue. ? Jerky movements that you cannot control (seizure).  Having trouble with: ? Moving (coordination). ? Sleeping. ? Passing out (fainting). ? Getting upset easily (irritability). Low blood sugar can happen to people who have diabetes and people who do not have diabetes. Low blood sugar can happen quickly, and it can be an emergency. Treating low blood sugar Low blood sugar is often treated by eating or drinking something sugary right away, such as:  Fruit juice, 4-6 oz (120-150 mL).  Regular soda (not diet soda), 4-6 oz (120-150 mL).  Low-fat milk, 4 oz (120 mL).  Several pieces of hard candy.  Sugar or honey, 1 Tbsp (15 mL). Treating low blood sugar if you have diabetes If you can think clearly and swallow safely, follow the 15:15 rule:  Take 15 grams of a fast-acting carb (carbohydrate). Talk with your doctor about how much you should take.  Always  keep a source of fast-acting carb with you, such as: ? Sugar tablets (glucose pills). Take 3-4 pills. ? 6-8 pieces of hard candy. ? 4-6 oz (120-150 mL) of fruit juice. ? 4-6 oz (120-150 mL) of regular (not diet) soda. ? 1 Tbsp (15 mL) honey or sugar.  Check your blood sugar 15 minutes after you take the carb.  If your blood sugar is still at or below 70 mg/dL (3.9 mmol/L), take 15 grams of a carb again.  If your blood sugar does not go above 70 mg/dL (3.9 mmol/L) after 3 tries, get help right away.  After your blood sugar goes back to normal, eat a meal or a snack within 1 hour.  Treating very low blood sugar If your blood sugar is at or below 54 mg/dL (3 mmol/L), you have very low blood sugar (severe hypoglycemia). This may also cause:  Passing out.  Jerky movements you cannot control (seizure).  Losing consciousness (coma). This is an emergency. Do not wait to see if the symptoms will go away. Get medical help right away. Call your local emergency services (911 in the [place] Do not  drive yourself to the hospital. If you have very low blood sugar and you cannot eat or drink, you may need a glucagon shot (injection). A family member or friend should learn how to check your blood sugar and how to give you a glucagon shot. Ask your doctor if you need to have a glucagon shot kit at home. Follow these instructions at home: General instructions  Take over-the-counter and prescription medicines only as told by your doctor.  Stay aware of your blood sugar as told by your doctor.  Limit alcohol intake to no more than 1 drink a day for nonpregnant women and 2 drinks a day for men. One drink equals 12 oz of beer (355 mL), 5 oz of wine (148 mL), or 1 oz of hard liquor (44 mL).  Keep all follow-up visits as told by your doctor. This is important. If you have diabetes:   Follow your diabetes care plan as told by your doctor. Make sure you: ? Know the signs of low blood sugar. ? Take your medicines as told. ? Follow your exercise and meal plan. ? Eat on time. Do not skip meals. ? Check your blood sugar as often as told by your doctor. Always check it before and after exercise. ? Follow your sick day plan when you cannot eat or drink normally. Make this plan ahead of time with your doctor.  Share your diabetes care plan with: ? Your work or school. ? People you live with.  Check your pee (urine) for ketones: ? When you are sick. ? As told by your doctor.  Carry a card or wear jewelry that says you have diabetes. Contact a doctor if:  You have trouble keeping your blood sugar in your target range.  You have low blood sugar often. Get help right away if:  You still have symptoms after you eat or drink something sugary.  Your blood sugar is at or below 54 mg/dL (3 mmol/L).  You have jerky movements that you cannot control.  You pass out. These symptoms may be an emergency. Do not wait to see if the symptoms will go away. Get medical help right away. Call your local  emergency services (911 in the [place] Do not drive yourself to the hospital. Summary  Hypoglycemia happens when the level of sugar (glucose) in your blood is  too low.  Low blood sugar can happen to people who have diabetes and people who do not have diabetes. Low blood sugar can happen quickly, and it can be an emergency.  Make sure you know the signs of low blood sugar and know how to treat it.  Always keep a source of sugar (fast-acting carb) with you to treat low blood sugar. This information is not intended to replace advice given to you by your health care provider. Make sure you discuss any questions you have with your health care provider. Document Released: 02/09/2010 Document Revised: 03/08/2019 Document Reviewed: 12/19/2015 Elsevier Patient Education  [place]. Hyperglycemia Hyperglycemia occurs when the level of sugar (glucose) in the blood is too high. Glucose is a type of sugar that provides the body's main source of energy. Certain hormones (insulin and glucagon) control the level of glucose in the blood. Insulin lowers blood glucose, and glucagon increases blood glucose. Hyperglycemia can result from having too little insulin in the bloodstream, or from the body not responding normally to insulin. Hyperglycemia occurs most often in people who have diabetes (diabetes mellitus), but it can happen in people who do not have diabetes. It can develop quickly, and it can be life-threatening if it causes you to become severely dehydrated (diabetic ketoacidosis or hyperglycemic hyperosmolar state). Severe hyperglycemia is a medical emergency. What are the causes? If you have diabetes, hyperglycemia may be caused by:  Diabetes medicine.  Medicines that increase blood glucose or affect your diabetes control.  Not eating enough, or not eating often enough.  Changes in physical activity level.  Being sick or having an infection. If you have prediabetes or undiagnosed  diabetes:  Hyperglycemia may be caused by those conditions. If you do not have diabetes, hyperglycemia may be caused by:  Certain medicines, including steroid medicines, beta-blockers, epinephrine, and thiazide diuretics.  Stress.  Serious illness.  Surgery.  Diseases of the pancreas.  Infection. What increases the risk? Hyperglycemia is more likely to develop in people who have risk factors for diabetes, such as:  Having a family member with diabetes.  Having a gene for type 1 diabetes that is passed from parent to child (inherited).  Living in an area with cold weather conditions.  Exposure to certain viruses.  Certain conditions in which the body's disease-fighting (immune) system attacks itself (autoimmune disorders).  Being overweight or obese.  Having an inactive (sedentary) lifestyle.  Having been diagnosed with insulin resistance.  Having a history of prediabetes, gestational diabetes, or polycystic ovarian syndrome (PCOS).  Being of American-Indian, African-American, Hispanic/Latino, or Asian/Pacific Islander descent. What are the signs or symptoms? Hyperglycemia may not cause any symptoms. If you do have symptoms, they may include early warning signs, such as:  Increased thirst.  Hunger.  Feeling very tired.  Needing to urinate more often than usual.  Blurry vision. Other symptoms may develop if hyperglycemia gets worse, such as:  Dry mouth.  Loss of appetite.  Fruity-smelling breath.  Weakness.  Unexpected or rapid weight gain or weight loss.  Tingling or numbness in the hands or feet.  Headache.  Skin that does not quickly return to normal after being lightly pinched and released (poor skin turgor).  Abdominal pain.  Cuts or bruises that are slow to heal. How is this diagnosed? Hyperglycemia is diagnosed with a blood test to measure your blood glucose level. This blood test is usually done while you are having symptoms. Your health  care provider may also do a physical  exam and review your medical history. You may have more tests to determine the cause of your hyperglycemia, such as:  A fasting blood glucose (FBG) test. You will not be allowed to eat (you will fast) for at least 8 hours before a blood sample is taken.  An A1c (hemoglobin A1c) blood test. This provides information about blood glucose control over the previous 2-3 months.  An oral glucose tolerance test (OGTT). This measures your blood glucose at two times: ? After fasting. This is your baseline blood glucose level. ? Two hours after drinking a beverage that contains glucose. How is this treated? Treatment depends on the cause of your hyperglycemia. Treatment may include:  Taking medicine to regulate your blood glucose levels. If you take insulin or other diabetes medicines, your medicine or dosage may be adjusted.  Lifestyle changes, such as exercising more, eating healthier foods, or losing weight.  Treating an illness or infection, if this caused your hyperglycemia.  Checking your blood glucose more often.  Stopping or reducing steroid medicines, if these caused your hyperglycemia. If your hyperglycemia becomes severe and it results in hyperglycemic hyperosmolar state, you must be hospitalized and given IV fluids. Follow these instructions at home:  General instructions  Take over-the-counter and prescription medicines only as told by your health care provider.  Do not use any products that contain nicotine or tobacco, such as cigarettes and e-cigarettes. If you need help quitting, ask your health care provider.  Limit alcohol intake to no more than 1 drink per day for nonpregnant women and 2 drinks per day for men. One drink equals 12 oz of beer, 5 oz of wine, or 1 oz of hard liquor.  Learn to manage stress. If you need help with this, ask your health care provider.  Keep all follow-up visits as told by your health care provider. This is  important. Eating and drinking   Maintain a healthy weight.  Exercise regularly, as directed by your health care provider.  Stay hydrated, especially when you exercise, get sick, or spend time in hot temperatures.  Eat healthy foods, such as: ? Lean proteins. ? Complex carbohydrates. ? Fresh fruits and vegetables. ? Low-fat dairy products. ? Healthy fats.  Drink enough fluid to keep your urine clear or pale yellow. If you have diabetes:  Make sure you know the symptoms of hyperglycemia.  Follow your diabetes management plan, as told by your health care provider. Make sure you: ? Take your insulin and medicines as directed. ? Follow your exercise plan. ? Follow your meal plan. Eat on time, and do not skip meals. ? Check your blood glucose as often as directed. Make sure to check your blood glucose before and after exercise. If you exercise longer or in a different way than usual, check your blood glucose more often. ? Follow your sick day plan whenever you cannot eat or drink normally. Make this plan in advance with your health care provider.  Share your diabetes management plan with people in your workplace, school, and household.  Check your urine for ketones when you are ill and as told by your health care provider.  Carry a medical alert card or wear medical alert jewelry. Contact a health care provider if:  Your blood glucose is at or above 240 mg/dL (13.3 mmol/L) for 2 days in a row.  You have problems keeping your blood glucose in your target range.  You have frequent episodes of hyperglycemia. Get help right away if:  You  have difficulty breathing.  You have a change in how you think, feel, or act (mental status).  You have nausea or vomiting that does not go away. These symptoms may represent a serious problem that is an emergency. Do not wait to see if the symptoms will go away. Get medical help right away. Call your local emergency services (911 in the [place]  Do not drive yourself to the hospital. Summary  Hyperglycemia occurs when the level of sugar (glucose) in the blood is too high.  Hyperglycemia is diagnosed with a blood test to measure your blood glucose level. This blood test is usually done while you are having symptoms. Your health care provider may also do a physical exam and review your medical history.  If you have diabetes, follow your diabetes management plan as told by your health care provider.  Contact your health care provider if you have problems keeping your blood glucose in your target range. This information is not intended to replace advice given to you by your health care provider. Make sure you discuss any questions you have with your health care provider. Document Released: 05/11/2001 Document Revised: 08/02/2016 Document Reviewed: 08/02/2016 Elsevier Patient Education  2020 [employer].

## 2019-07-02 NOTE — Progress Notes (Signed)
Patient discharged; husband accompanied patient home.

## 2019-07-02 NOTE — Progress Notes (Addendum)
Inpatient Diabetes Program Recommendations  AACE/ADA: New Consensus Statement on Inpatient Glycemic Control (2015)  Target Ranges:  Prepandial:   less than 140 mg/dL      Peak postprandial:   less than 180 mg/dL (1-2 hours)      Critically ill patients:  140 - 180 mg/dL   Lab Results  Component Value Date   GLUCAP 214 (H) 07/02/2019   HGBA1C 15.3 (H) 07/01/2019    Review of Glycemic Control Results for [name], Melissa [name] (MRN [id number]) as of 07/02/2019 12:42  Ref. Range 07/02/2019 04:07 07/02/2019 07:27 07/02/2019 11:42  Glucose-Capillary Latest Ref Range: 70 - 99 mg/dL 95 87 214 (H)   Diabetes history: Type 2 DM Outpatient Diabetes medications: none Current orders for Inpatient glycemic control: Novolog 0-15 units Q4H  Inpatient Diabetes Program Recommendations:   Now that patient has diet order consider: - adding Novolog 5 units TID (assuming patient consumes >50% of meal).  - Novolog 0-15 units TID & HS  Will consult dietitian, LWWDM, OP education and insulin starter kit.   Addendum@1400: Spoke with patient regarding outpatient diabetes management. Patient was diagnosed in 2017, however it was thought to be related to steroids and thus medications were discontinued. Patient was followed by Dr Gherghe, outpatient endocrinology.  Reviewed patient's current A1c of 15.3%. Explained what a A1c is and what it measures. Also reviewed goal A1c with patient, importance of good glucose control @ home, and blood sugar goals. Reviewed patho of DM, need for insulin, HHNK, role of pancreas, signs and symptoms of hypoglycemia vs hyperglycemia, interventions, vascular changes and commorbidties. Patient will need a glucose meter. Blood glucose meter (includes lancets and strips) ([id number]). Encouraged to check glucose 3-4 times per day and reviewed frequency. Also, reviewed ADA recommendations for increasing activity and benefits towards insulin resistance.  Patient denies drinking juices and sodas,  however was sipping on juice at Melissa bedside. Reviewed carb counting, carb allotment, and how to read nutritional labels. Discussed goal setting and Melissa importance of follow up with PCP and Dr Gherghe to establish better control.  Patient has performed injections in Melissa past and is familiar with insulin pen. She also states her husband can help her.  Prefers insulin pens.  Patient provided coday cards and has no further questions at this time.   Thanks,   Thanks, Lauren McDaniel, MSN, RNC-[facility] Diabetes Coordinator [phone] (8a-5p)   

## 2019-07-02 NOTE — TOC Transition Note (Signed)
Transition of Care [facility]) - CM/SW Discharge Note   Patient Details  Name: Melissa [name] MRN: [id number] Date of Birth: 1961/06/06  [facility] CM/SW Contact:  Pollie Friar, RN Phone Number: 07/02/2019, 2:26 PM   Clinical Narrative:    Pt discharging home with self care. Pt has prescriptions for new glucose meter. CM provided her with coupon for Levemir to assist with the cost.  Pt has transportation home.   Final next level of care: Home/Self Care Barriers to Discharge: No Barriers Identified   Patient Goals and CMS Choice        Discharge Placement                       Discharge Plan and Services                                     Social Determinants of Health (SDOH) Interventions     Readmission Risk Interventions No flowsheet data found.

## 2019-07-02 NOTE — Progress Notes (Addendum)
Midline does not have a blood return for lab draws; midline flushes and IV fluids are patent. Lab recalled to draw patient's labs.

## 2019-07-02 NOTE — Discharge Summary (Signed)
Physician Discharge Summary  Melissa [name] CNO:[id number] DOB: 05/22/1961 DOA: 06/29/2019  PCP: [facility]  Admit date: 06/29/2019 Discharge date: 07/02/2019  Admitted From: home  Disposition:  Home   Recommendations for Outpatient Follow-up:  1. Follow up with PCP in 1-2 weeks 2. Please obtain BMP/CBC in one week 3. Please keep a logbook of your blood sugars at home and bring it to doctor's visit.  Home Health: Not applicable Equipment/Devices: Not applicable  Discharge Condition: Stable CODE STATUS: Full code Diet recommendation: Low carbohydrate diet  Brief/Interim Summary: Patient is 58 year old female with history of CKD stage II, baseline creatinine 1.2, GERD, hypertension, liver cirrhosis, history of sarcoid and steroid induced diabetes in 2017, recently underwent colonoscopy with a lot of preparation presented to the emergency room with persistent nausea, unable to tolerate food, diarrhea with watery stool for 1 week after undergoing bowel prep for colonoscopy.  She was able to undergo colonoscopy, however her diarrhea never improved since then.  In the emergency room, she was found with significant hyperglycemia with blood sugar of 857, bicarb 25, anion gap 17 and sodium 121.  She also had a creatinine of 2.98 and lipase of 443.  CT abdomen negative for obstruction or any acute signs of infection.  She was started on insulin drip and admitted to the hospital.  Discharge Diagnoses:  Active Problems:   Diabetic hyperosmolar non-ketotic state ([place])  Hemoglobin A1c was 15.  Patient was diagnosed with type 2 diabetes with hemoglobin A1c of 14 in 2017, she was on insulin and metformin, she improved and was off treatment for last 4 years, A1c was less than 6.  Presented with blood sugars more than 800, treated with insulin drip and change to subcu insulin.   -To simplify regimen, patient will go home with Lantus 15 units twice a day, all supplies  were sent to her pharmacy.  We will also start patient on metformin 500 mg twice a day as her renal functions are improved.  Patient will keep monitoring her blood sugars at home.  Will refer her to her endocrinologist for follow-up.  Patient was extensively educated about home glucose monitoring, hypoglycemic symptoms.  She presented with acute kidney injury with history of chronic kidney disease with baseline creatinine about 1.2-1.4.  Adequately improved now.  Patient has been taking PPI that she will continue.  She is also on nadolol and hydrochlorothiazide.  Her potassium and phosphate were low, replaced before discharge, will prescribe for 7 days of oral replacement for home.  Discharge Instructions  Discharge Instructions    Ambulatory referral to Endocrinology   Complete by: As directed    Ambulatory referral to Nutrition and Diabetic Education   Complete by: As directed    Diet - low sodium heart healthy   Complete by: As directed    Discharge instructions   Complete by: As directed    Learn about symptoms of low blood sugars and keep some sugars with you  Keep a log of the sugars at home and take it to your doctors visit.   Increase activity slowly   Complete by: As directed      Allergies as of 07/02/2019      Reactions   Codeine Nausea And Vomiting   Erythromycin Nausea And Vomiting   Ketek [telithromycin] Nausea And Vomiting   Oxycodone-acetaminophen Nausea And Vomiting   Penicillins Nausea And Vomiting   Did it involve swelling of the face/tongue/throat, SOB, or low BP? No Did  it involve sudden or severe rash/hives, skin peeling, or any reaction on the inside of your mouth or nose? No Did you need to seek medical attention at a hospital or doctor's office? No When did it last happen?teenager If all above answers are "NO", may proceed with cephalosporin use.   Propoxyphene N-acetaminophen Nausea And Vomiting   Sulfonamide Derivatives Nausea And Vomiting   Vicodin  [hydrocodone-acetaminophen] Nausea And Vomiting      Medication List    STOP taking these medications   Blink Tears 0.25 % Soln Generic drug: Polyethylene Glycol 400   doxycycline 100 MG tablet Commonly known as: ADOXA     TAKE these medications   acetaminophen 500 MG tablet Commonly known as: TYLENOL Take 1,000 mg by mouth every 6 (six) hours as needed for headache (pain).   azelastine 0.1 % nasal spray Commonly known as: ASTELIN Place 2 sprays into both nostrils 2 (two) times daily. Use in each nostril as directed What changed:   when to take this  reasons to take this   blood glucose meter kit and supplies Kit Dispense based on patient and insurance preference. Use up to four times daily as directed. (FOR ICD-9 250.00, 250.01).   Blood Glucose Monitoring Suppl Devi 1 each by Does not apply route 2 (two) times daily for 1 day.   cyclobenzaprine 10 MG tablet Commonly known as: FLEXERIL Take 1 tablet (10 mg total) by mouth 3 (three) times daily as needed for muscle spasms. What changed:   when to take this  reasons to take this   esomeprazole 40 MG capsule Commonly known as: NexIUM Take 1 capsule (40 mg total) by mouth 1 day or 1 dose. What changed:   when to take this  reasons to take this   guaiFENesin 600 MG 12 hr tablet Commonly known as: MUCINEX Take 600 mg by mouth 2 (two) times daily as needed for cough or to loosen phlegm.   Insulin Detemir 100 UNIT/ML Pen Commonly known as: LEVEMIR Inject 15 Units into the skin 2 (two) times daily.   Insulin Pen Needle 29G X 5MM Misc 1 application by Does not apply route 2 (two) times daily.   metFORMIN 500 MG tablet Commonly known as: GLUCOPHAGE Take 1 tablet (500 mg total) by mouth 2 (two) times daily with a meal.   nadolol 40 MG tablet Commonly known as: CORGARD TAKE 1 TABLET (40 MG TOTAL) BY MOUTH DAILY What changed:   how much to take  how to take this  when to take this  additional  instructions   potassium & sodium phosphates 280-160-250 MG Pack Commonly known as: PHOS-NAK Take 1 packet by mouth 4 (four) times daily -  with meals and at bedtime for 7 days.   potassium chloride SA 20 MEQ tablet Commonly known as: K-DUR Take 1 tablet (20 mEq total) by mouth daily for 7 days.   triamterene-hydrochlorothiazide 37.5-25 MG tablet Commonly known as: MAXZIDE-25 Take 1 tablet by mouth daily.       Allergies  Allergen Reactions  . Codeine Nausea And Vomiting  . Erythromycin Nausea And Vomiting  . Ketek [Telithromycin] Nausea And Vomiting  . Oxycodone-Acetaminophen Nausea And Vomiting  . Penicillins Nausea And Vomiting    Did it involve swelling of the face/tongue/throat, SOB, or low BP? No Did it involve sudden or severe rash/hives, skin peeling, or any reaction on the inside of your mouth or nose? No Did you need to seek medical attention at a hospital or  doctor's office? No When did it last happen?teenager If all above answers are "NO", may proceed with cephalosporin use.  Marland Kitchen Propoxyphene N-Acetaminophen Nausea And Vomiting  . Sulfonamide Derivatives Nausea And Vomiting  . Vicodin [Hydrocodone-Acetaminophen] Nausea And Vomiting    Consultations:  Diabetic educator   Procedures/Studies: Ct Abdomen Pelvis Wo Contrast  Result Date: 06/29/2019 CLINICAL DATA:  Nausea and vomiting for 1 week. EXAM: CT ABDOMEN AND PELVIS WITHOUT CONTRAST TECHNIQUE: Multidetector CT imaging of the abdomen and pelvis was performed following the standard protocol without IV contrast. COMPARISON:  June 25, 2019 FINDINGS: Lower chest: No acute abnormality. Hepatobiliary: Nodular contour of liver is identified. Mild heterogeneous density of the liver is identified. The gallbladder is normal. The biliary tree is normal. Pancreas: Unremarkable. No pancreatic ductal dilatation or surrounding inflammatory changes. Spleen: Normal in size without focal abnormality. Adrenals/Urinary Tract:  Adrenal glands are unremarkable. Kidneys are normal, without renal calculi, focal lesion, or hydronephrosis. Bladder is unremarkable. Stomach/Bowel: Stomach is within normal limits. Appendix appears normal. No evidence of bowel wall thickening, distention, or inflammatory changes. Vascular/Lymphatic: Aortic atherosclerosis. No enlarged abdominal or pelvic lymph nodes. Reproductive: Status post hysterectomy. No adnexal masses. Other: None Musculoskeletal: No acute abnormality is noted. IMPRESSION: No acute abnormality identified in the abdomen and pelvis. No bowel obstruction or free air. Cirrhosis of liver. Parenchymal heterogeneity is identified unchanged compared prior CT. If further evaluation is desired, MR abdomen with and without contrast is recommended. Electronically Signed   By: Abelardo Diesel M.D.   On: 06/29/2019 17:12   Ct Virtual Colonoscopy Screening  Result Date: 06/25/2019 CLINICAL DATA:  Cirrhosis with esophageal varices. Colon cancer screening. EXAM: CT VIRTUAL COLONOSCOPY SCREENING TECHNIQUE: The patient was given a standard Lo so bowel preparation with Gastrografin and barium for fluid and stool tagging respectively. The quality of the bowel preparation is moderate to poor with a fair amount of retained fluid. Automated CO2 insufflation of the colon was performed prior to image acquisition and colonic distention is moderate in the sigmoid colon, otherwise excellent. Image post processing was used to generate a 3D endoluminal fly-through projection of the colon and to electronically subtract stool/fluid as appropriate. COMPARISON:  CT abdomen 11/24/2009 FINDINGS: VIRTUAL COLONOSCOPY Sigmoid colon is suboptimally distended and filled with fluid. Fair amount of retained fluid throughout the colon. Otherwise, no definite polyp, mass or stricture. Virtual colonoscopy is not designed to detect diminutive polyps (i.e., less than or equal to 5 mm), the presence or absence of which may not affect  clinical management. CT ABDOMEN AND PELVIS WITHOUT CONTRAST Lower chest: Lung bases are clear. Heart size normal. No pericardial or pleural effusion. Hepatobiliary: Liver margin is irregular. Liver parenchyma is mildly heterogeneous. Gallbladder is unremarkable. No biliary ductal dilatation. Pancreas: Negative. Spleen: Negative. Adrenals/Urinary Tract: Adrenal glands and kidneys are unremarkable. Ureters are decompressed. Bladder is grossly unremarkable. Stomach/Bowel: Stomach, small bowel and appendix are unremarkable. Colon is discussed in the virtual colonoscopy section of this report. Vascular/Lymphatic: Atherosclerotic calcification of the aorta without aneurysm. Recanalized paraumbilical vein. No pathologically enlarged lymph nodes. Reproductive: Hysterectomy.  No adnexal mass. Other: No free fluid.  Mesenteries and peritoneum are unremarkable. Musculoskeletal: No worrisome lytic or sclerotic lesions. IMPRESSION: 1. Given limitations cited above, no polyp, mass or stricture. 2. Cirrhosis. Parenchymal heterogeneity may be due to fat deposition. If further evaluation is desired, MR abdomen without and with contrast is recommended. 3.  Aortic atherosclerosis (ICD10-170.0). Electronically Signed   By: Lorin Picket M.D.   On: 06/25/2019 11:10  US Abdomen Limited Ruq  Result Date: 06/15/2019 CLINICAL DATA:  Hepatic cirrhosis. EXAM: ULTRASOUND ABDOMEN LIMITED RIGHT UPPER QUADRANT COMPARISON:  CT abdomen and pelvis November 24, 2009 FINDINGS: Gallbladder: No gallstones or wall thickening visualized. There is no pericholecystic fluid. No sonographic Murphy sign noted by sonographer. Common bile duct: Diameter: 2 mm. No intrahepatic or extrahepatic biliary duct dilatation. Liver: No focal lesion identified. The liver has an inhomogeneous echotexture pattern with an overall increase in liver echogenicity. Areas of liver contour are somewhat nodular consistent with known cirrhosis. Portal vein is patent on color  Doppler imaging with normal direction of blood flow towards the liver. IMPRESSION: The appearance of the liver is indicative known cirrhosis. No focal liver lesions are evident. It must be cautioned that the sensitivity of ultrasound for detection of focal liver lesions is diminished considerably in this circumstance. Heterogeneous echotexture potentially could indicate underlying dysplastic type change. If further evaluation is felt to be warranted from an imaging standpoint, MR or CT pre and post-contrast could be helpful to further evaluate. Study otherwise unremarkable. Electronically Signed   By: Lowella Grip III M.D.   On: 06/15/2019 11:47     Subjective: Patient seen and examined on the day of discharge.  She is without symptoms.  She was little nervous about new diagnosis of diabetes after completely improving few years ago.  Ambulated in the hallway.   Discharge Exam: Vitals:   07/02/19 1032 07/02/19 1225  BP: (!) 89/56 (!) 102/50  Pulse: 66 72  Resp: 17 20  Temp: 98 F (36.7 C) 98.2 F (36.8 C)  SpO2: 100% 100%   Vitals:   07/02/19 0411 07/02/19 1032 07/02/19 1032 07/02/19 1225  BP: 103/73 92/61 (!) 89/56 (!) 102/50  Pulse: 65 66 66 72  Resp: _0 Temp: 98.2 F (36.8 C) 98 F (36.7 C) 98 F (36.7 C) 98.2 F (36.8 C)  TempSrc: Oral Oral Oral Oral  SpO2: 100% 100% 100% 100%  Weight:      Height:        General: Pt is alert, awake, not in acute distress Cardiovascular: RRR, S1/S2 +, no rubs, no gallops Respiratory: CTA bilaterally, no wheezing, no rhonchi Abdominal: Soft, NT, ND, bowel sounds + Extremities: no edema, no cyanosis    The results of significant diagnostics from this hospitalization (including imaging, microbiology, ancillary and laboratory) are listed below for reference.     Microbiology: Recent Results (from the past 240 hour(s))  SARS Coronavirus 2 [facility] order, Performed in [facility] hospital lab)     Status: None   Collection  Time: 06/29/19  6:40 PM  Result Value Ref Range Status   SARS Coronavirus 2 NEGATIVE NEGATIVE Final    Comment: (NOTE) If result is NEGATIVE SARS-CoV-2 target nucleic acids are NOT DETECTED. The SARS-CoV-2 RNA is generally detectable in upper and lower  respiratory specimens during the acute phase of infection. The lowest  concentration of SARS-CoV-2 viral copies this assay can detect is 250  copies / mL. A negative result does not preclude SARS-CoV-2 infection  and should not be used as the sole basis for treatment or other  patient management decisions.  A negative result may occur with  improper specimen collection / handling, submission of specimen other  than nasopharyngeal swab, presence of viral mutation(s) within the  areas targeted by this assay, and inadequate number of viral copies  (<250 copies / mL). A negative result must be combined with clinical  observations, patient  history, and epidemiological information. If result is POSITIVE SARS-CoV-2 target nucleic acids are DETECTED. The SARS-CoV-2 RNA is generally detectable in upper and lower  respiratory specimens dur ing the acute phase of infection.  Positive  results are indicative of active infection with SARS-CoV-2.  Clinical  correlation with patient history and other diagnostic information is  necessary to determine patient infection status.  Positive results do  not rule out bacterial infection or co-infection with other viruses. If result is PRESUMPTIVE POSTIVE SARS-CoV-2 nucleic acids MAY BE PRESENT.   A presumptive positive result was obtained on the submitted specimen  and confirmed on repeat testing.  While 2019 novel coronavirus  (SARS-CoV-2) nucleic acids may be present in the submitted sample  additional confirmatory testing may be necessary for epidemiological  and / or clinical management purposes  to differentiate between  SARS-CoV-2 and other Sarbecovirus currently known to infect humans.  If  clinically indicated additional testing with an alternate test  methodology [phone]) is advised. The SARS-CoV-2 RNA is generally  detectable in upper and lower respiratory sp ecimens during the acute  phase of infection. The expected result is Negative. Fact Sheet for Patients:  StrictlyIdeas.no Fact Sheet for Healthcare Providers: BankingDealers.co.za This test is not yet approved or cleared by the [place] FDA and has been authorized for detection and/or diagnosis of SARS-CoV-2 by FDA under an Emergency Use Authorization (EUA).  This EUA will remain in effect (meaning this test can be used) for the duration of the COVID-19 declaration under Section 564(b)(1) of the Act, 21 U.S.C. section 360bbb-3(b)(1), unless the authorization is terminated or revoked sooner. Performed at [facility], [place] [address], [place], [place] [address]      Labs: BNP (last 3 results) No results for input(s): BNP in the last 8760 hours. Basic Metabolic Panel: Recent Labs  Lab 06/29/19 1521 06/29/19 1646 06/29/19 2340 06/30/19 0404 07/01/19 0538 07/02/19 0857  NA 121* 120* 130* 134* 137 137  K 4.5 5.0 3.6 3.3* 2.6* 3.7  CL 79*  --  92* 97* 99 107  CO2 25  --  _0 GLUCOSE 857*  --  447* 251* 125* 250*  BUN 57*  --  56* 53* 40* 27*  CREATININE 2.98*  --  2.47* 2.13* 1.80* 1.44*  CALCIUM 10.6*  --  10.0 9.6 8.9 8.3*  MG  --   --   --   --  2.0 1.7  PHOS  --   --   --   --  2.0* 1.8*   Liver Function Tests: Recent Labs  Lab 06/29/19 1521  AST 53*  ALT 36  ALKPHOS 173*  BILITOT 2.7*  PROT 8.7*  ALBUMIN 3.7   Recent Labs  Lab 06/29/19 1521  LIPASE 443*   No results for input(s): AMMONIA in the last 168 hours. CBC: Recent Labs  Lab 06/29/19 1521 06/29/19 1646 06/30/19 0404 07/01/19 0538  WBC 8.8  --  9.7 6.5  NEUTROABS  --   --   --  3.4  HGB 18.3* 19.0* 16.4* 14.8  HCT 51.3* 56.0* 45.5 43.1  MCV 86.8  --   85.7 89.2  PLT 250  --  197 156   Cardiac Enzymes: No results for input(s): CKTOTAL, CKMB, CKMBINDEX, TROPONINI in the last 168 hours. BNP: Invalid input(s): POCBNP CBG: Recent Labs  Lab 07/01/19 1956 07/01/19 2333 07/02/19 0407 07/02/19 0727 07/02/19 1142  GLUCAP 349* 238* 95 87 214*   D-Dimer No results for  input(s): DDIMER in the last 72 hours. Hgb A1c Recent Labs    06/30/19 0404 07/01/19 0538  HGBA1C >15.5* 15.3*   Lipid Profile No results for input(s): CHOL, HDL, LDLCALC, TRIG, CHOLHDL, LDLDIRECT in the last 72 hours. Thyroid function studies No results for input(s): TSH, T4TOTAL, T3FREE, THYROIDAB in the last 72 hours.  Invalid input(s): FREET3 Anemia work up No results for input(s): VITAMINB12, FOLATE, FERRITIN, TIBC, IRON, RETICCTPCT in the last 72 hours. Urinalysis    Component Value Date/Time   COLORURINE YELLOW 06/29/2019 2018   APPEARANCEUR CLEAR 06/29/2019 2018   LABSPEC 1.023 06/29/2019 2018   PHURINE 5.0 06/29/2019 2018   GLUCOSEU >=500 (A) 06/29/2019 2018   HGBUR SMALL (A) 06/29/2019 2018   [place] NEGATIVE 06/29/2019 2018   BILIRUBINUR neg 04/08/2015 1304   [place] 06/29/2019 2018   PROTEINUR NEGATIVE 06/29/2019 2018   UROBILINOGEN 0.2 06/11/2015 1802   NITRITE NEGATIVE 06/29/2019 2018   LEUKOCYTESUR SMALL (A) 06/29/2019 2018   Sepsis Labs Invalid input(s): PROCALCITONIN,  WBC,  LACTICIDVEN Microbiology Recent Results (from the past 240 hour(s))  SARS Coronavirus 2 [facility] order, Performed in [place] hospital lab)     Status: None   Collection Time: 06/29/19  6:40 PM  Result Value Ref Range Status   SARS Coronavirus 2 NEGATIVE NEGATIVE Final    Comment: (NOTE) If result is NEGATIVE SARS-CoV-2 target nucleic acids are NOT DETECTED. The SARS-CoV-2 RNA is generally detectable in upper and lower  respiratory specimens during the acute phase of infection. The lowest  concentration of SARS-CoV-2 viral copies this assay  can detect is 250  copies / mL. A negative result does not preclude SARS-CoV-2 infection  and should not be used as the sole basis for treatment or other  patient management decisions.  A negative result may occur with  improper specimen collection / handling, submission of specimen other  than nasopharyngeal swab, presence of viral mutation(s) within the  areas targeted by this assay, and inadequate number of viral copies  (<250 copies / mL). A negative result must be combined with clinical  observations, patient history, and epidemiological information. If result is POSITIVE SARS-CoV-2 target nucleic acids are DETECTED. The SARS-CoV-2 RNA is generally detectable in upper and lower  respiratory specimens dur ing the acute phase of infection.  Positive  results are indicative of active infection with SARS-CoV-2.  Clinical  correlation with patient history and other diagnostic information is  necessary to determine patient infection status.  Positive results do  not rule out bacterial infection or co-infection with other viruses. If result is PRESUMPTIVE POSTIVE SARS-CoV-2 nucleic acids MAY BE PRESENT.   A presumptive positive result was obtained on the submitted specimen  and confirmed on repeat testing.  While 2019 novel coronavirus  (SARS-CoV-2) nucleic acids may be present in the submitted sample  additional confirmatory testing may be necessary for epidemiological  and / or clinical management purposes  to differentiate between  SARS-CoV-2 and other Sarbecovirus currently known to infect humans.  If clinically indicated additional testing with an alternate test  methodology [phone]) is advised. The SARS-CoV-2 RNA is generally  detectable in upper and lower respiratory sp ecimens during the acute  phase of infection. The expected result is Negative. Fact Sheet for Patients:  StrictlyIdeas.no Fact Sheet for Healthcare  Providers: BankingDealers.co.za This test is not yet approved or cleared by the [place] FDA and has been authorized for detection and/or diagnosis of SARS-CoV-2 by FDA under an Emergency Use Authorization (EUA).  This EUA will remain in effect (meaning this test can be used) for the duration of the COVID-19 declaration under Section 564(b)(1) of the Act, 21 U.S.C. section 360bbb-3(b)(1), unless the authorization is terminated or revoked sooner. Performed at [facility], [place] [address], [place], [place] [address]      Time coordinating discharge: 35 minutes  SIGNED:   Barb Merino, MD  [facility] 07/02/2019, 1:24 PM

## 2019-07-11 LAB — HM DIABETES EYE EXAM

## 2019-07-26 ENCOUNTER — Other Ambulatory Visit: Payer: Self-pay

## 2019-07-30 ENCOUNTER — Telehealth: Payer: Self-pay | Admitting: Internal Medicine

## 2019-07-30 ENCOUNTER — Ambulatory Visit: Payer: 59 | Admitting: Internal Medicine

## 2019-07-30 ENCOUNTER — Other Ambulatory Visit: Payer: Self-pay

## 2019-07-30 ENCOUNTER — Encounter: Payer: Self-pay | Admitting: Internal Medicine

## 2019-07-30 VITALS — BP 120/60 | HR 68 | Ht 62.5 in | Wt 220.0 lb

## 2019-07-30 DIAGNOSIS — E669 Obesity, unspecified: Secondary | ICD-10-CM

## 2019-07-30 DIAGNOSIS — E1165 Type 2 diabetes mellitus with hyperglycemia: Secondary | ICD-10-CM

## 2019-07-30 DIAGNOSIS — Z794 Long term (current) use of insulin: Secondary | ICD-10-CM

## 2019-07-30 MED ORDER — INSULIN PEN NEEDLE 32G X 4 MM MISC: 3 refills | Status: DC

## 2019-07-30 MED ORDER — LANTUS SOLOSTAR 100 UNIT/ML ~~LOC~~ SOPN: 15.0000 [IU] | PEN_INJECTOR | Freq: Every day | SUBCUTANEOUS | 11 refills | Status: DC

## 2019-07-30 MED ORDER — METFORMIN HCL 500 MG PO TABS: 500.0000 mg | ORAL_TABLET | Freq: Two times a day (BID) | ORAL | 3 refills | Status: DC

## 2019-07-30 NOTE — Patient Instructions (Signed)
Please decrease: - Lantus  to 15 units at bedtime  Please continue: - Metformin 500 mg twice a day  Please stop at the lab.  Check sugars once a day, rotating check times.  Please come back for a follow-up appointment in 3-4 months.

## 2019-07-30 NOTE — Progress Notes (Addendum)
Patient ID: Melissa [name], female   DOB: 1961-06-19, 58 y.o.   MRN: [id number]  HPI: Melissa [name] is a 58 y.o.-year-old female,  Returning for follow-up for DM2, dx in 06/11/2015 (steroid-induced - for purpura).  Last visit 3 years and 1 month ago.  Patient was diagnosed with type 2 diabetes (after steroids for purpura) in 05/2015, after which she was started on basal-bolus insulin regimen.  Afterwards, due to the improvement in her blood sugars she was able to be taken off insulin and remained only on low-dose metformin.    However, she stopped checking blood sugars after our last appointment. She had a colonoscopy 05/2019 >> felt very poorly during the prep: polydipsia/polyuria and significant for some fatigue.  After t initial to feel poorly and was eventually admitted for DKA last month.   Insulin was restarted and she was referred back to endocrinology. Sugars improved since then.  Last hemoglobin A1c was: Lab Results  Component Value Date   HGBA1C 15.3 (H) 07/01/2019   HGBA1C >15.5 (H) 06/30/2019   HGBA1C 5.2 06/18/2016   Pt is on a regimen of: - Metformin 500 mg 2x a day - Levemir 15 units 2x a day  Pt checks her sugars 3x a day per review of her meter download: - am: 77, 84-103, 132 - 2h after b'fast: 95, 115 - lunch: 63-111 - 2h after lunch: 96 - dinner: 86-100 - 2h after dinner: 77-125 - bedtime: n/c Lowest: 63 Highest: 300s at hosp d/c  Glucometer: Free Style >> AccuChek guide  -+ CKD, last BUN/creatinine:  Lab Results  Component Value Date   BUN 27 (H) 07/02/2019   CREATININE 1.44 (H) 07/02/2019  She is not on an ACE inhibitor or ARB. - last set of lipids: Lab Results  Component Value Date   CHOL 178 09/27/2017   HDL 65 09/27/2017   LDLCALC 99 09/27/2017   TRIG 70 09/27/2017   CHOLHDL 2.7 09/27/2017  She is not on a statin. - last eye exam was in 2020: No DR -No numbness and tingling in her feet.   ROS: Constitutional: no weight gain/no  weight loss, no fatigue, no subjective hyperthermia, no subjective hypothermia Eyes: no blurry vision, no xerophthalmia ENT: no sore throat, no nodules palpated in neck, no dysphagia, no odynophagia, no hoarseness Cardiovascular: no CP/no SOB/no palpitations/no leg swelling Respiratory: no cough/no SOB/no wheezing Gastrointestinal: no N/no V/no D/no C/no acid reflux Musculoskeletal: no muscle aches/no joint aches Skin: no rashes, no hair loss, + dry skin Neurological: no tremors/no numbness/no tingling/no dizziness  I reviewed pt's medications, allergies, PMH, social hx, family hx, and changes were documented in the history of present illness. Otherwise, unchanged from my initial visit note.  Past Medical History:  Diagnosis Date  . Acute renal failure ([place]) 06/11/2015  . Allergy    sinus allergies  . Chronic kidney disease (CKD), stage III (moderate) ([place])    Archie Endo 06/11/2015  . Diabetes mellitus without complication ([place]) dx'd 5/73/2202   Archie Endo 06/11/2015  . Esophageal varices ([place])   . GERD (gastroesophageal reflux disease)   . Heart murmur   . Hypertension    Past Surgical History:  Procedure Laterality Date  . ABDOMINAL HYSTERECTOMY  1997   "partial"  . CARPAL TUNNEL RELEASE Right 1987  . CESAREAN SECTION  1979  . FOOT SURGERY Bilateral 2018  . REDUCTION MAMMAPLASTY Bilateral 2005  . TUBAL LIGATION  1981   Social History   Social History  . Marital Status: Married  Spouse Name: Melissa [name], Melissa [name]  . Number of Children: 1  . Years of Education: 14+   Occupational History  . 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 Smoking status: Never Smoker   . Smokeless tobacco: Never Used  . Alcohol Use: No  . Drug Use: No  . Sexual Activity:    Partners: Male    Birth Control/ Protection: Surgical   Social History Narrative   Lives with her husband and two dogs. Her son is a Engineer, agricultural in [place], [place]. She feels  like the grandmother to her sister's 2 children, since the sister's sudden and unexpected death in July 21, 2014.   Current Outpatient Medications on File Prior to Visit  Medication Sig Dispense Refill  . acetaminophen (TYLENOL) 500 MG tablet Take 1,000 mg by mouth every 6 (six) hours as needed for headache (pain).    Marland Kitchen azelastine (ASTELIN) 0.1 % nasal spray Place 2 sprays into both nostrils 2 (two) times daily. Use in each nostril as directed (Patient taking differently: Place 2 sprays into both nostrils 2 (two) times daily as needed for rhinitis or allergies. Use in each nostril as directed) 30 mL 0  . blood glucose meter kit and supplies KIT Dispense based on patient and insurance preference. Use up to four times daily as directed. (FOR ICD-9 250.00, 250.01). 1 each 0  . Blood Glucose Monitoring Suppl DEVI 1 each by Does not apply route 2 (two) times daily for 1 day. 1 each o  . cyclobenzaprine (FLEXERIL) 10 MG tablet Take 1 tablet (10 mg total) by mouth 3 (three) times daily as needed for muscle spasms. (Patient taking differently: Take 10 mg by mouth at bedtime as needed for muscle spasms (back pain). ) 30 tablet 0  . esomeprazole (NEXIUM) 40 MG capsule Take 1 capsule (40 mg total) by mouth 1 day or 1 dose. (Patient taking differently: Take 40 mg by mouth daily as needed (acid reflux/heartburn). ) 90 capsule 3  . guaiFENesin (MUCINEX) 600 MG 12 hr tablet Take 600 mg by mouth 2 (two) times daily as needed for cough or to loosen phlegm.     . Insulin Detemir (LEVEMIR) 100 UNIT/ML Pen Inject 15 Units into the skin 2 (two) times daily. 15 mL 11  . Insulin Pen Needle 29G X 5MM MISC 1 application by Does not apply route 2 (two) times daily. 100 each 0  . metFORMIN (GLUCOPHAGE) 500 MG tablet Take 1 tablet (500 mg total) by mouth 2 (two) times daily with a meal. 60 tablet 0  . nadolol (CORGARD) 40 MG tablet TAKE 1 TABLET (40 MG TOTAL) BY MOUTH DAILY (Patient taking differently: Take 40 mg by mouth daily. ) 90  tablet 3  . potassium chloride SA (K-DUR) 20 MEQ tablet Take 1 tablet (20 mEq total) by mouth daily for 7 days. 7 tablet 0  . triamterene-hydrochlorothiazide (MAXZIDE-25) 37.5-25 MG tablet Take 1 tablet by mouth daily. 90 tablet 3   No current facility-administered medications on file prior to visit.    Allergies  Allergen Reactions  . Codeine Nausea And Vomiting  . Erythromycin Nausea And Vomiting  . Ketek [Telithromycin] Nausea And Vomiting  . Oxycodone-Acetaminophen Nausea And Vomiting  . Penicillins Nausea And Vomiting    Did it involve swelling of the face/tongue/throat, SOB, or low BP? No Did it involve sudden or severe rash/hives, skin peeling, or any reaction on the inside of your mouth  or nose? No Did you need to seek medical attention at a hospital or doctor's office? No When did it last happen?teenager If all above answers are "NO", may proceed with cephalosporin use.  Marland Kitchen Propoxyphene N-Acetaminophen Nausea And Vomiting  . Sulfonamide Derivatives Nausea And Vomiting  . Vicodin [Hydrocodone-Acetaminophen] Nausea And Vomiting   Family History  Problem Relation Age of Onset  . Hypertension Mother   . Hypertension Brother   . Kidney disease Brother        ESRD on Dialysis  . Diabetes Brother   . CAD Sister   . Colon cancer Neg Hx   . Esophageal cancer Neg Hx   . Rectal cancer Neg Hx   . Stomach cancer Neg Hx    PE: BP 120/60   Pulse 68   Ht 5' 2.5" (1.588 m)   Wt 220 lb (99.8 kg)   SpO2 98%   BMI 39.60 kg/m  Body mass index is 39.6 kg/m. Wt Readings from Last 3 Encounters:  07/30/19 220 lb (99.8 kg)  07/01/19 224 lb 13.9 oz (102 kg)  06/08/19 230 lb (104.3 kg)   Constitutional: overweight, in NAD Eyes: PERRLA, EOMI, no exophthalmos ENT: moist mucous membranes, no thyromegaly, no cervical lymphadenopathy Cardiovascular: RRR, No MRG Respiratory: CTA B Gastrointestinal: abdomen soft, NT, ND, BS+ Musculoskeletal: no deformities, strength intact in all  4 Skin: moist, warm, no rashes Neurological: no tremor with outstretched hands, DTR normal in all 4  ASSESSMENT: 1. DM2, uncontrolled, insulin-dependent, with hyperglycemia  2.  Obesity  PLAN:  1. Patient with history of steroid-induced diabetes, in remission at last visit, which was more than 3 years ago. At that time, she was on a minimum metformin dose.  Sugars were all lower than 100.  No steroid use since then but she stopped checking sugars and she gained almost 20 pounds.   She started to feel poorly during her colonoscopy prep and was finally admitted in DKA 06/19/2019.  An HbA1c checked again was undetectably high and a repeat obtained the next day was 15.3%. She is currently on Lantus and a higher dose of metformin with precipitous improvement in control.  In fact, she started to develop low blood sugars in the 60s.  We will need to reduce the Lantus dose and I advised him to decrease this to only once a day.  We will continue metformin. -In the meantime, I think we need to check her for type 1 diabetes to see if she needs to continue on insulin.  We discussed that if she does not have insulin deficiency, we may be able to stop insulin in the future but she absolutely needs to continue to check her sugars consistently from now on.  Also, weight loss will help decrease her insulin resistance. - I suggested to:  Patient Instructions  Please decrease: - Lantus  to 15 units at bedtime  Please continue: - Metformin 500 mg twice a day  Please stop at the lab.  Check sugars once a day, rotating check times.  Please come back for a follow-up appointment in 3-4 months.  - advised to check sugars at different times of the day - 1x a day, rotating check times - given foot care handout and explained the principles   - given instructions for hypoglycemia management "15-15 rule"  - advised for yearly eye exams  - Return to clinic in 3 mo with sugar log   2.  Obesity -Patient gained almost  20 pounds since her last  visit in 2017, but lost 10 pounds in the last month due to being sick -We will decrease her insulin dose today which should also help with weight loss  Component     Latest Ref Rng & Units 07/30/2019  Glucose, Plasma     65 - 99 mg/dL 95  C-Peptide     0.80 - 3.85 ng/mL 2.10  Islet Cell Ab     Neg:<1:1 Negative  ZNT8 Antibodies     U/mL <15  Glutamic Acid Decarb Ab     <5 IU/mL <5  No signs of type 1 diabetes.  Philemon Kingdom, MD PhD [facility] Endocrinology

## 2019-07-30 NOTE — Telephone Encounter (Signed)
MEDICATION: test strips  PHARMACY:  [facility]  IS THIS A 90 DAY SUPPLY : y  IS PATIENT OUT OF MEDICATION: 3 strips left  IF NOT; HOW MUCH IS LEFT:   LAST APPOINTMENT DATE: @8 /31/2020  NEXT APPOINTMENT DATE:@11 /30/2020  DO WE HAVE YOUR PERMISSION TO LEAVE A DETAILED MESSAGE:  OTHER COMMENTS:    **Let patient know to contact pharmacy at the end of the day to make sure medication is ready. **  ** Please notify patient to allow 48-72 hours to process**  **Encourage patient to contact the pharmacy for refills or they can request refills through [facility]**

## 2019-07-31 MED ORDER — ACCU-CHEK GUIDE VI STRP: ORAL_STRIP | 12 refills | Status: AC

## 2019-07-31 NOTE — Addendum Note (Signed)
Addended by: Cardell Peach I on: 07/31/2019 01:34 PM   Modules accepted: Orders

## 2019-07-31 NOTE — Telephone Encounter (Signed)
RX has been sent.

## 2019-08-02 LAB — C-PEPTIDE: C-Peptide: 2.1 ng/mL (ref 0.80–3.85)

## 2019-08-02 LAB — GLUCOSE, FASTING: Glucose, Plasma: 95 mg/dL (ref 65–99)

## 2019-08-02 LAB — GLUTAMIC ACID DECARBOXYLASE AUTO ABS: Glutamic Acid Decarb Ab: <5 IU/mL (ref <5)

## 2019-08-13 ENCOUNTER — Other Ambulatory Visit: Payer: Self-pay | Admitting: Physician Assistant

## 2019-08-13 DIAGNOSIS — Z1231 Encounter for screening mammogram for malignant neoplasm of breast: Secondary | ICD-10-CM

## 2019-08-13 LAB — ANTI-ISLET CELL ANTIBODY: Islet Cell Ab: NEGATIVE

## 2019-08-13 LAB — ZNT8 ANTIBODIES: ZNT8 Antibodies: <15 U/mL

## 2019-08-15 ENCOUNTER — Encounter: Payer: Self-pay | Admitting: Internal Medicine

## 2019-08-15 NOTE — Telephone Encounter (Signed)
A1C was 15.3 on 07/01/19. Before responding to this pt, I wanted to make sure that you didn't want to repeat and to ensure you did not have any other advice for this pt.

## 2019-08-22 ENCOUNTER — Encounter: Payer: Self-pay | Admitting: Internal Medicine

## 2019-08-23 ENCOUNTER — Encounter: Payer: Self-pay | Admitting: Internal Medicine

## 2019-08-23 NOTE — Progress Notes (Signed)
Received labs from PCP drawn on 08/22/2019: CMP normal with glucose 92, however, BUN/creatinine 27/1.42, GFR 46,  Sodium 146, otherwise normal CMP, including alkaline phosphatase of 106  07/06/2019: CMP normal with exception of glucose 131.  BUN/creatinine 11/1.17, GFR 59.  AST/ALT 53/35, both elevated.  Alkaline phosphatase 179, elevated.  Albumin low, at 3.2.  06/29/2019:  Glucose 500

## 2019-09-17 ENCOUNTER — Telehealth: Payer: Self-pay

## 2019-09-17 ENCOUNTER — Other Ambulatory Visit: Payer: Self-pay

## 2019-09-17 DIAGNOSIS — K7460 Unspecified cirrhosis of liver: Secondary | ICD-10-CM

## 2019-09-17 DIAGNOSIS — R188 Other ascites: Secondary | ICD-10-CM

## 2019-09-17 NOTE — Telephone Encounter (Signed)
-----   Message from Hughie Closs, RN sent at 06/20/2019  2:30 PM EDT ----- Put order in Epic for AFD and call pt. To come in for lab. 3 month F/U = 09/19/19

## 2019-09-17 NOTE — Telephone Encounter (Signed)
Called patient and left voice mail to please come in for 3 month f/u labs in our basement Mon-Fri between 7:30am-4:30pm. And to please call back if any questions

## 2019-09-17 NOTE — Progress Notes (Signed)
afp  

## 2019-09-26 ENCOUNTER — Other Ambulatory Visit: Payer: Self-pay

## 2019-09-26 ENCOUNTER — Ambulatory Visit
Admission: RE | Admit: 2019-09-26 | Discharge: 2019-09-26 | Disposition: A | Discharge status: Home / Self Care | Payer: 59 | Source: Ambulatory Visit | Attending: Physician Assistant | Admitting: Physician Assistant

## 2019-09-26 DIAGNOSIS — Z1231 Encounter for screening mammogram for malignant neoplasm of breast: Secondary | ICD-10-CM

## 2019-10-29 ENCOUNTER — Ambulatory Visit (INDEPENDENT_AMBULATORY_CARE_PROVIDER_SITE_OTHER): Payer: 59 | Admitting: Internal Medicine

## 2019-10-29 ENCOUNTER — Encounter: Payer: Self-pay | Admitting: Internal Medicine

## 2019-10-29 ENCOUNTER — Other Ambulatory Visit: Payer: Self-pay

## 2019-10-29 DIAGNOSIS — E669 Obesity, unspecified: Secondary | ICD-10-CM | POA: Diagnosis not present

## 2019-10-29 DIAGNOSIS — N289 Disorder of kidney and ureter, unspecified: Secondary | ICD-10-CM

## 2019-10-29 DIAGNOSIS — E1165 Type 2 diabetes mellitus with hyperglycemia: Secondary | ICD-10-CM

## 2019-10-29 DIAGNOSIS — Z794 Long term (current) use of insulin: Secondary | ICD-10-CM | POA: Diagnosis not present

## 2019-10-29 NOTE — Patient Instructions (Addendum)
Please decrease: - Lantus to 10 units at bedtime  Let me know how the sugars are doing after the Holidays.  Please continue: - Metformin 500 mg 2x a day  Please come back for a follow-up appointment in 3 to 4 months.

## 2019-10-29 NOTE — Progress Notes (Signed)
Patient ID: Melissa [name], female   DOB: Oct 14, 1961, 58 y.o.   MRN: [id number]  Patient location: Home My location: Office  Referring Provider: [facility] [place]  I connected with the patient on 10/29/19 at  11:05 am EST by a video enabled telemedicine application and verified that I am speaking with the correct person.   I discussed the limitations of evaluation and management by telemedicine and the availability of in person appointments. The patient expressed understanding and agreed to proceed.   Details of the encounter are shown below.  HPI: Melissa [name] is a 58 y.o.-year-old female,  Returning for follow-up for DM2, dx in 06/11/2015 (steroid-induced - for purpura).  Last visit 3 months ago.  Reviewed and addended history: Patient was diagnosed with type 2 diabetes (after steroids for purpura) in 05/2015, after which she was started on basal-bolus insulin regimen.  Afterwards, due to the improvement in her blood sugars she was able to be taken off insulin and remained only on low-dose metformin.    However, she stopped checking blood sugars after our last appointment. She had a colonoscopy 05/2019 >> felt very poorly during the prep: polydipsia/polyuria and significant for some fatigue.  After t initial to feel poorly and was eventually admitted for DKA last month.   Insulin was restarted and she was referred back to endocrinology.  Sugars improved since then.  Reviewed HbA1c levels: Lab Results  Component Value Date   HGBA1C 15.3 (H) 07/01/2019   HGBA1C >15.5 (H) 06/30/2019   HGBA1C 5.2 06/18/2016   At last visit we ruled out type 1 diabetes: Component     Latest Ref Rng & Units 07/30/2019  Glucose, Plasma     65 - 99 mg/dL 95  C-Peptide     0.80 - 3.85 ng/mL 2.10  Islet Cell Ab     Neg:<1:1 Negative  ZNT8 Antibodies     U/mL <15  Glutamic Acid Decarb Ab     <5 IU/mL <5   She is on: - Metformin 500 mg 2x a day - Levemir 15 units  2x a day >> 15 units at bedtime  Pt checks her sugars 3 times a day: - am: 77, 84-103, 132 >> 68-94, 101 - 2h after b'fast: 95, 115 >> n/c - lunch: 63-111 >> n/c - 2h after lunch: 96 >> n/c - dinner: 86-100 - 2h after dinner: 77-125 >> 70-78 - bedtime: n/c Lowest: 63 >> 69 Highest: 300s at hosp d/c >> 101  Glucometer: Free Style >> AccuChek guide  Received labs from PCP drawn on 08/22/2019: CMP normal with glucose 92, however, BUN/creatinine 27/1.42, GFR 46,  Sodium 146, otherwise normal CMP, including alkaline phosphatase of 106  07/06/2019: CMP normal with exception of glucose 131.  BUN/creatinine 11/1.17, GFR 59.  AST/ALT 53/35, both elevated.  Alkaline phosphatase 179, elevated.  Albumin low, at 3.2.  06/29/2019:  Glucose 500  -+ CKD: Lab Results  Component Value Date   BUN 27 (H) 07/02/2019   CREATININE 1.44 (H) 07/02/2019  She is not on ACE inhibitor or ARB.  Reviewed lipid panels: Lab Results  Component Value Date   CHOL 178 09/27/2017   HDL 65 09/27/2017   LDLCALC 99 09/27/2017   TRIG 70 09/27/2017   CHOLHDL 2.7 09/27/2017  She is not on a statin. - last eye exam was in 2020: No DR -She denies numbness and tingling in her feet.   ROS: Constitutional: no weight gain/no weight loss, no fatigue, no  subjective hyperthermia, no subjective hypothermia Eyes: no blurry vision, no xerophthalmia ENT: no sore throat, no nodules palpated in neck, no dysphagia, no odynophagia, no hoarseness Cardiovascular: no CP/no SOB/no palpitations/no leg swelling Respiratory: no cough/no SOB/no wheezing Gastrointestinal: no N/no V/no D/no C/no acid reflux Musculoskeletal: no muscle aches/no joint aches Skin: no rashes, no hair loss Neurological: no tremors/no numbness/no tingling/no dizziness  I reviewed pt's medications, allergies, PMH, social hx, family hx, and changes were documented in the history of present illness. Otherwise, unchanged from my initial visit note.  Past Medical  History:  Diagnosis Date  . Acute renal failure ([place]) 06/11/2015  . Allergy    sinus allergies  . Chronic kidney disease (CKD), stage III (moderate)    Archie Endo 06/11/2015  . Diabetes mellitus without complication ([place]) dx'd 6/83/4196   Archie Endo 06/11/2015  . Esophageal varices ([place])   . GERD (gastroesophageal reflux disease)   . Heart murmur   . Hypertension    Past Surgical History:  Procedure Laterality Date  . ABDOMINAL HYSTERECTOMY  1997   "partial"  . CARPAL TUNNEL RELEASE Right 1987  . CESAREAN SECTION  1979  . FOOT SURGERY Bilateral 2018  . REDUCTION MAMMAPLASTY Bilateral 2005  . TUBAL LIGATION  1981   Social History   Social History  . Marital Status: Married    Spouse Name: Quanna Wittke, Brooke Bonito  . Number of Children: 1  . Years of Education: 14+   Occupational History  . 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 Smoking status: Never Smoker   . Smokeless tobacco: Never Used  . Alcohol Use: No  . Drug Use: No  . Sexual Activity:    Partners: Male    Birth Control/ Protection: Surgical   Social History Narrative   Lives with her husband and two dogs. Her son is a Engineer, agricultural in [place], [place]. She feels like the grandmother to her sister's 2 children, since the sister's sudden and unexpected death in 2014-06-24.   Current Outpatient Medications on File Prior to Visit  Medication Sig Dispense Refill  . acetaminophen (TYLENOL) 500 MG tablet Take 1,000 mg by mouth every 6 (six) hours as needed for headache (pain).    Marland Kitchen azelastine (ASTELIN) 0.1 % nasal spray Place 2 sprays into both nostrils 2 (two) times daily. Use in each nostril as directed (Patient taking differently: Place 2 sprays into both nostrils 2 (two) times daily as needed for rhinitis or allergies. Use in each nostril as directed) 30 mL 0  . blood glucose meter kit and supplies KIT Dispense based on patient and insurance preference. Use up to four times daily  as directed. (FOR ICD-9 250.00, 250.01). 1 each 0  . Blood Glucose Monitoring Suppl DEVI 1 each by Does not apply route 2 (two) times daily for 1 day. 1 each o  . cyclobenzaprine (FLEXERIL) 10 MG tablet Take 1 tablet (10 mg total) by mouth 3 (three) times daily as needed for muscle spasms. (Patient taking differently: Take 10 mg by mouth at bedtime as needed for muscle spasms (back pain). ) 30 tablet 0  . esomeprazole (NEXIUM) 40 MG capsule Take 1 capsule (40 mg total) by mouth 1 day or 1 dose. (Patient taking differently: Take 40 mg by mouth daily as needed (acid reflux/heartburn). ) 90 capsule 3  . glucose blood (ACCU-CHEK GUIDE) test strip Use to check blood sugar 3 times a day 300 each 12  .  guaiFENesin (MUCINEX) 600 MG 12 hr tablet Take 600 mg by mouth 2 (two) times daily as needed for cough or to loosen phlegm.     . Insulin Glargine (LANTUS SOLOSTAR) 100 UNIT/ML Solostar Pen Inject 15 Units into the skin at bedtime. 5 pen 11  . Insulin Pen Needle 32G X 4 MM MISC Use 1x a day 100 each 3  . metFORMIN (GLUCOPHAGE) 500 MG tablet Take 1 tablet (500 mg total) by mouth 2 (two) times daily with a meal. 180 tablet 3  . nadolol (CORGARD) 40 MG tablet TAKE 1 TABLET (40 MG TOTAL) BY MOUTH DAILY (Patient taking differently: Take 40 mg by mouth daily. ) 90 tablet 3  . potassium chloride SA (K-DUR) 20 MEQ tablet Take 1 tablet (20 mEq total) by mouth daily for 7 days. 7 tablet 0  . triamterene-hydrochlorothiazide (MAXZIDE-25) 37.5-25 MG tablet Take 1 tablet by mouth daily. 90 tablet 3   No current facility-administered medications on file prior to visit.    Allergies  Allergen Reactions  . Codeine Nausea And Vomiting  . Erythromycin Nausea And Vomiting  . Ketek [Telithromycin] Nausea And Vomiting  . Oxycodone-Acetaminophen Nausea And Vomiting  . Penicillins Nausea And Vomiting    Did it involve swelling of the face/tongue/throat, SOB, or low BP? No Did it involve sudden or severe rash/hives, skin  peeling, or any reaction on the inside of your mouth or nose? No Did you need to seek medical attention at a hospital or doctor's office? No When did it last happen?teenager If all above answers are "NO", may proceed with cephalosporin use.  Marland Kitchen Propoxyphene N-Acetaminophen Nausea And Vomiting  . Sulfonamide Derivatives Nausea And Vomiting  . Vicodin [Hydrocodone-Acetaminophen] Nausea And Vomiting   Family History  Problem Relation Age of Onset  . Hypertension Mother   . Hypertension Brother   . Kidney disease Brother        ESRD on Dialysis  . Diabetes Brother   . CAD Sister   . Colon cancer Neg Hx   . Esophageal cancer Neg Hx   . Rectal cancer Neg Hx   . Stomach cancer Neg Hx    PE: There were no vitals taken for this visit. There is no height or weight on file to calculate BMI. Wt Readings from Last 3 Encounters:  07/30/19 220 lb (99.8 kg)  07/01/19 224 lb 13.9 oz (102 kg)  06/08/19 230 lb (104.3 kg)   Constitutional:  in NAD  The physical exam was not performed (virtual visit).  ASSESSMENT: 1. DM2, uncontrolled, insulin-dependent, with hyperglycemia  2.  Obesity  3. A vs. CKD  PLAN:  1. Patient with history of steroid-induced diabetes, previously in remission, but exacerbated before last visit, when she was admitted with DKA (05/2019).  An HbA1c was undetectably high at that time and a repeat the next day was 15.3%.  She was started on Lantus and a high dose of Metformin with precipitous improvement in control.  In fact, she also started to develop low blood sugars in the 60s.  We reduced the Lantus at last visit and continued Metformin.  We checked her for type 1 diabetes and the investigation was negative.  We reviewed the labs today and I explained her the need. -At this visit, we reviewed her sugars in detail.  They remain very well controlled, in fact, excessively so, and she can have low blood sugars in the morning.  She is not trying to eat a snack at night so  that she does not drop her sugars too much overnight.  Therefore, we can reduce the Lantus dose further.  I advised her to continue to check her sugars and may try to reduce the Lantus dose, especially after the holidays.  We will continue the same dose of Metformin. - I suggested to:  Patient Instructions  Please decrease: - Lantus to 10 units at bedtime  Let me know how the sugars are doing after the Holidays.  Please continue: - Metformin 500 mg 2x a day  Please come back for a follow-up appointment in 3 to 4 months.  - we we will check an HbA1c when she returns to the clinic - advised to check sugars at different times of the day - 1x a day, rotating check times - advised for yearly eye exams >> she is UTD - return to clinic in 3-4 months  2.  Obesity -She gained almost 20 pounds since 2017 but lost 10 pounds before last visit -She is currently on insulin which is weight inducing, but we have tried to decrease the dose and maybe even stopping that NPH -In the future, we may need to add a GLP-1 receptor agonist or an SGLT2 inhibitor, which may help with weight loss  3. Acute vs. Chronic kidney disease -Most likely related to her extreme hypoglycemia over the summer versus chronic diabetes effect -Latest GFR was reviewed and this was 46 in 07/2019, previously 59. -We will need to recheck her kidney function at next visit, now that her sugars have improved  Philemon Kingdom, MD PhD [facility] Endocrinology

## 2019-12-17 ENCOUNTER — Other Ambulatory Visit: Payer: Self-pay

## 2019-12-17 ENCOUNTER — Telehealth: Payer: Self-pay

## 2019-12-17 DIAGNOSIS — K7460 Unspecified cirrhosis of liver: Secondary | ICD-10-CM

## 2019-12-17 NOTE — Telephone Encounter (Signed)
Patient called back and said she just had a lot of labs done at [facility]. She will have it faxed to [place]

## 2019-12-17 NOTE — Telephone Encounter (Signed)
-----   Message from Antony Blackbird, RN sent at 06/20/2019  2:31 PM EDT ----- Put order in Epic for LFTs, INR, CBC and call pt. To come in for 6 month F/U labs. = 12/21/19

## 2019-12-17 NOTE — Telephone Encounter (Signed)
Called patient and left message to please come into our lab for 6 month labs. Mon-Fri. Between 7:30am-4:30pm. Asked to please call if any questions about this

## 2020-01-01 ENCOUNTER — Other Ambulatory Visit: Payer: Self-pay

## 2020-01-01 ENCOUNTER — Telehealth: Payer: Self-pay

## 2020-01-01 NOTE — Telephone Encounter (Signed)
Called and LM for pt that she needs to go for lab work prior to appt on Friday, 01-04-20.

## 2020-01-01 NOTE — Progress Notes (Signed)
Replaced Hep B vaccine with order for New Heplisav B - 2 doses

## 2020-01-02 ENCOUNTER — Other Ambulatory Visit (INDEPENDENT_AMBULATORY_CARE_PROVIDER_SITE_OTHER): Payer: 59

## 2020-01-02 DIAGNOSIS — K7460 Unspecified cirrhosis of liver: Secondary | ICD-10-CM | POA: Diagnosis not present

## 2020-01-02 DIAGNOSIS — R188 Other ascites: Secondary | ICD-10-CM

## 2020-01-02 LAB — CBC WITH DIFFERENTIAL/PLATELET
Basophils Absolute: 0 10*3/uL (ref 0.0–0.1)
Basophils Relative: 0.6 % (ref 0.0–3.0)
Eosinophils Absolute: 0.1 10*3/uL (ref 0.0–0.7)
Eosinophils Relative: 2.8 % (ref 0.0–5.0)
HCT: 36.4 % (ref 36.0–46.0)
Hemoglobin: 12.4 g/dL (ref 12.0–15.0)
Lymphocytes Relative: 25.9 % (ref 12.0–46.0)
Lymphs Abs: 1.1 10*3/uL (ref 0.7–4.0)
MCHC: 34 g/dL (ref 30.0–36.0)
MCV: 87.4 fl (ref 78.0–100.0)
Monocytes Absolute: 0.3 10*3/uL (ref 0.1–1.0)
Monocytes Relative: 7 % (ref 3.0–12.0)
Neutro Abs: 2.6 10*3/uL (ref 1.4–7.7)
Neutrophils Relative %: 63.7 % (ref 43.0–77.0)
Platelets: 197 10*3/uL (ref 150.0–400.0)
RBC: 4.16 Mil/uL (ref 3.87–5.11)
RDW: 14.3 % (ref 11.5–15.5)
WBC: 4.1 10*3/uL (ref 4.0–10.5)

## 2020-01-02 LAB — PROTIME-INR
INR: 1.1 ratio — ABNORMAL HIGH (ref 0.8–1.0)
Prothrombin Time: 12.3 s (ref 9.6–13.1)

## 2020-01-02 LAB — HEPATIC FUNCTION PANEL
ALT: 31 U/L (ref 0–35)
AST: 36 U/L (ref 0–37)
Albumin: 3.9 g/dL (ref 3.5–5.2)
Alkaline Phosphatase: 88 U/L (ref 39–117)
Bilirubin, Direct: 0.2 mg/dL (ref 0.0–0.3)
Total Bilirubin: 0.7 mg/dL (ref 0.2–1.2)
Total Protein: 8 g/dL (ref 6.0–8.3)

## 2020-01-03 LAB — AFP TUMOR MARKER: AFP-Tumor Marker: 5.9 ng/mL

## 2020-01-04 ENCOUNTER — Ambulatory Visit: Payer: 59 | Admitting: Gastroenterology

## 2020-01-04 ENCOUNTER — Encounter: Payer: Self-pay | Admitting: Gastroenterology

## 2020-01-04 ENCOUNTER — Other Ambulatory Visit: Payer: Self-pay

## 2020-01-04 VITALS — BP 122/78 | HR 73 | Temp 98.2°F | Ht 62.5 in | Wt 209.1 lb

## 2020-01-04 DIAGNOSIS — K219 Gastro-esophageal reflux disease without esophagitis: Secondary | ICD-10-CM | POA: Diagnosis not present

## 2020-01-04 DIAGNOSIS — K7460 Unspecified cirrhosis of liver: Secondary | ICD-10-CM | POA: Diagnosis not present

## 2020-01-04 DIAGNOSIS — I8500 Esophageal varices without bleeding: Secondary | ICD-10-CM

## 2020-01-04 MED ORDER — FAMOTIDINE 20 MG PO TABS: 20.0000 mg | ORAL_TABLET | ORAL | Status: DC | PRN

## 2020-01-04 NOTE — Progress Notes (Signed)
HPI :  59 year old female here for a follow-up visit for cirrhosis.  She was previously followed by [name] and [facility] hepatology several years ago.  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  I was able to review her records from [place] today, she has had a prior liver biopsy which confirmed sarcoidosis of the liver, this was several years ago.  She has a history of esophageal varices dating back to 2012, at which time she was started on nadolol and has been on it ever since then.  Prior serologic work-up done for chronic liver diseases has been negative.  Blood work after our last visit showed that she was not immunized hepatitis B and she had that completed by [facility] since her last visit.  Overall she is really doing well without any complaints in regards to her liver disease.  She tolerates the nadolol well.  She denies any ascites, or lower extremity edema.  No history of hepatic encephalopathy.  She is generally been very well compensated since her diagnosis and has been quite stable.  She has not had any flares of her sarcoidosis, she has not required any steroids.  No FH of liver disease. No FH of colon cancer  She had labs recently with [place] as outlined below which looks stable/normal.  She is due for another ultrasound of her liver for [facility] screening in February.  She had an ultrasound of her liver last done in July which showed stable changes of cirrhosis without mass lesions.  Of note her last colonoscopy in 2016 was aborted as the sigmoid colon could not be traversed.  In this light we proceed with a virtual colonoscopy since have last seen her and there was no appreciable polyps on this exam.  She is not using any alcohol.  Otherwise regarding her reflux she has been doing really well.  She stopped PPI and has not been using much for her symptoms.  She asked what she can take as needed for symptoms moving forward.  Most recent endoscopic  workup: EGD 01/16/15 - no varices, normal exam Colonoscopy 01/16/15 - procedure aborted in the sigmoid colon due to angulated turn /diverticulosis  RUQ [place] 06/15/19 - IMPRESSION: The appearance of the liver is indicative known cirrhosis. No focal liver lesions are evident. It must be cautioned that the sensitivity of ultrasound for detection of focal liver lesions is diminished considerably in this circumstance. Heterogeneous echotexture potentially could indicate underlying dysplastic type change. If further evaluation is felt to be warranted from an imaging standpoint, MR or CT pre and post-contrast could be helpful to further evaluate.  Study otherwise unremarkable.   Virtual colonoscopy 06/25/19 - IMPRESSION: 1. Given limitations cited above, no polyp, mass or stricture. 2. Cirrhosis. Parenchymal heterogeneity may be due to fat deposition. If further evaluation is desired, MR abdomen without and with contrast is recommended. 3.  Aortic atherosclerosis (ICD10-170.0).   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 Acute renal failure ([place]) 06/11/2015  . Allergy    sinus allergies  . Chronic kidney disease (CKD), stage III (moderate)    Archie Endo 06/11/2015  . Cirrhosis ([place])    secondary to sarcoidosis  . Diabetes mellitus without complication ([place]) dx'd 4/68/0321   Archie Endo 06/11/2015  . Esophageal varices ([place])   . GERD (gastroesophageal reflux disease)   . Heart murmur   . Hypertension   . Sarcoidosis  Past Surgical History:  Procedure Laterality Date  . ABDOMINAL HYSTERECTOMY  1997   "partial"  . CARPAL TUNNEL RELEASE Right 1987  . CESAREAN SECTION  1979  . FOOT SURGERY Bilateral 2018  . REDUCTION MAMMAPLASTY Bilateral 2005  . TUBAL LIGATION  1981   Family History  Problem Relation Age of Onset  . Hypertension Mother   . Hypertension Brother   . Kidney disease Brother        ESRD on Dialysis  . Diabetes Brother    . CAD Sister   . Colon cancer Neg Hx   . Esophageal cancer Neg Hx   . Rectal cancer Neg Hx   . Stomach cancer Neg Hx    Social History   Tobacco Use  . Smoking status: Never Smoker  . Smokeless tobacco: Never Used  Substance Use Topics  . Alcohol use: No    Alcohol/week: 0.0 standard drinks  . Drug use: No   Current Outpatient Medications  Medication Sig Dispense Refill  . acetaminophen (TYLENOL) 500 MG tablet Take 1,000 mg by mouth every 6 (six) hours as needed for headache (pain).    Marland Kitchen azelastine (ASTELIN) 0.1 % nasal spray Place 2 sprays into both nostrils 2 (two) times daily. Use in each nostril as directed (Patient taking differently: Place 2 sprays into both nostrils 2 (two) times daily as needed for rhinitis or allergies. Use in each nostril as directed) 30 mL 0  . blood glucose meter kit and supplies KIT Dispense based on patient and insurance preference. Use up to four times daily as directed. (FOR ICD-9 250.00, 250.01). 1 each 0  . cyclobenzaprine (FLEXERIL) 10 MG tablet Take 1 tablet (10 mg total) by mouth 3 (three) times daily as needed for muscle spasms. (Patient taking differently: Take 10 mg by mouth at bedtime as needed for muscle spasms (back pain). ) 30 tablet 0  . glucose blood (ACCU-CHEK GUIDE) test strip Use to check blood sugar 3 times a day 300 each 12  . guaiFENesin (MUCINEX) 600 MG 12 hr tablet Take 600 mg by mouth 2 (two) times daily as needed for cough or to loosen phlegm.     . Insulin Glargine (LANTUS SOLOSTAR) 100 UNIT/ML Solostar Pen Inject 15 Units into the skin at bedtime. 5 pen 11  . Insulin Pen Needle 32G X 4 MM MISC Use 1x a day 100 each 3  . nadolol (CORGARD) 40 MG tablet TAKE 1 TABLET (40 MG TOTAL) BY MOUTH DAILY (Patient taking differently: Take 40 mg by mouth daily. ) 90 tablet 3  . triamterene-hydrochlorothiazide (MAXZIDE-25) 37.5-25 MG tablet Take 1 tablet by mouth daily. 90 tablet 3  . Blood Glucose Monitoring Suppl DEVI 1 each by Does not  apply route 2 (two) times daily for 1 day. 1 each o  . famotidine (PEPCID) 20 MG tablet Take 1 tablet (20 mg total) by mouth as needed for heartburn or indigestion.    . metFORMIN (GLUCOPHAGE) 500 MG tablet Take 1 tablet (500 mg total) by mouth 2 (two) times daily with a meal. 180 tablet 3   No current facility-administered medications for this visit.   Allergies  Allergen Reactions  . Codeine Nausea And Vomiting  . Erythromycin Nausea And Vomiting  . Ketek [Telithromycin] Nausea And Vomiting  . Oxycodone-Acetaminophen Nausea And Vomiting  . Penicillins Nausea And Vomiting    Did it involve swelling of the face/tongue/throat, SOB, or low BP? No Did it involve sudden or severe rash/hives, skin peeling, or any  reaction on the inside of your mouth or nose? No Did you need to seek medical attention at a hospital or doctor's office? No When did it last happen?teenager If all above answers are "NO", may proceed with cephalosporin use.  Marland Kitchen Propoxyphene N-Acetaminophen Nausea And Vomiting  . Sulfonamide Derivatives Nausea And Vomiting  . Vicodin [Hydrocodone-Acetaminophen] Nausea And Vomiting     Review of Systems: All systems reviewed and negative except where noted in HPI.   Lab Results  Component Value Date   WBC 4.1 01/02/2020   HGB 12.4 01/02/2020   HCT 36.4 01/02/2020   MCV 87.4 01/02/2020   PLT 197.0 01/02/2020    Lab Results  Component Value Date   CREATININE 1.44 (H) 07/02/2019   BUN 27 (H) 07/02/2019   NA 137 07/02/2019   K 3.7 07/02/2019   CL 107 07/02/2019   CO2 22 07/02/2019    Lab Results  Component Value Date   ALT 31 01/02/2020   AST 36 01/02/2020   ALKPHOS 88 01/02/2020   BILITOT 0.7 01/02/2020    Lab Results  Component Value Date   INR 1.1 (H) 01/02/2020   INR 1.0 06/15/2019   INR 1.19 06/11/2015     Physical Exam: BP 122/78 (BP Location: Left Arm, Patient Position: Sitting, Cuff Size: Normal)   Pulse 73   Temp 98.2 F (36.8 C) (Oral)    Ht 5' 2.5" (1.588 m)   Wt 209 lb 2 oz (94.9 kg)   BMI 37.64 kg/m  Constitutional: Pleasant,well-developed, female in no acute distress. HEENT: Normocephalic and atraumatic. Conjunctivae are normal. No scleral icterus. Neck supple.  Abdominal: Soft, nondistended, nontender. There are no masses palpable. No hepatomegaly. Extremities: no edema Lymphadenopathy: No cervical adenopathy noted. Neurological: Alert and oriented to person place and time. Skin: Skin is warm and dry. No rashes noted. Psychiatric: Normal mood and affect. Behavior is normal.   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  She has a remote history of esophageal varices for which she has been on longstanding nadolol and tolerates it well.  In this light she does not need any further surveillance of varices.  She is due for [facility] screening this month, will order another ultrasound.  Her labs are otherwise up-to-date and she has been well compensated and stable over time.  Fortunately it sounds like her sarcoidosis is otherwise quite stable, she has not required steroids for this in a long time from what I can gather.  We discussed what cirrhosis is in general, long-term risks of decompensation and HCC.  We will continue her medical regimen for now, await her [facility] screening and repeat labs again in 6 months.  She will continue to avoid alcohol and let me know if there is any concerning symptoms that arise in the interim.  She will continue nadolol indefinitely.  Otherwise no other specific therapy recommended at this time, hopefully she continues to do well as she has done so over the last several years.  GERD - recommend use of Pepcid over-the-counter as needed for reflux which appears mild intermittent.  She agreed  I spent 30 minutes of time,  including in depth chart review, independent review of results as outlined above, communicating results with the patient directly, face-to-face time with the patient, coordinating care, and ordering studies and medications as appropriate, and documenting this  encounter.  Mead Cellar, MD [facility]

## 2020-01-04 NOTE — Patient Instructions (Signed)
If you are age 61 or older, your body mass index should be between 23-30. Your Body mass index is 37.64 kg/m. If this is out of the aforementioned range listed, please consider follow up with your Primary Care Provider.  If you are age 16 or younger, your body mass index should be between 19-25. Your Body mass index is 37.64 kg/m. If this is out of the aformentioned range listed, please consider follow up with your Primary Care Provider.   You will be due for labs in August 2021. We will remind you when it is time to go to the lab.   You have been scheduled for an RUQ abdominal ultrasound at [facility] (1st floor of hospital) on Wednesday, February 10th  at 9:30am. Please arrive 15 minutes prior to your appointment for registration. Make certain not to have anything to eat or drink 6 hours prior to your appointment. Should you need to reschedule your appointment, please contact radiology at [phone]. This test typically takes about 30 minutes to perform.  You can use over-the-counter Pepcid 20mg  (famotidine) as needed for GERD.  Thank you for entrusting me with your care and for choosing [facility], Dr. [facility]

## 2020-01-09 ENCOUNTER — Other Ambulatory Visit: Payer: Self-pay

## 2020-01-09 ENCOUNTER — Ambulatory Visit (HOSPITAL_COMMUNITY)
Admission: RE | Admit: 2020-01-09 | Discharge: 2020-01-09 | Disposition: A | Discharge status: Home / Self Care | Payer: 59 | Source: Ambulatory Visit | Attending: Gastroenterology | Admitting: Gastroenterology

## 2020-01-09 DIAGNOSIS — K7460 Unspecified cirrhosis of liver: Secondary | ICD-10-CM | POA: Diagnosis not present

## 2020-02-20 ENCOUNTER — Other Ambulatory Visit: Payer: Self-pay

## 2020-02-22 ENCOUNTER — Ambulatory Visit: Payer: 59 | Admitting: Internal Medicine

## 2020-02-22 ENCOUNTER — Other Ambulatory Visit: Payer: Self-pay

## 2020-02-22 ENCOUNTER — Encounter: Payer: Self-pay | Admitting: Internal Medicine

## 2020-02-22 VITALS — BP 148/70 | HR 71 | Ht 62.5 in | Wt 210.0 lb

## 2020-02-22 DIAGNOSIS — Z794 Long term (current) use of insulin: Secondary | ICD-10-CM

## 2020-02-22 DIAGNOSIS — E1165 Type 2 diabetes mellitus with hyperglycemia: Secondary | ICD-10-CM

## 2020-02-22 DIAGNOSIS — E669 Obesity, unspecified: Secondary | ICD-10-CM | POA: Diagnosis not present

## 2020-02-22 LAB — POCT GLYCOSYLATED HEMOGLOBIN (HGB A1C): Hemoglobin A1C: 5.9 % — AB (ref 4.0–5.6)

## 2020-02-22 NOTE — Patient Instructions (Signed)
Please continue: - Metformin 500 mg 2x a day  Please decrease: - Lantus to 6 units units at bedtime for the next week. If sugars are still well controlled, stop Lantus.  Targets: - before meals: 70-120 - after meals: <150  Please come back for a follow-up appointment in 4 months.

## 2020-02-22 NOTE — Addendum Note (Signed)
Addended by: Darliss Ridgel I on: 02/22/2020 02:26 PM   Modules accepted: Orders

## 2020-02-22 NOTE — Progress Notes (Signed)
Patient ID: Melissa [name], female   DOB: 01/25/1961, 59 y.o.   MRN: [id number]  This visit occurred during the SARS-CoV-2 public health emergency.  Safety protocols were in place, including screening questions prior to the visit, additional usage of staff PPE, and extensive cleaning of exam room while observing appropriate contact time as indicated for disinfecting solutions.   HPI: Melissa [name] is a 59 y.o.-year-old female,  Returning for follow-up for DM2, dx in 06/11/2015 (steroid-induced - for purpura).  Last visit 4 months ago (virtual).  Reviewed history: Patient was diagnosed with type 2 diabetes (after steroids for purpura) in 05/2015, after which she was started on basal-bolus insulin regimen.  Afterwards, due to the improvement in Melissa blood sugars she was able to be taken off insulin and remained only on low-dose metformin.    However, she stopped checking blood sugars after our last appointment. She had a colonoscopy 05/2019 >> felt very poorly during the prep: polydipsia/polyuria and significant for some fatigue.  After t initial to feel poorly and was eventually admitted for DKA last month.   Insulin was restarted and she was referred back to endocrinology.  Sugars improved since then.  Reviewed HbA1c levels: 12/042020: HbA1c 5.1% Lab Results  Component Value Date   HGBA1C 15.3 (H) 07/01/2019   HGBA1C >15.5 (H) 06/30/2019   HGBA1C 5.2 06/18/2016   She is on: - Metformin 500 mg 2x a day - Levemir 15 units 2x a day >> 15 >> Lantus 10 units at bedtime  Pt checks Melissa sugars ~3 times a day: - am: 77, 84-103, 132 >> 68-94, 101 >> 84-95 - 2h after b'fast: 95, 115 >> n/c - lunch: 63-111 >> n/c >> 69-86 - 2h after lunch: 96 >> n/c >> 73-106 - dinner: 86-100 >> 73-81 - 2h after dinner: 77-125 >> 70-78 >> 73, 75 - bedtime: n/c Lowest: 63 >> 69 >> 69 Highest: 300s at hosp d/c >> 101 >> 103  Glucometer: Free Style >> AccuChek guide  Available labs  reviewed:  11/02/2019:  ACR <6, Lipids: 157/60/57/88, glucose 90, BUN/creatinine 21/1.23, GFR 55  08/22/2019: CMP normal with glucose 92, however, BUN/creatinine 27/1.42, GFR 46,  Sodium 146, otherwise normal CMP, including alkaline phosphatase of 106  07/06/2019: CMP normal with exception of glucose 131.  BUN/creatinine 11/1.17, GFR 59.  AST/ALT 53/35, both elevated.  Alkaline phosphatase 179, elevated.  Albumin low, at 3.2.  06/29/2019:  Glucose 500  -+ CKD: Lab Results  Component Value Date   BUN 27 (H) 07/02/2019   CREATININE 1.44 (H) 07/02/2019  She is not on ACE inhibitor or ARB. She is not on a statin.  - last eye exam was in 2020: No DR  -No numbness and tingling in Melissa feet.   ROS: Constitutional: no weight gain/+ weight loss, no fatigue, no subjective hyperthermia, no subjective hypothermia Eyes: no blurry vision, no xerophthalmia ENT: no sore throat, no nodules palpated in neck, no dysphagia, no odynophagia, no hoarseness Cardiovascular: no CP/no SOB/no palpitations/no leg swelling Respiratory: no cough/no SOB/no wheezing Gastrointestinal: no N/no V/no D/no C/no acid reflux Musculoskeletal: no muscle aches/no joint aches Skin: no rashes, no hair loss Neurological: no tremors/no numbness/no tingling/no dizziness  I reviewed pt's medications, allergies, PMH, social hx, family hx, and changes were documented in the history of present illness. Otherwise, unchanged from my initial visit note.   Past Medical History:  Diagnosis Date  . Acute renal failure ([place]) 06/11/2015  . Allergy    sinus allergies  .  Chronic kidney disease (CKD), stage III (moderate)    Archie Endo 06/11/2015  . Cirrhosis ([place])    secondary to sarcoidosis  . Diabetes mellitus without complication ([place]) dx'd 04/15/6159   Archie Endo 06/11/2015  . Esophageal varices ([place])   . GERD (gastroesophageal reflux disease)   . Heart murmur   . Hypertension   . Sarcoidosis    Past Surgical History:  Procedure  Laterality Date  . ABDOMINAL HYSTERECTOMY  1997   "partial"  . CARPAL TUNNEL RELEASE Right 1987  . CESAREAN SECTION  1979  . FOOT SURGERY Bilateral 2018  . REDUCTION MAMMAPLASTY Bilateral 2005  . TUBAL LIGATION  1981   Social History   Social History  . Marital Status: Married    Spouse Name: Melissa [name], Melissa [name]  . Number of Children: 1  . Years of Education: 14+   Occupational History  . 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 Smoking status: Never Smoker   . Smokeless tobacco: Never Used  . Alcohol Use: No  . Drug Use: No  . Sexual Activity:    Partners: Male    Birth Control/ Protection: Surgical   Social History Narrative   Lives with Melissa husband and two dogs. 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 She feels like the grandmother to Melissa sister's 2 children, since the sister's sudden and unexpected death in 2014/07/06.   Current Outpatient Medications on File Prior to Visit  Medication Sig Dispense Refill  . acetaminophen (TYLENOL) 500 MG tablet Take 1,000 mg by mouth every 6 (six) hours as needed for headache (pain).    Marland Kitchen azelastine (ASTELIN) 0.1 % nasal spray Place 2 sprays into both nostrils 2 (two) times daily. Use in each nostril as directed (Patient taking differently: Place 2 sprays into both nostrils 2 (two) times daily as needed for rhinitis or allergies. Use in each nostril as directed) 30 mL 0  . blood glucose meter kit and supplies KIT Dispense based on patient and insurance preference. Use up to four times daily as directed. (FOR ICD-9 250.00, 250.01). 1 each 0  . Blood Glucose Monitoring Suppl DEVI 1 each by Does not apply route 2 (two) times daily for 1 day. 1 each o  . cyclobenzaprine (FLEXERIL) 10 MG tablet Take 1 tablet (10 mg total) by mouth 3 (three) times daily as needed for muscle spasms. (Patient taking differently: Take 10 mg by mouth at bedtime as needed for muscle spasms (back pain). ) 30 tablet 0   . famotidine (PEPCID) 20 MG tablet Take 1 tablet (20 mg total) by mouth as needed for heartburn or indigestion.    Marland Kitchen glucose blood (ACCU-CHEK GUIDE) test strip Use to check blood sugar 3 times a day 300 each 12  . guaiFENesin (MUCINEX) 600 MG 12 hr tablet Take 600 mg by mouth 2 (two) times daily as needed for cough or to loosen phlegm.     . Insulin Glargine (LANTUS SOLOSTAR) 100 UNIT/ML Solostar Pen Inject 15 Units into the skin at bedtime. 5 pen 11  . Insulin Pen Needle 32G X 4 MM MISC Use 1x a day 100 each 3  . metFORMIN (GLUCOPHAGE) 500 MG tablet Take 1 tablet (500 mg total) by mouth 2 (two) times daily with a meal. 180 tablet 3  . nadolol (CORGARD) 40 MG tablet TAKE 1 TABLET (40 MG TOTAL) BY MOUTH DAILY (Patient taking differently: Take 40 mg by mouth daily. )  90 tablet 3  . triamterene-hydrochlorothiazide (MAXZIDE-25) 37.5-25 MG tablet Take 1 tablet by mouth daily. 90 tablet 3   No current facility-administered medications on file prior to visit.   Allergies  Allergen Reactions  . Codeine Nausea And Vomiting  . Erythromycin Nausea And Vomiting  . Ketek [Telithromycin] Nausea And Vomiting  . Oxycodone-Acetaminophen Nausea And Vomiting  . Penicillins Nausea And Vomiting    Did it involve swelling of the face/tongue/throat, SOB, or low BP? No Did it involve sudden or severe rash/hives, skin peeling, or any reaction on the inside of your mouth or nose? No Did you need to seek medical attention at a hospital or doctor's office? No When did it last happen?teenager If all above answers are "NO", may proceed with cephalosporin use.  Marland Kitchen Propoxyphene N-Acetaminophen Nausea And Vomiting  . Sulfonamide Derivatives Nausea And Vomiting  . Vicodin [Hydrocodone-Acetaminophen] Nausea And Vomiting   Family History  Problem Relation Age of Onset  . Hypertension Mother   . Hypertension Brother   . Kidney disease Brother        ESRD on Dialysis  . Diabetes Brother   . CAD Sister   . Colon  cancer Neg Hx   . Esophageal cancer Neg Hx   . Rectal cancer Neg Hx   . Stomach cancer Neg Hx    PE: BP (!) 148/70   Pulse 71   Ht 5' 2.5" (1.588 m)   Wt 210 lb (95.3 kg)   SpO2 97%   BMI 37.80 kg/m  Body mass index is 37.8 kg/m. Wt Readings from Last 3 Encounters:  02/22/20 210 lb (95.3 kg)  01/04/20 209 lb 2 oz (94.9 kg)  07/30/19 220 lb (99.8 kg)   Constitutional: overweight, in NAD Eyes: PERRLA, EOMI, no exophthalmos ENT: moist mucous membranes, no thyromegaly, no cervical lymphadenopathy Cardiovascular: RRR, No MRG Respiratory: CTA B Gastrointestinal: abdomen soft, NT, ND, BS+ Musculoskeletal: no deformities, strength intact in all 4 Skin: moist, warm, no rashes Neurological: no tremor with outstretched hands, DTR normal in all 4  ASSESSMENT: 1. DM2, uncontrolled, insulin-dependent, with hyperglycemia  We ruled out type 1 diabetes: Component     Latest Ref Rng & Units 07/30/2019  Glucose, Plasma     65 - 99 mg/dL 95  C-Peptide     0.80 - 3.85 ng/mL 2.10  Islet Cell Ab     Neg:<1:1 Negative  ZNT8 Antibodies     U/mL <15  Glutamic Acid Decarb Ab     <5 IU/mL <5   2.  Obesity class II  PLAN:  1. Patient with history of steroid-induced diabetes, previously in remission but exacerbated in 2020 when she was admitted with DKA (05/2019).  An HbA1c was undetectably high at that time and a repeat the next day was 15.4%.  She was started on Lantus and Metformin with precipitous improvement in Melissa diabetes control.  She even developed low blood sugars so we started to reduce Melissa Lantus dose.  At last visit we reduced it from 15 to 10 units as she was still having low blood sugars. -At this visit, sugars are excellent and even had some mild lows in the 60s.  Therefore, I advised Melissa to decrease Melissa Lantus dose to only 6 units daily and then, if sugars remain controlled, to stop Lantus completely.  I advised Melissa that from now on to continue to check Melissa sugars every day,  rotating check times, once a day, and let me know if they start increasing. -  I suggested to:  Patient Instructions  Please continue: - Metformin 500 mg 2x a day  Please decrease: - Lantus to 6 units units at bedtime for the next week. If sugars are still well controlled, stop Lantus.  Targets: - before meals: 70-120 - after meals: <150  Please come back for a follow-up appointment in 4 months.  - we checked Melissa HbA1c: 5.9% (excellent), slightly increased from previous HbA1c checked by PCP in 10/2019 - advised to check sugars at different times of the day - 1x a day, rotating check times - advised for yearly eye exams >> she is UTD - return to clinic in 4 months  2.  Obesity class II -She lost 10 pounds since last visit -At last visit we were able to decrease the insulin dose, which is normally conducive to increase weight gain.  At this visit, I am hoping that we can even stop it completely.  Philemon Kingdom, MD PhD [facility]

## 2020-07-01 ENCOUNTER — Telehealth: Payer: Self-pay

## 2020-07-01 DIAGNOSIS — I8500 Esophageal varices without bleeding: Secondary | ICD-10-CM

## 2020-07-01 DIAGNOSIS — K7460 Unspecified cirrhosis of liver: Secondary | ICD-10-CM

## 2020-07-01 NOTE — Telephone Encounter (Signed)
-----   Message from Narain Render, CMA sent at 01/10/2020  8:37 AM EST ----- Regarding: ruq ultrasound due RUQ ultrasound due in August 2021 - HCC screening

## 2020-07-01 NOTE — Telephone Encounter (Signed)
Scheduled pt for RUQ ultrasound at [facility] on Wed, 8-18 9:00am. Arrive at 8:45am NPO 6 hours.  Labs are in.  Called patient and LM to call back to discuss. Also Sent message via MyChart

## 2020-07-02 NOTE — Telephone Encounter (Signed)
Called and Left another message for pt with all information and my call back number to call with any questions.

## 2020-07-09 ENCOUNTER — Telehealth: Payer: Self-pay

## 2020-07-09 NOTE — Telephone Encounter (Signed)
Patent called to discuss upcoming RUQ U/S at [facility] on 8-18 at 9:00am. All questions answered.

## 2020-07-16 ENCOUNTER — Other Ambulatory Visit (INDEPENDENT_AMBULATORY_CARE_PROVIDER_SITE_OTHER): Payer: 59

## 2020-07-16 ENCOUNTER — Ambulatory Visit (HOSPITAL_COMMUNITY)
Admission: RE | Admit: 2020-07-16 | Discharge: 2020-07-16 | Disposition: A | Discharge status: Home / Self Care | Payer: 59 | Source: Ambulatory Visit | Attending: Gastroenterology | Admitting: Gastroenterology

## 2020-07-16 ENCOUNTER — Other Ambulatory Visit: Payer: Self-pay

## 2020-07-16 DIAGNOSIS — I8500 Esophageal varices without bleeding: Secondary | ICD-10-CM

## 2020-07-16 DIAGNOSIS — K7460 Unspecified cirrhosis of liver: Secondary | ICD-10-CM

## 2020-07-16 LAB — COMPREHENSIVE METABOLIC PANEL
ALT: 35 U/L (ref 0–35)
AST: 35 U/L (ref 0–37)
Albumin: 3.8 g/dL (ref 3.5–5.2)
Alkaline Phosphatase: 109 U/L (ref 39–117)
BUN: 26 mg/dL — ABNORMAL HIGH (ref 6–23)
CO2: 29 mEq/L (ref 19–32)
Calcium: 10 mg/dL (ref 8.4–10.5)
Chloride: 102 mEq/L (ref 96–112)
Creatinine, Ser: 1.3 mg/dL — ABNORMAL HIGH (ref 0.40–1.20)
GFR: 50.34 mL/min — ABNORMAL LOW (ref >60.00)
Glucose, Bld: 121 mg/dL — ABNORMAL HIGH (ref 70–99)
Potassium: 3.6 mEq/L (ref 3.5–5.1)
Sodium: 139 mEq/L (ref 135–145)
Total Bilirubin: 0.8 mg/dL (ref 0.2–1.2)
Total Protein: 7.8 g/dL (ref 6.0–8.3)

## 2020-07-16 LAB — CBC WITH DIFFERENTIAL/PLATELET
Basophils Absolute: 0 10*3/uL (ref 0.0–0.1)
Basophils Relative: 0.9 % (ref 0.0–3.0)
Eosinophils Absolute: 0.2 10*3/uL (ref 0.0–0.7)
Eosinophils Relative: 3.4 % (ref 0.0–5.0)
HCT: 36.4 % (ref 36.0–46.0)
Hemoglobin: 12.2 g/dL (ref 12.0–15.0)
Lymphocytes Relative: 30.4 % (ref 12.0–46.0)
Lymphs Abs: 1.4 10*3/uL (ref 0.7–4.0)
MCHC: 33.5 g/dL (ref 30.0–36.0)
MCV: 87.8 fl (ref 78.0–100.0)
Monocytes Absolute: 0.3 10*3/uL (ref 0.1–1.0)
Monocytes Relative: 7.2 % (ref 3.0–12.0)
Neutro Abs: 2.6 10*3/uL (ref 1.4–7.7)
Neutrophils Relative %: 58.1 % (ref 43.0–77.0)
Platelets: 182 10*3/uL (ref 150.0–400.0)
RBC: 4.14 Mil/uL (ref 3.87–5.11)
RDW: 14.1 % (ref 11.5–15.5)
WBC: 4.5 10*3/uL (ref 4.0–10.5)

## 2020-07-16 LAB — PROTIME-INR
INR: 1.1 ratio — ABNORMAL HIGH (ref 0.8–1.0)
Prothrombin Time: 12 s (ref 9.6–13.1)

## 2020-07-17 LAB — AFP TUMOR MARKER: AFP-Tumor Marker: 4.9 ng/mL

## 2020-07-23 ENCOUNTER — Other Ambulatory Visit: Payer: Self-pay | Admitting: Internal Medicine

## 2020-07-25 ENCOUNTER — Encounter: Payer: Self-pay | Admitting: Internal Medicine

## 2020-07-25 ENCOUNTER — Other Ambulatory Visit: Payer: Self-pay

## 2020-07-25 ENCOUNTER — Ambulatory Visit: Payer: 59 | Admitting: Internal Medicine

## 2020-07-25 VITALS — BP 126/78 | HR 70 | Ht 62.5 in | Wt 207.0 lb

## 2020-07-25 DIAGNOSIS — E1165 Type 2 diabetes mellitus with hyperglycemia: Secondary | ICD-10-CM

## 2020-07-25 DIAGNOSIS — Z794 Long term (current) use of insulin: Secondary | ICD-10-CM | POA: Diagnosis not present

## 2020-07-25 DIAGNOSIS — E669 Obesity, unspecified: Secondary | ICD-10-CM | POA: Diagnosis not present

## 2020-07-25 LAB — POCT GLYCOSYLATED HEMOGLOBIN (HGB A1C): Hemoglobin A1C: 5.7 % — AB (ref 4.0–5.6)

## 2020-07-25 MED ORDER — METFORMIN HCL 500 MG PO TABS: 500.0000 mg | ORAL_TABLET | Freq: Two times a day (BID) | ORAL | 3 refills | Status: DC

## 2020-07-25 NOTE — Progress Notes (Signed)
Patient ID: Melissa [name], female   DOB: Aug 26, 1961, 59 y.o.   MRN: [id number]  This visit occurred during the SARS-CoV-2 public health emergency.  Safety protocols were in place, including screening questions prior to the visit, additional usage of staff PPE, and extensive cleaning of exam room while observing appropriate contact time as indicated for disinfecting solutions.   HPI: Melissa [name] is a 58 y.o.-year-old female,  Returning for follow-up for DM2, dx in 06/11/2015 (steroid-induced - for purpura).  Last visit 5 months ago.  Reviewed history: Patient was diagnosed with type 2 diabetes (after steroids for purpura) in 05/2015, after which she was started on basal-bolus insulin regimen.  Afterwards, due to the improvement in her blood sugars she was able to be taken off insulin and remained only on low-dose metformin.    However, she stopped checking blood sugars after our last appointment. She had a colonoscopy 05/2019 >> felt very poorly during the prep: polydipsia/polyuria and significant for some fatigue.  After t initial to feel poorly and was eventually admitted for DKA last month.   Insulin was restarted and she was referred back to endocrinology.  Sugars improved since then.  We ruled out type 1 diabetes on 07/30/2019 (please see assessment section)  Reviewed HbA1c levels: Lab Results  Component Value Date   HGBA1C 5.9 (A) 02/22/2020   HGBA1C 15.3 (H) 07/01/2019   HGBA1C >15.5 (H) 06/30/2019  12/042020: HbA1c 5.1%  She is on: - Metformin 500 mg 2x a day  - STOPPED 02/2020  Patient checks her sugars seldom: - am: 77, 84-103, 132 >> 68-94, 101 >> 84-95 >> 70-86 - 2h after b'fast: 95, 115 >> n/c - lunch: 63-111 >> n/c >> 69-86 >> 80s-90s - 2h after lunch: 96 >> n/c >> 73-106 >> n/c - dinner: 86-100 >> 73-81 >> 70-80s, 90s - 2h after dinner: 77-125 >> 70-78 >> 73, 75 >> n/c - bedtime: n/c Lowest: 63 >> 69 >> 69 >> 70 Highest: 300s at hosp d/c >> 101 >> 103 >>  106  Glucometer: Free Style >> AccuChek guide  Previous labs reviewed:  11/02/2019:  ACR <6, Lipids: 157/60/57/88, glucose 90, BUN/creatinine 21/1.23, GFR 55  08/22/2019: CMP normal with glucose 92, however, BUN/creatinine 27/1.42, GFR 46,  Sodium 146, otherwise normal CMP, including alkaline phosphatase of 106  07/06/2019: CMP normal with exception of glucose 131.  BUN/creatinine 11/1.17, GFR 59.  AST/ALT 53/35, both elevated.  Alkaline phosphatase 179, elevated.  Albumin low, at 3.2.  06/29/2019:  Glucose 500  -+ CKD: Lab Results  Component Value Date   BUN 26 (H) 07/16/2020   CREATININE 1.30 (H) 07/16/2020  She is not on ACE inhibitor or ARB.  She is not on a statin.  - last eye exam was in 06/2019: No DR  -She denies numbness and tingling in her feet.   ROS: Constitutional: + Both weight gain and weight loss, no fatigue, no subjective hyperthermia, no subjective hypothermia Eyes: no blurry vision, no xerophthalmia ENT: no sore throat, no nodules palpated in neck, no dysphagia, no odynophagia, no hoarseness Cardiovascular: no CP/no SOB/no palpitations/no leg swelling Respiratory: no cough/no SOB/no wheezing Gastrointestinal: no N/no V/no D/no C/no acid reflux Musculoskeletal: no muscle aches/no joint aches Skin: no rashes, no hair loss Neurological: no tremors/no numbness/no tingling/no dizziness  I reviewed pt's medications, allergies, PMH, social hx, family hx, and changes were documented in the history of present illness. Otherwise, unchanged from my initial visit note.  Past Medical History:  Diagnosis  Date  . Acute renal failure ([place]) 2015-06-19  . Allergy    sinus allergies  . Chronic kidney disease (CKD), stage III (moderate)    Archie Endo 06-19-2015  . 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 Diabetes mellitus without complication ([place]) dx'd 1/61/0960   Archie Endo Jun 19, 2015  . Esophageal varices ([place])   . GERD (gastroesophageal reflux disease)   . Heart  murmur   . Hypertension   . Sarcoidosis    Past Surgical History:  Procedure Laterality Date  . ABDOMINAL HYSTERECTOMY  1997   "partial"  . CARPAL TUNNEL RELEASE Right 1987  . CESAREAN SECTION  1979  . FOOT SURGERY Bilateral 2018  . REDUCTION MAMMAPLASTY Bilateral 2005  . TUBAL LIGATION  1981   Social History   Social History  . Marital Status: Married    Spouse Name: Serina Nichter, Brooke Bonito  . Number of Children: 1  . Years of Education: 14+   Occupational History  . Sales executive for [employer]     Adult Medicare   Social History Main Topics  . Smoking status: Never Smoker   . Smokeless tobacco: Never Used  . Alcohol Use: No  . Drug Use: No  . Sexual Activity:    Partners: Male    Birth Control/ Protection: Surgical   Social History Narrative   Lives with her husband and two dogs. Her son is a Engineer, agricultural in [place], [place]. She feels like the grandmother to her sister's 2 children, since the sister's sudden and unexpected death in Jun 18, 2014.   Current Outpatient Medications on File Prior to Visit  Medication Sig Dispense Refill  . acetaminophen (TYLENOL) 500 MG tablet Take 1,000 mg by mouth every 6 (six) hours as needed for headache (pain).    Marland Kitchen azelastine (ASTELIN) 0.1 % nasal spray Place 2 sprays into both nostrils 2 (two) times daily. Use in each nostril as directed (Patient taking differently: Place 2 sprays into both nostrils 2 (two) times daily as needed for rhinitis or allergies. Use in each nostril as directed) 30 mL 0  . blood glucose meter kit and supplies KIT Dispense based on patient and insurance preference. Use up to four times daily as directed. (FOR ICD-9 250.00, 250.01). 1 each 0  . cyclobenzaprine (FLEXERIL) 10 MG tablet Take 1 tablet (10 mg total) by mouth 3 (three) times daily as needed for muscle spasms. (Patient taking differently: Take 10 mg by mouth at bedtime as needed for muscle spasms (back pain). ) 30 tablet 0  . famotidine (PEPCID) 20  MG tablet Take 1 tablet (20 mg total) by mouth as needed for heartburn or indigestion.    Marland Kitchen glucose blood (ACCU-CHEK GUIDE) test strip Use to check blood sugar 3 times a day 300 each 12  . guaiFENesin (MUCINEX) 600 MG 12 hr tablet Take 600 mg by mouth 2 (two) times daily as needed for cough or to loosen phlegm.     . Insulin Glargine (LANTUS SOLOSTAR) 100 UNIT/ML Solostar Pen Inject 15 Units into the skin at bedtime. 5 pen 11  . Insulin Pen Needle 32G X 4 MM MISC Use 1x a day 100 each 3  . metFORMIN (GLUCOPHAGE) 500 MG tablet TAKE 1 TABLET (500 MG TOTAL) BY MOUTH 2 (TWO) TIMES DAILY WITH A MEAL. 60 tablet 11  . nadolol (CORGARD) 40 MG tablet TAKE 1 TABLET (40 MG TOTAL) BY MOUTH DAILY (Patient taking differently: Take 40 mg by mouth daily. ) 90 tablet 3  .  triamterene-hydrochlorothiazide (MAXZIDE-25) 37.5-25 MG tablet Take 1 tablet by mouth daily. 90 tablet 3  . Blood Glucose Monitoring Suppl DEVI 1 each by Does not apply route 2 (two) times daily for 1 day. 1 each o   No current facility-administered medications on file prior to visit.   Allergies  Allergen Reactions  . Codeine Nausea And Vomiting  . Erythromycin Nausea And Vomiting  . Ketek [Telithromycin] Nausea And Vomiting  . Oxycodone-Acetaminophen Nausea And Vomiting  . Penicillins Nausea And Vomiting    Did it involve swelling of the face/tongue/throat, SOB, or low BP? No Did it involve sudden or severe rash/hives, skin peeling, or any reaction on the inside of your mouth or nose? No Did you need to seek medical attention at a hospital or doctor's office? No When did it last happen?teenager If all above answers are "NO", may proceed with cephalosporin use.  Marland Kitchen Propoxyphene N-Acetaminophen Nausea And Vomiting  . Sulfonamide Derivatives Nausea And Vomiting  . Vicodin [Hydrocodone-Acetaminophen] Nausea And Vomiting   Family History  Problem Relation Age of Onset  . Hypertension Mother   . Hypertension Brother   . Kidney  disease Brother        ESRD on Dialysis  . Diabetes Brother   . CAD Sister   . Colon cancer Neg Hx   . Esophageal cancer Neg Hx   . Rectal cancer Neg Hx   . Stomach cancer Neg Hx    PE: BP 126/78   Pulse 70   Ht 5' 2.5" (1.588 m)   Wt 207 lb (93.9 kg)   SpO2 98%   BMI 37.26 kg/m  Body mass index is 37.8 kg/m. Wt Readings from Last 3 Encounters:  07/25/20 207 lb (93.9 kg)  02/22/20 210 lb (95.3 kg)  01/04/20 209 lb 2 oz (94.9 kg)   Constitutional: overweight, in NAD Eyes: PERRLA, EOMI, no exophthalmos ENT: moist mucous membranes, no thyromegaly, no cervical lymphadenopathy Cardiovascular: RRR, No MRG Respiratory: CTA B Gastrointestinal: abdomen soft, NT, ND, BS+ Musculoskeletal: no deformities, strength intact in all 4 Skin: moist, warm, no rashes Neurological: no tremor with outstretched hands, DTR normal in all 4  ASSESSMENT: 1. DM2, uncontrolled, insulin-dependent, with hyperglycemia  We ruled out type 1 diabetes: Component     Latest Ref Rng & Units 07/30/2019  Glucose, Plasma     65 - 99 mg/dL 95  C-Peptide     0.80 - 3.85 ng/mL 2.10  Islet Cell Ab     Neg:<1:1 Negative  ZNT8 Antibodies     U/mL <15  Glutamic Acid Decarb Ab     <5 IU/mL <5   2.  Obesity class II  PLAN:  1. Patient with history of steroid-induced diabetes, previously in remission but exacerbated in 2020 when she was admitted with DKA (05/2019).  An HbA1c was undetectably high at that time and on repeat the next day it was 15.4%.  She was started on Lantus and Metformin with precipitous improvement in her diabetes control.  She even developed low blood sugars so we started to reduce her Lantus dose.  At last visit, I advised her to decrease the dose further and even stop if sugars remain controlled.  Of note, we did rule out type 1 diabetes (insulin deficiency and pancreatic autoimmunity) 06/2019. -At today's visit, she is only on Metformin start maximal dose.  She is tolerating this well. -At  this visit, she is not checking sugars consistently, however, her sugars appear to be all at goal and  excellent.  For now, we will not change her regimen but at next visit I would like to reduce her Metformin dose to only 500 mg once a day if sugars remain as well-controlled as now.  She continues to adjust her diet and I also encouraged her to start exercising.  Also, I advised her to continue her blood sugars once a day or every other day - I suggested to:  Patient Instructions  Please continue: - Metformin 500 mg 2x a day  Targets: - before meals: 70-120 - after meals: <150  Please come back for a follow-up appointment in 6 months.  - we checked her HbA1c: 5.7% (lower) - advised for yearly eye exams >> she is not UTD but has an appointment scheduled for next month - return to clinic in 4 months  2.  Obesity class II -She lost 10 pounds before last visit -She lost 3 pounds net since last visit but mentions that she initially lost weight and then she gained it back recently -Fortunately, we were able to stop her insulin, which can promote weight gain and fluid retention  Philemon Kingdom, MD PhD [facility] Endocrinology

## 2020-07-25 NOTE — Addendum Note (Signed)
Addended by: Darliss Ridgel I on: 07/25/2020 01:26 PM   Modules accepted: Orders

## 2020-07-25 NOTE — Patient Instructions (Addendum)
Please continue: - Metformin 500 mg 2x a day  Targets: - before meals: 70-120 - after meals: <150  Please come back for a follow-up appointment in 6 months.

## 2020-09-25 ENCOUNTER — Ambulatory Visit: Payer: 59 | Admitting: Gastroenterology

## 2021-01-02 ENCOUNTER — Telehealth: Payer: Self-pay

## 2021-01-02 DIAGNOSIS — K7460 Unspecified cirrhosis of liver: Secondary | ICD-10-CM

## 2021-01-02 NOTE — Telephone Encounter (Signed)
Patient due for labs and liver U/S for [facility] screening. Patient has been Scheduled for an abdominal ultrasound at [facility] on Feb. 21  at 9:00am. To Arrive at 9:15am  NPO 6 hours.  Letter mailed to patient and MyChart message sent. Patient needs to go to the lab

## 2021-01-02 NOTE — Telephone Encounter (Signed)
-----   Message from Flahive Render, CMA sent at 07/16/2020  3:38 PM EDT ----- Regarding: labs due 12-2020 Pt due for cbc, cmet, pt-inr and afp around 01-16-2021.  Check when last RUQ U/S was and due next

## 2021-01-19 ENCOUNTER — Ambulatory Visit (HOSPITAL_COMMUNITY): Admission: RE | Admit: 2021-01-19 | Payer: 59 | Source: Ambulatory Visit

## 2021-01-19 ENCOUNTER — Telehealth: Payer: Self-pay

## 2021-01-19 NOTE — Telephone Encounter (Signed)
Called and LM for patient to call us to discuss being rescheduled for her RUQ ultrasound for Dr. Adela Lank. Patient is also due for labs.  A letter was mailed to the patient on 01-02-21 with the appointment information.

## 2021-01-30 ENCOUNTER — Ambulatory Visit: Payer: 59 | Admitting: Internal Medicine

## 2021-07-28 ENCOUNTER — Other Ambulatory Visit: Payer: Self-pay | Admitting: Internal Medicine

## 2021-08-03 ENCOUNTER — Other Ambulatory Visit: Payer: Self-pay | Admitting: Internal Medicine

## 2021-08-19 ENCOUNTER — Other Ambulatory Visit: Payer: Self-pay | Admitting: Internal Medicine

## 2022-05-30 ENCOUNTER — Ambulatory Visit
Admission: EM | Admit: 2022-05-30 | Discharge: 2022-05-30 | Disposition: A | Discharge status: Home / Self Care | Payer: 59 | Attending: Internal Medicine | Admitting: Internal Medicine

## 2022-05-30 DIAGNOSIS — N3001 Acute cystitis with hematuria: Secondary | ICD-10-CM | POA: Insufficient documentation

## 2022-05-30 DIAGNOSIS — M545 Low back pain, unspecified: Secondary | ICD-10-CM | POA: Diagnosis not present

## 2022-05-30 LAB — POCT URINALYSIS DIP (MANUAL ENTRY)
Bilirubin, UA: NEGATIVE
Glucose, UA: NEGATIVE mg/dL
Ketones, POC UA: NEGATIVE mg/dL
Nitrite, UA: NEGATIVE
Protein Ur, POC: NEGATIVE mg/dL
Spec Grav, UA: 1.01 (ref 1.010–1.025)
Urobilinogen, UA: 0.2 E.U./dL
pH, UA: 6 (ref 5.0–8.0)

## 2022-05-30 MED ORDER — CEPHALEXIN 500 MG PO CAPS: 500.0000 mg | ORAL_CAPSULE | Freq: Four times a day (QID) | ORAL | 0 refills | Status: DC

## 2022-05-30 NOTE — Discharge Instructions (Signed)
It appears that your symptoms are due to a urinary tract infection.  You are being treated with cephalexin antibiotic.  Recommend monitoring your blood sugar more closely while on antibiotic given that you take metformin.  Urine culture is pending.  We will call with results.  Please follow-up if symptoms persist or worsen.

## 2022-05-30 NOTE — ED Provider Notes (Addendum)
[facility]    CSN: [id number] Arrival date & time: 05/30/22  1435      History   Chief Complaint Chief Complaint  Patient presents with   Back Pain    HPI Melissa [name] is a 61 y.o. female.   Patient presents with right lower back pain that has been present for approximately 5 days.  Denies any obvious injury to the area.  Patient reports that she does have a history of chronic back pain that intermittently flares up.  Denies any numbness or tingling.  Pain does not radiate.  She has concern for urinary tract infection given history of frequent urinary tract infections.  She denies any current urinary symptoms or vaginal discharge.  She states that she has been occasionally having some right lower abdominal pain as well that is mild and intermittent.  Denies fever, body aches, chills.   Back Pain   Past Medical History:  Diagnosis Date   Acute renal failure ([facility]) 06/11/2015   Allergy    sinus allergies   Chronic kidney disease (CKD), stage III (moderate) ([facility])    /notes 06/11/2015   Cirrhosis ([facility])    secondary to sarcoidosis   Diabetes mellitus without complication ([facility]) dx'd 06/11/2015   /notes 06/11/2015   Esophageal varices ([facility])    GERD (gastroesophageal reflux disease)    Heart murmur    Hypertension    Sarcoidosis     Patient Active Problem List   Diagnosis Date Noted   Diabetic hyperosmolar non-ketotic state (HCC) 06/29/2019   Low back pain 09/27/2017   Type 2 diabetes mellitus with hyperglycemia, with long-term current use of insulin (HCC) 12/19/2015   Hyperlipidemia 11/11/2015   History of esophageal varices 12/13/2014   HTN (hypertension) 05/07/2012   GERD (gastroesophageal reflux disease) 05/07/2012   AR (allergic rhinitis) 05/07/2012   BMI 40.0-44.9, adult (HCC) 05/07/2012   Hepatic cirrhosis (HCC) 11/25/2009   SARCOIDOSIS 11/10/2009   CARDIAC MURMUR 11/10/2009    Past Surgical History:  Procedure Laterality Date   ABDOMINAL  HYSTERECTOMY  1997   "partial"   CARPAL TUNNEL RELEASE Right 1987   CESAREAN SECTION  1979   FOOT SURGERY Bilateral 2018   REDUCTION MAMMAPLASTY Bilateral 2005   TUBAL LIGATION  1981    OB History   No obstetric history on file.      Home Medications    Prior to Admission medications   Medication Sig Start Date End Date Taking? Authorizing Provider  cephALEXin (KEFLEX) 500 MG capsule Take 1 capsule (500 mg total) by mouth 4 (four) times daily. 05/30/22  Yes Mound, Haley E, FNP  acetaminophen (TYLENOL) 500 MG tablet Take 1,000 mg by mouth every 6 (six) hours as needed for headache (pain).    [provider]  azelastine (ASTELIN) 0.1 % nasal spray Place 2 sprays into both nostrils 2 (two) times daily. Use in each nostril as directed Patient taking differently: Place 2 sprays into both nostrils 2 (two) times daily as needed for rhinitis or allergies. Use in each nostril as directed 09/27/17   Jeffery, Chelle, PA  blood glucose meter kit and supplies KIT Dispense based on patient and insurance preference. Use up to four times daily as directed. (FOR ICD-9 250.00, 250.01). 07/02/19   Ghimire, Kuber, MD  Blood Glucose Monitoring Suppl DEVI 1 each by Does not apply route 2 (two) times daily for 1 day. 07/02/19 07/03/19  Ghimire, Kuber, MD  cyclobenzaprine (FLEXERIL) 10 MG tablet Take 1 tablet (10 mg total)   by mouth 3 (three) times daily as needed for muscle spasms. Patient taking differently: Take 10 mg by mouth at bedtime as needed for muscle spasms (back pain).  09/27/17   Jeffery, Chelle, PA  famotidine (PEPCID) 20 MG tablet Take 1 tablet (20 mg total) by mouth as needed for heartburn or indigestion. 01/04/20   Armbruster, Steven P, MD  glucose blood (ACCU-CHEK GUIDE) test strip Use to check blood sugar 3 times a day 07/31/19   Gherghe, Cristina, MD  guaiFENesin (MUCINEX) 600 MG 12 hr tablet Take 600 mg by mouth 2 (two) times daily as needed for cough or to loosen phlegm.     [provider]  Insulin Pen Needle 32G X 4 MM MISC Use 1x a day 07/30/19   Gherghe, Cristina, MD  metFORMIN (GLUCOPHAGE) 500 MG tablet TAKE 1 TABLET (500 MG TOTAL) BY MOUTH 2 (TWO) TIMES DAILY WITH A MEAL. 08/05/21 09/04/21  Gherghe, Cristina, MD  nadolol (CORGARD) 40 MG tablet TAKE 1 TABLET (40 MG TOTAL) BY MOUTH DAILY Patient taking differently: Take 40 mg by mouth daily.  09/27/17   Jeffery, Chelle, PA  triamterene-hydrochlorothiazide (MAXZIDE-25) 37.5-25 MG tablet Take 1 tablet by mouth daily. 09/27/17   Jeffery, Chelle, PA    Family History Family History  Problem Relation Age of Onset   Hypertension Mother    Hypertension Brother    Kidney disease Brother        ESRD on Dialysis   Diabetes Brother    CAD Sister    Colon cancer Neg Hx    Esophageal cancer Neg Hx    Rectal cancer Neg Hx    Stomach cancer Neg Hx     Social History Social History   Tobacco Use   Smoking status: Never   Smokeless tobacco: Never  Vaping Use   Vaping Use: Never used  Substance Use Topics   Alcohol use: No    Alcohol/week: 0.0 standard drinks of alcohol   Drug use: No     Allergies   Codeine, Erythromycin, Ketek [telithromycin], Oxycodone-acetaminophen, Penicillins, Propoxyphene n-acetaminophen, Sulfonamide derivatives, and Vicodin [hydrocodone-acetaminophen]   Review of Systems Review of Systems Per HPI  Physical Exam Triage Vital Signs ED Triage Vitals  Enc Vitals Group     BP 05/30/22 1506 128/83     Pulse Rate 05/30/22 1506 71     Resp 05/30/22 1506 15     Temp 05/30/22 1506 98 F (36.7 C)     Temp src --      SpO2 05/30/22 1506 98 %     Weight --      Height --      Head Circumference --      Peak Flow --      Pain Score 05/30/22 1504 0     Pain Loc --      Pain Edu? --      Excl. in GC? --    No data found.  Updated Vital Signs BP 128/83   Pulse 71   Temp 98 F (36.7 C)   Resp 15   SpO2 98%   Visual Acuity Right Eye Distance:   Left Eye Distance:    Bilateral Distance:    Right Eye Near:   Left Eye Near:    Bilateral Near:     Physical Exam Constitutional:      General: She is not in acute distress.    Appearance: Normal appearance. She is not toxic-appearing or diaphoretic.  HENT:       Head: Normocephalic and atraumatic.  Eyes:     Extraocular Movements: Extraocular movements intact.     Conjunctiva/sclera: Conjunctivae normal.  Pulmonary:     Effort: Pulmonary effort is normal.  Abdominal:     General: Bowel sounds are normal. There is no distension.     Palpations: Abdomen is soft.     Tenderness: There is no abdominal tenderness.  Musculoskeletal:       Back:     Comments: No tenderness to palpation to back.  No direct spinal tenderness, crepitus, step-off.  Pain occurs at circled area on diagram per patient.  Neurological:     General: No focal deficit present.     Mental Status: She is alert and oriented to person, place, and time. Mental status is at baseline.     Deep Tendon Reflexes: Reflexes are normal and symmetric.  Psychiatric:        Mood and Affect: Mood normal.        Behavior: Behavior normal.        Thought Content: Thought content normal.        Judgment: Judgment normal.      UC Treatments / Results  Labs (all labs ordered are listed, but only abnormal results are displayed) Labs Reviewed  POCT URINALYSIS DIP (MANUAL ENTRY) - Abnormal; Notable for the following components:      Result Value   Blood, UA trace-intact (*)    Leukocytes, UA Small (1+) (*)    All other components within normal limits  URINE CULTURE    EKG   Radiology No results found.  Procedures Procedures (including critical care time)  Medications Ordered in UC Medications - No data to display  Initial Impression / Assessment and Plan / UC Course  I have reviewed the triage vital signs and the nursing notes.  Pertinent labs & imaging results that were available during my care of the patient were reviewed by me  and considered in my medical decision making (see chart for details).     Urinalysis has small amount of leukocytes which could indicate urinary tract infection.  Given no reproducible pain on exam to back, will opt to treat with cephalexin antibiotic for urinary tract infection.  Creatinine clearance is 58 so do not think that any dosage adjustment is necessary.  Urine culture is pending.  Discussed with patient the possibility that musculoskeletal pain could be a factor as well.  Advised of supportive care, ice application, rest.  No fever present and patient is stable so do not think that aggressive treatment or emergent evaluation at the hospital is necessary at this time.  Patient to follow-up if symptoms persist or worsen.  Patient verbalized understanding and was agreeable with plan. Final Clinical Impressions(s) / UC Diagnoses   Final diagnoses:  Acute cystitis with hematuria  Acute right-sided low back pain without sciatica     Discharge Instructions      It appears that your symptoms are due to a urinary tract infection.  You are being treated with cephalexin antibiotic.  Recommend monitoring your blood sugar more closely while on antibiotic given that you take metformin.  Urine culture is pending.  We will call with results.  Please follow-up if symptoms persist or worsen.    ED Prescriptions     Medication Sig Dispense Auth. Provider   cephALEXin (KEFLEX) 500 MG capsule Take 1 capsule (500 mg total) by mouth 4 (four) times daily. 28 capsule Mound, Haley E, FNP        PDMP not reviewed this encounter.   Teodora Medici, [place] 05/30/22 [place], [place], [place] 05/30/22 1540

## 2022-05-30 NOTE — ED Triage Notes (Signed)
Patient presents to Urgent Care with complaints of low back pain since 5 days ago. Patient reports not sure if musculoskeletal or not.

## 2022-06-01 LAB — URINE CULTURE: Culture: >=100000 — AB

## 2022-12-21 ENCOUNTER — Encounter: Payer: Self-pay | Admitting: Gastroenterology

## 2023-10-14 ENCOUNTER — Other Ambulatory Visit: Payer: Self-pay

## 2023-10-14 ENCOUNTER — Ambulatory Visit
Admission: EM | Admit: 2023-10-14 | Discharge: 2023-10-14 | Disposition: A | Discharge status: Home / Self Care | Payer: 59 | Attending: Internal Medicine | Admitting: Internal Medicine

## 2023-10-14 DIAGNOSIS — M545 Low back pain, unspecified: Secondary | ICD-10-CM | POA: Diagnosis not present

## 2023-10-14 DIAGNOSIS — R102 Pelvic and perineal pain: Secondary | ICD-10-CM | POA: Diagnosis not present

## 2023-10-14 LAB — POCT URINALYSIS DIP (MANUAL ENTRY)
Bilirubin, UA: NEGATIVE
Glucose, UA: 500 mg/dL — AB
Ketones, POC UA: NEGATIVE mg/dL
Leukocytes, UA: NEGATIVE
Nitrite, UA: NEGATIVE
Protein Ur, POC: NEGATIVE mg/dL
Spec Grav, UA: 1.015 (ref 1.010–1.025)
Urobilinogen, UA: 0.2 U/dL
pH, UA: 6 (ref 5.0–8.0)

## 2023-10-14 LAB — POCT FASTING CBG KUC MANUAL ENTRY: POCT Glucose (KUC): 164 mg/dL — AB (ref 70–99)

## 2023-10-14 NOTE — ED Provider Notes (Signed)
[facility]    CSN: [id number] Arrival date & time: 10/14/23  1717      History   Chief Complaint No chief complaint on file.   HPI Melissa [name] is a 62 y.o. female.   Patient presents today with concern of urinary tract infection given that she has been having some lower back pain and bilateral pelvis/upper thigh pain.  Reports it has been present for approximately 1 week and started after she started lifting some heavy boxes moving her belongings to a storage unit.  She denies dysuria, urinary frequency, hematuria, fever, chills.  Reports that she also has a history of chronic back pain and this feels similar.  She has not taken any medication for her symptoms.     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   Diabetes mellitus without complication (HCC) dx'd 06/11/2015   [name] 06/11/2015   Esophageal varices (HCC)    GERD (gastroesophageal reflux disease)    Heart murmur    Hypertension    Sarcoidosis     Patient Active Problem List   Diagnosis Date Noted   Diabetic hyperosmolar non-ketotic state (HCC) 06/29/2019   Low back pain 09/27/2017   Type 2 diabetes mellitus with hyperglycemia, with long-term current use of insulin (HCC) 12/19/2015   Hyperlipidemia 11/11/2015   History of esophageal varices 12/13/2014   HTN (hypertension) 05/07/2012   GERD (gastroesophageal reflux disease) 05/07/2012   AR (allergic rhinitis) 05/07/2012   BMI 40.0-44.9, adult (HCC) 05/07/2012   Hepatic cirrhosis (HCC) 11/25/2009   SARCOIDOSIS 11/10/2009   CARDIAC MURMUR 11/10/2009    Past Surgical History:  Procedure Laterality Date   ABDOMINAL HYSTERECTOMY  1997   "partial"   CARPAL TUNNEL RELEASE Right 1987   CESAREAN SECTION  1979   FOOT SURGERY Bilateral 2018   REDUCTION  MAMMAPLASTY Bilateral 2005   TUBAL LIGATION  1981    OB History   No obstetric history on file.      Home Medications    Prior to Admission medications   Medication Sig Start Date End Date Taking? Authorizing Provider  acetaminophen (TYLENOL) 500 MG tablet Take 1,000 mg by mouth every 6 (six) hours as needed for headache (pain).    [provider]  azelastine (ASTELIN) 0.1 % nasal spray Place 2 sprays into both nostrils 2 (two) times daily. Use in each nostril as directed Patient taking differently: Place 2 sprays into both nostrils 2 (two) times daily as needed for rhinitis or allergies. Use in each nostril as directed 09/27/17   Porfirio Oar, PA  blood glucose meter kit and supplies KIT Dispense based on patient and insurance preference. Use up to four times daily as directed. (FOR ICD-9 250.00, 250.01). 07/02/19   Dorcas Carrow, MD  Blood Glucose Monitoring Suppl DEVI 1 each by Does not apply route 2 (two) times daily for 1 day. 07/02/19 07/03/19  Dorcas Carrow, MD  cephALEXin (KEFLEX) 500 MG capsule Take 1 capsule (500 mg total) by mouth 4 (four) times daily. 05/30/22   Gustavus Bryant, FNP  cyclobenzaprine (FLEXERIL) 10 MG tablet Take 1 tablet (10 mg total) by mouth 3 (three) times daily as needed for muscle spasms. Patient taking differently: Take 10 mg by mouth at bedtime as needed for muscle spasms (  back pain).  09/27/17   Porfirio Oar, PA  famotidine (PEPCID) 20 MG tablet Take 1 tablet (20 mg total) by mouth as needed for heartburn or indigestion. 01/04/20   Armbruster, Willaim Rayas, MD  glucose blood (ACCU-CHEK GUIDE) test strip Use to check blood sugar 3 times a day 07/31/19   Carlus Pavlov, MD  guaiFENesin (MUCINEX) 600 MG 12 hr tablet Take 600 mg by mouth 2 (two) times daily as needed for cough or to loosen phlegm.     [provider]  Insulin Pen Needle 32G X 4 MM MISC Use 1x a day 07/30/19   Carlus Pavlov, MD  metFORMIN (GLUCOPHAGE) 500 MG tablet TAKE 1 TABLET  (500 MG TOTAL) BY MOUTH 2 (TWO) TIMES DAILY WITH A MEAL. 08/05/21 09/04/21  Carlus Pavlov, MD  nadolol (CORGARD) 40 MG tablet TAKE 1 TABLET (40 MG TOTAL) BY MOUTH DAILY Patient taking differently: Take 40 mg by mouth daily.  09/27/17   Porfirio Oar, PA  triamterene-hydrochlorothiazide (MAXZIDE-25) 37.5-25 MG tablet Take 1 tablet by mouth daily. 09/27/17   Porfirio Oar, PA    Family History Family History  Problem Relation Age of Onset   Hypertension Mother    Hypertension Brother    Kidney disease Brother        ESRD on Dialysis   Diabetes Brother    CAD Sister    Colon cancer Neg Hx    Esophageal cancer Neg Hx    Rectal cancer Neg Hx    Stomach cancer Neg Hx     Social History Social History   Tobacco Use   Smoking status: Never   Smokeless tobacco: Never  Vaping Use   Vaping status: Never Used  Substance Use Topics   Alcohol use: No    Alcohol/week: 0.0 standard drinks of alcohol   Drug use: No     Allergies   Codeine, Erythromycin, Ketek [telithromycin], Oxycodone-acetaminophen, Penicillins, Propoxyphene n-acetaminophen, Sulfonamide derivatives, and Vicodin [hydrocodone-acetaminophen]   Review of Systems Review of Systems Per HPI  Physical Exam Triage Vital Signs ED Triage Vitals  Encounter Vitals Group     BP 10/14/23 1743 121/75     Systolic BP Percentile --      Diastolic BP Percentile --      Pulse Rate 10/14/23 1743 72     Resp 10/14/23 1743 16     Temp 10/14/23 1743 98.7 F (37.1 C)     Temp Source 10/14/23 1743 Oral     SpO2 10/14/23 1743 100 %     Weight 10/14/23 1741 180 lb (81.6 kg)     Height 10/14/23 1741 5' 2.5" (1.588 m)     Head Circumference --      Peak Flow --      Pain Score 10/14/23 1741 0     Pain Loc --      Pain Education --      Exclude from Growth Chart --    No data found.  Updated Vital Signs BP 121/75 (BP Location: Left Arm)   Pulse 72   Temp 98.7 F (37.1 C) (Oral)   Resp 16   Ht 5' 2.5" (1.588 m)   Wt  180 lb (81.6 kg)   SpO2 100%   BMI 32.40 kg/m   Visual Acuity Right Eye Distance:   Left Eye Distance:   Bilateral Distance:    Right Eye Near:   Left Eye Near:    Bilateral Near:     Physical Exam Constitutional:  General: She is not in acute distress.    Appearance: Normal appearance. She is not toxic-appearing or diaphoretic.  HENT:     Head: Normocephalic and atraumatic.  Eyes:     Extraocular Movements: Extraocular movements intact.     Conjunctiva/sclera: Conjunctivae normal.  Pulmonary:     Effort: Pulmonary effort is normal.  Musculoskeletal:     Comments: No tenderness to palpation to back, abdomen, bilateral lower extremities.  Patient is able to ambulate and stand without difficulty. No swelling or discoloration noted to affected areas.   Neurological:     General: No focal deficit present.     Mental Status: She is alert and oriented to person, place, and time. Mental status is at baseline.     Deep Tendon Reflexes: Reflexes are normal and symmetric.  Psychiatric:        Mood and Affect: Mood normal.        Behavior: Behavior normal.        Thought Content: Thought content normal.        Judgment: Judgment normal.      UC Treatments / Results  Labs (all labs ordered are listed, but only abnormal results are displayed) Labs Reviewed  POCT URINALYSIS DIP (MANUAL ENTRY) - Abnormal; Notable for the following components:      Result Value   Glucose, UA =500 (*)    Blood, UA trace-lysed (*)    All other components within normal limits  POCT FASTING CBG [facility] MANUAL ENTRY - Abnormal; Notable for the following components:   POCT Glucose ([facility]) 164 (*)    All other components within normal limits    EKG   Radiology No results found.  Procedures Procedures (including critical care time)  Medications Ordered in UC Medications - No data to display  Initial Impression / Assessment and Plan / UC Course  I have reviewed the triage vital signs and the  nursing notes.  Pertinent labs & imaging results that were available during my care of the patient were reviewed by me and considered in my medical decision making (see chart for details).     UA unremarkable for signs of infection.  It does show glucose.  Glucose today was 160s.  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  She does not routinely check her blood sugar but reports that she has been eating a lot of increased sugary foods recently.  Advised patient to diligently monitor her blood sugar over the next few days and follow-up with PCP for further evaluation and management of blood glucose and current symptoms.  There are no obvious signs of acute abdomen on exam that would warrant imaging or emergent evaluation. Given no injury or direct spinal tenderness, will defer x-ray imaging.  Suspect musculoskeletal pain given patient reports this feels similar to pain in the past.  She has muscle relaxer at home so encouraged her to use this as needed and as prescribed and safe over-the-counter pain relievers.  Advised strict follow-up precautions.  Patient verbalized understanding and was agreeable with plan. Final Clinical Impressions(s) / UC Diagnoses   Final diagnoses:  Midline low back pain without sciatica, unspecified chronicity  Pelvic pain     Discharge Instructions      Blood sugar is mildly elevated.  Recommend that you keep an eye on this at home with a home blood sugar monitor.  Follow-up with PCP.  Go to the ER if symptoms persist or worsen.    ED  Prescriptions   None    PDMP not reviewed this encounter.   Gustavus Bryant, [place] 10/14/23 [phone]

## 2023-10-14 NOTE — ED Triage Notes (Signed)
Patient reports left side and low back pain x 1 week. States "I think I have a UTI."

## 2023-10-14 NOTE — Discharge Instructions (Signed)
Blood sugar is mildly elevated.  Recommend that you keep an eye on this at home with a home blood sugar monitor.  Follow-up with PCP.  Go to the ER if symptoms persist or worsen.

## 2023-11-12 ENCOUNTER — Ambulatory Visit
Admission: EM | Admit: 2023-11-12 | Discharge: 2023-11-12 | Disposition: A | Discharge status: Home / Self Care | Payer: 59 | Attending: Family Medicine | Admitting: Family Medicine

## 2023-11-12 ENCOUNTER — Encounter: Payer: Self-pay | Admitting: Emergency Medicine

## 2023-11-12 DIAGNOSIS — J029 Acute pharyngitis, unspecified: Secondary | ICD-10-CM

## 2023-11-12 DIAGNOSIS — H9202 Otalgia, left ear: Secondary | ICD-10-CM | POA: Diagnosis not present

## 2023-11-12 MED ORDER — CEFDINIR 300 MG PO CAPS: 300.0000 mg | ORAL_CAPSULE | Freq: Two times a day (BID) | ORAL | 0 refills | Status: DC

## 2023-11-12 NOTE — ED Triage Notes (Signed)
Pt presents with sinus pressure and sore throat x 1 week. Today she developed right ear pain and dizziness.

## 2023-11-16 NOTE — ED Provider Notes (Signed)
[facility]   [id number] 11/12/23 Arrival Time: 1401  ASSESSMENT & PLAN:  1. Sore throat   2. Otalgia, left     No signs of peritonsillar abscess.  Appears to be developing ear infection on LEFT. Begin: Meds ordered this encounter  Medications   cefdinir (OMNICEF) 300 MG capsule    Sig: Take 1 capsule (300 mg total) by mouth 2 (two) times daily.    Dispense:  20 capsule    Refill:  0   May f/u as needed.  Reviewed expectations re: course of current medical issues. Questions answered. Outlined signs and symptoms indicating need for more acute intervention. Patient verbalized understanding. After Visit Summary given.   SUBJECTIVE:  Melissa [name] is a 62 y.o. female who reports a sore throat. Pt presents with sinus pressure and sore throat x 1 week. Today she developed left ear pain and dizziness.  Dizziness short-lived and has not recurred; mild vertigo. Denies HA. Unsure of temp. No tx PTA.  ROS: As per HPI.   OBJECTIVE:  Vitals:   11/12/23 1521  BP: (!) 142/82  Pulse: 74  Resp: 20  Temp: 98.3 F (36.8 C)  TempSrc: Oral  SpO2: 95%     General appearance: alert; no distress HEENT: throat with mild erythema and cobblestoning; uvula is midline; L TM erythema/bulging Neck: supple with FROM; no lymphadenopathy Lungs: speaks full sentences without difficulty; unlabored Abd: soft; non-tender Skin: reveals no rash; warm and dry Psychological: alert and cooperative; normal mood and affect  Allergies  Allergen Reactions   Codeine Nausea And Vomiting   Erythromycin Nausea And Vomiting   Ketek [Telithromycin] Nausea And Vomiting   Oxycodone-Acetaminophen Nausea And Vomiting   Penicillins Nausea And Vomiting    Did it involve swelling of the face/tongue/throat, SOB, or low BP? No Did it involve sudden or severe rash/hives, skin peeling, or any reaction on the inside of your mouth or nose? No Did you need to seek medical attention at a hospital  or doctor's office? No When did it last happen?  teenager     If all above answers are "NO", may proceed with cephalosporin use.   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   Diabetes mellitus without complication (HCC) dx'd 06/11/2015   [name] 06/11/2015   Esophageal varices (HCC)    GERD (gastroesophageal reflux disease)    Heart murmur    Hypertension    Sarcoidosis    Social History   Socioeconomic History   Marital status: Married    Spouse name: Keshia Hoek, Montez Hageman   Number of children: 1   Years of education: 14+   Highest education level: Not on file  Occupational History   Occupation: Risk manager    Comment: Adult Medicare  Tobacco Use   Smoking status: Never   Smokeless tobacco: Never  Vaping Use   Vaping status: Never Used  Substance and Sexual Activity   Alcohol use: No    Alcohol/week: 0.0 standard drinks of alcohol   Drug use: No   Sexual activity: Yes    Partners: Male    Birth control/protection: Surgical  Other Topics Concern  Not on file  Social History Narrative   Lives with her husband and two dogs. Her son is a Veterinary surgeon in [place], [place]. She feels like the grandmother to her sister's 2 children, since the sister's sudden and unexpected death in 06-26-2014.   Social Drivers of Corporate investment banker Strain: Low Risk  (12/20/2022)   Received from [facility], [facility]   Overall Financial Resource Strain (CARDIA)    Difficulty of Paying Living Expenses: Not hard at all  Food Insecurity: No Food Insecurity (12/20/2022)   Received from [facility], [facility]   Hunger Vital Sign     Worried About Running Out of Food in the Last Year: Never true    Ran Out of Food in the Last Year: Never true  Transportation Needs: No Transportation Needs (12/20/2022)   Received from [facility], [facility]   PRAPARE - Transportation    Lack of Transportation (Medical): No    Lack of Transportation (Non-Medical): No  Physical Activity: Sufficiently Active (10/05/2021)   Received from [facility], [facility]   Exercise Vital Sign    Days of Exercise per Week: 5 days    Minutes of Exercise per Session: 60 min  Stress: No Stress Concern Present (10/05/2021)   Received from [facility], [facility] - Occupational Stress Questionnaire    Feeling of Stress : Not at all  Social Connections: Unknown (04/12/2022)   Received from [facility], [facility]   Social Network    Social Network: Not on file  Intimate Partner Violence: Unknown (03/04/2022)   Received from [facility], [facility]   HITS    Physically Hurt: Not on file    Insult or Talk Down To: Not on file    Threaten Physical Harm: Not on file    Scream or Curse: Not on file   Family History  Problem Relation Age of Onset   Hypertension Mother    Hypertension Brother    Kidney disease Brother        ESRD on Dialysis   Diabetes Brother    CAD Sister    Colon cancer Neg Hx    Esophageal cancer Neg Hx    Rectal cancer Neg Hx    Stomach cancer Neg Hx            Mardella Layman, MD 11/16/23 1038

## 2023-11-23 ENCOUNTER — Encounter (HOSPITAL_COMMUNITY): Payer: Self-pay

## 2023-11-23 ENCOUNTER — Other Ambulatory Visit: Payer: Self-pay

## 2023-11-23 ENCOUNTER — Inpatient Hospital Stay (HOSPITAL_COMMUNITY): Payer: 59

## 2023-11-23 ENCOUNTER — Inpatient Hospital Stay (HOSPITAL_COMMUNITY)
Admission: EM | Admit: 2023-11-23 | Discharge: 2023-11-27 | DRG: 682 | Disposition: A | Discharge status: Home / Self Care | Payer: 59 | Attending: Internal Medicine | Admitting: Internal Medicine

## 2023-11-23 DIAGNOSIS — E1122 Type 2 diabetes mellitus with diabetic chronic kidney disease: Secondary | ICD-10-CM | POA: Diagnosis present

## 2023-11-23 DIAGNOSIS — B370 Candidal stomatitis: Secondary | ICD-10-CM | POA: Diagnosis present

## 2023-11-23 DIAGNOSIS — E861 Hypovolemia: Secondary | ICD-10-CM | POA: Diagnosis present

## 2023-11-23 DIAGNOSIS — E871 Hypo-osmolality and hyponatremia: Secondary | ICD-10-CM | POA: Diagnosis present

## 2023-11-23 DIAGNOSIS — Z888 Allergy status to other drugs, medicaments and biological substances status: Secondary | ICD-10-CM

## 2023-11-23 DIAGNOSIS — Z881 Allergy status to other antibiotic agents status: Secondary | ICD-10-CM

## 2023-11-23 DIAGNOSIS — Z885 Allergy status to narcotic agent status: Secondary | ICD-10-CM

## 2023-11-23 DIAGNOSIS — E1100 Type 2 diabetes mellitus with hyperosmolarity without nonketotic hyperglycemic-hyperosmolar coma (NKHHC): Secondary | ICD-10-CM | POA: Diagnosis present

## 2023-11-23 DIAGNOSIS — I10 Essential (primary) hypertension: Secondary | ICD-10-CM | POA: Diagnosis present

## 2023-11-23 DIAGNOSIS — K7469 Other cirrhosis of liver: Secondary | ICD-10-CM | POA: Diagnosis present

## 2023-11-23 DIAGNOSIS — I129 Hypertensive chronic kidney disease with stage 1 through stage 4 chronic kidney disease, or unspecified chronic kidney disease: Secondary | ICD-10-CM | POA: Diagnosis present

## 2023-11-23 DIAGNOSIS — D8689 Sarcoidosis of other sites: Secondary | ICD-10-CM | POA: Diagnosis present

## 2023-11-23 DIAGNOSIS — Z833 Family history of diabetes mellitus: Secondary | ICD-10-CM

## 2023-11-23 DIAGNOSIS — Z7984 Long term (current) use of oral hypoglycemic drugs: Secondary | ICD-10-CM

## 2023-11-23 DIAGNOSIS — Z880 Allergy status to penicillin: Secondary | ICD-10-CM | POA: Diagnosis not present

## 2023-11-23 DIAGNOSIS — Z7982 Long term (current) use of aspirin: Secondary | ICD-10-CM

## 2023-11-23 DIAGNOSIS — N179 Acute kidney failure, unspecified: Secondary | ICD-10-CM | POA: Diagnosis present

## 2023-11-23 DIAGNOSIS — Z8249 Family history of ischemic heart disease and other diseases of the circulatory system: Secondary | ICD-10-CM

## 2023-11-23 DIAGNOSIS — Z79899 Other long term (current) drug therapy: Secondary | ICD-10-CM | POA: Diagnosis not present

## 2023-11-23 DIAGNOSIS — Z841 Family history of disorders of kidney and ureter: Secondary | ICD-10-CM

## 2023-11-23 DIAGNOSIS — Z8719 Personal history of other diseases of the digestive system: Secondary | ICD-10-CM

## 2023-11-23 DIAGNOSIS — E876 Hypokalemia: Secondary | ICD-10-CM | POA: Diagnosis present

## 2023-11-23 DIAGNOSIS — N1832 Chronic kidney disease, stage 3b: Secondary | ICD-10-CM | POA: Diagnosis present

## 2023-11-23 DIAGNOSIS — E860 Dehydration: Secondary | ICD-10-CM | POA: Diagnosis present

## 2023-11-23 DIAGNOSIS — Z91148 Patient's other noncompliance with medication regimen for other reason: Secondary | ICD-10-CM

## 2023-11-23 DIAGNOSIS — E785 Hyperlipidemia, unspecified: Secondary | ICD-10-CM | POA: Diagnosis present

## 2023-11-23 DIAGNOSIS — E1101 Type 2 diabetes mellitus with hyperosmolarity with coma: Secondary | ICD-10-CM | POA: Diagnosis not present

## 2023-11-23 DIAGNOSIS — R739 Hyperglycemia, unspecified: Secondary | ICD-10-CM

## 2023-11-23 DIAGNOSIS — Z90711 Acquired absence of uterus with remaining cervical stump: Secondary | ICD-10-CM

## 2023-11-23 DIAGNOSIS — K219 Gastro-esophageal reflux disease without esophagitis: Secondary | ICD-10-CM | POA: Diagnosis present

## 2023-11-23 DIAGNOSIS — Z882 Allergy status to sulfonamides status: Secondary | ICD-10-CM | POA: Diagnosis not present

## 2023-11-23 DIAGNOSIS — K7460 Unspecified cirrhosis of liver: Secondary | ICD-10-CM | POA: Diagnosis present

## 2023-11-23 LAB — CBC
HCT: 46.6 % — ABNORMAL HIGH (ref 36.0–46.0)
Hemoglobin: 16 g/dL — ABNORMAL HIGH (ref 12.0–15.0)
MCH: 28.7 pg (ref 26.0–34.0)
MCHC: 34.3 g/dL (ref 30.0–36.0)
MCV: 83.7 fL (ref 80.0–100.0)
Platelets: 219 10*3/uL (ref 150–400)
RBC: 5.57 MIL/uL — ABNORMAL HIGH (ref 3.87–5.11)
RDW: 13.4 % (ref 11.5–15.5)
WBC: 7.3 10*3/uL (ref 4.0–10.5)
nRBC: 0 % (ref 0.0–0.2)

## 2023-11-23 LAB — CBC WITH DIFFERENTIAL/PLATELET
Abs Immature Granulocytes: 0.02 10*3/uL (ref 0.00–0.07)
Basophils Absolute: 0.1 10*3/uL (ref 0.0–0.1)
Basophils Relative: 1 %
Eosinophils Absolute: 0.2 10*3/uL (ref 0.0–0.5)
Eosinophils Relative: 3 %
HCT: 49.7 % — ABNORMAL HIGH (ref 36.0–46.0)
Hemoglobin: 16.9 g/dL — ABNORMAL HIGH (ref 12.0–15.0)
Immature Granulocytes: 0 %
Lymphocytes Relative: 15 %
Lymphs Abs: 1.3 10*3/uL (ref 0.7–4.0)
MCH: 28.6 pg (ref 26.0–34.0)
MCHC: 34 g/dL (ref 30.0–36.0)
MCV: 84.2 fL (ref 80.0–100.0)
Monocytes Absolute: 0.7 10*3/uL (ref 0.1–1.0)
Monocytes Relative: 8 %
Neutro Abs: 6.5 10*3/uL (ref 1.7–7.7)
Neutrophils Relative %: 73 %
Platelets: 226 10*3/uL (ref 150–400)
RBC: 5.9 MIL/uL — ABNORMAL HIGH (ref 3.87–5.11)
RDW: 13.5 % (ref 11.5–15.5)
WBC: 8.8 10*3/uL (ref 4.0–10.5)
nRBC: 0 % (ref 0.0–0.2)

## 2023-11-23 LAB — CBG MONITORING, ED
Glucose-Capillary: 444 mg/dL — ABNORMAL HIGH (ref 70–99)
Glucose-Capillary: 410 mg/dL — ABNORMAL HIGH (ref 70–99)

## 2023-11-23 LAB — BLOOD GAS, VENOUS
Acid-Base Excess: 5.8 mmol/L — ABNORMAL HIGH (ref 0.0–2.0)
Bicarbonate: 32.7 mmol/L — ABNORMAL HIGH (ref 20.0–28.0)
O2 Saturation: 27.8 %
Patient temperature: 36.4
pCO2, Ven: 53 mm[Hg] (ref 44–60)
pH, Ven: 7.4 (ref 7.25–7.43)
pO2, Ven: <31 mm[Hg] — CL (ref 32–45)

## 2023-11-23 LAB — COMPREHENSIVE METABOLIC PANEL
ALT: 32 U/L (ref 0–44)
AST: 61 U/L — ABNORMAL HIGH (ref 15–41)
Albumin: 3.7 g/dL (ref 3.5–5.0)
Alkaline Phosphatase: 134 U/L — ABNORMAL HIGH (ref 38–126)
Anion gap: 17 — ABNORMAL HIGH (ref 5–15)
BUN: 52 mg/dL — ABNORMAL HIGH (ref 8–23)
CO2: 22 mmol/L (ref 22–32)
Calcium: 10.2 mg/dL (ref 8.9–10.3)
Chloride: 91 mmol/L — ABNORMAL LOW (ref 98–111)
Creatinine, Ser: 2.99 mg/dL — ABNORMAL HIGH (ref 0.44–1.00)
GFR, Estimated: 17 mL/min — ABNORMAL LOW (ref >60)
Glucose, Bld: 471 mg/dL — ABNORMAL HIGH (ref 70–99)
Potassium: 3.7 mmol/L (ref 3.5–5.1)
Sodium: 130 mmol/L — ABNORMAL LOW (ref 135–145)
Total Bilirubin: 1.9 mg/dL — ABNORMAL HIGH (ref <1.2)
Total Protein: 8.7 g/dL — ABNORMAL HIGH (ref 6.5–8.1)

## 2023-11-23 LAB — URINALYSIS, ROUTINE W REFLEX MICROSCOPIC
Bilirubin Urine: NEGATIVE
Glucose, UA: >=500 mg/dL — AB
Hgb urine dipstick: NEGATIVE
Ketones, ur: NEGATIVE mg/dL
Leukocytes,Ua: NEGATIVE
Nitrite: NEGATIVE
Protein, ur: NEGATIVE mg/dL
Specific Gravity, Urine: 1.024 (ref 1.005–1.030)
pH: 6 (ref 5.0–8.0)

## 2023-11-23 LAB — LACTIC ACID, PLASMA
Lactic Acid, Venous: 2.6 mmol/L (ref 0.5–1.9)
Lactic Acid, Venous: 2.2 mmol/L (ref 0.5–1.9)

## 2023-11-23 LAB — BETA-HYDROXYBUTYRIC ACID: Beta-Hydroxybutyric Acid: 0.78 mmol/L — ABNORMAL HIGH (ref 0.05–0.27)

## 2023-11-23 LAB — GLUCOSE, CAPILLARY
Glucose-Capillary: 380 mg/dL — ABNORMAL HIGH (ref 70–99)
Glucose-Capillary: 253 mg/dL — ABNORMAL HIGH (ref 70–99)

## 2023-11-23 LAB — CREATININE, SERUM
Creatinine, Ser: 2.6 mg/dL — ABNORMAL HIGH (ref 0.44–1.00)
GFR, Estimated: 20 mL/min — ABNORMAL LOW (ref >60)

## 2023-11-23 LAB — HIV ANTIBODY (ROUTINE TESTING W REFLEX): HIV Screen 4th Generation wRfx: NONREACTIVE

## 2023-11-23 MED ORDER — ONDANSETRON HCL 4 MG/2ML IJ SOLN: 4.0000 mg | Freq: Four times a day (QID) | INTRAMUSCULAR | Status: DC | PRN

## 2023-11-23 MED ORDER — FAMOTIDINE 20 MG PO TABS: 20.0000 mg | ORAL_TABLET | Freq: Every day | ORAL | Status: DC | PRN

## 2023-11-23 MED ORDER — ONDANSETRON HCL 4 MG PO TABS: 4.0000 mg | ORAL_TABLET | Freq: Four times a day (QID) | ORAL | Status: DC | PRN

## 2023-11-23 MED ORDER — INSULIN ASPART 100 UNIT/ML IJ SOLN
15.0000 [IU] | Freq: Once | INTRAMUSCULAR | Status: DC
  Administered 2023-11-23: 15 [IU] via SUBCUTANEOUS

## 2023-11-23 MED ORDER — INSULIN ASPART 100 UNIT/ML IJ SOLN
0.0000 [IU] | Freq: Three times a day (TID) | INTRAMUSCULAR | Status: DC
  Administered 2023-11-24: 8 [IU] via SUBCUTANEOUS
  Administered 2023-11-24: 5 [IU] via SUBCUTANEOUS
  Administered 2023-11-24: 11 [IU] via SUBCUTANEOUS
  Administered 2023-11-25: 2 [IU] via SUBCUTANEOUS
  Administered 2023-11-25: 5 [IU] via SUBCUTANEOUS
  Administered 2023-11-25: 3 [IU] via SUBCUTANEOUS
  Administered 2023-11-26 (×2): 2 [IU] via SUBCUTANEOUS
  Administered 2023-11-26: 5 [IU] via SUBCUTANEOUS

## 2023-11-23 MED ORDER — INSULIN ASPART 100 UNIT/ML IJ SOLN
0.0000 [IU] | Freq: Every day | INTRAMUSCULAR | Status: DC
  Administered 2023-11-23: 3 [IU] via SUBCUTANEOUS
  Administered 2023-11-24: 4 [IU] via SUBCUTANEOUS
  Administered 2023-11-25 – 2023-11-26 (×2): 2 [IU] via SUBCUTANEOUS

## 2023-11-23 MED ORDER — SODIUM CHLORIDE 0.9 % IV SOLN
INTRAVENOUS | Status: DC
Start: 2023-11-23 — End: 2023-11-24

## 2023-11-23 MED ORDER — INSULIN ASPART 100 UNIT/ML IJ SOLN: 0.0000 [IU] | Freq: Three times a day (TID) | INTRAMUSCULAR | Status: DC

## 2023-11-23 MED ORDER — SODIUM CHLORIDE 0.9 % IV BOLUS
1000.0000 mL | Freq: Once | INTRAVENOUS | Status: AC
  Administered 2023-11-23: 1000 mL via INTRAVENOUS

## 2023-11-23 MED ORDER — NADOLOL 40 MG PO TABS
40.0000 mg | ORAL_TABLET | Freq: Every day | ORAL | Status: DC
  Administered 2023-11-24 – 2023-11-27 (×4): 40 mg via ORAL
  Filled 2023-11-23 (×4): qty 1

## 2023-11-23 MED ORDER — INSULIN ASPART 100 UNIT/ML IJ SOLN: 8.0000 [IU] | Freq: Once | INTRAMUSCULAR | Status: DC

## 2023-11-23 MED ORDER — ROSUVASTATIN CALCIUM 5 MG PO TABS: 10.0000 mg | ORAL_TABLET | Freq: Every day | ORAL | Status: DC

## 2023-11-23 MED ORDER — HEPARIN SODIUM (PORCINE) 5000 UNIT/ML IJ SOLN
5000.0000 [IU] | Freq: Three times a day (TID) | INTRAMUSCULAR | Status: DC
Start: 2023-11-23 — End: 2023-11-27
  Administered 2023-11-23 – 2023-11-27 (×11): 5000 [IU] via SUBCUTANEOUS
  Filled 2023-11-23 (×11): qty 1

## 2023-11-23 MED ORDER — INSULIN ASPART 100 UNIT/ML IJ SOLN: 0.0000 [IU] | Freq: Every day | INTRAMUSCULAR | Status: DC

## 2023-11-23 MED ORDER — INSULIN ASPART 100 UNIT/ML IJ SOLN
8.0000 [IU] | Freq: Once | INTRAMUSCULAR | Status: AC
  Administered 2023-11-23: 8 [IU] via SUBCUTANEOUS

## 2023-11-23 MED ORDER — LACTATED RINGERS IV BOLUS
2000.0000 mL | Freq: Once | INTRAVENOUS | Status: AC
  Administered 2023-11-23: 2000 mL via INTRAVENOUS

## 2023-11-23 NOTE — H&P (Addendum)
History and Physical    Melissa [name] OZH:[id number] DOB: 1961/05/19 DOA: 11/23/2023  PCP: Porfirio Oar, PA  Patient coming from: Home  I have personally briefly reviewed patient's old medical records in [facility]  Chief Complaint: Elevated blood sugar  HPI: Melissa [name] is a 62 y.o. female with medical history significant of type 2 diabetes, hypertension, hyperlipidemia, cirrhosis, CKD present here with concern of elevated blood sugar and 400s.  Reports that she recently diagnosed with sinusitis and was taking antibiotics for the past 5 days however she have not been feeling great, has weakness, nausea and poor appetite and poor p.o. intake.  Her spouse checked her blood sugar this morning was over 400 therefore she brought to the ER for further eval and management.  In ED: Patient afebrile, pulse: 65, RR: 18, BP: 102/70.  BG: 444, no leukocytosis, H&H 16.9/49.7, PLT: 226.  NA: 130, BUN: 52, creatinine 2.99, GFR: 17, AST: 61, anion gap: 17.  She was an IV fluid bolus x 2 in the ED and 8 units of NovoLog and repeat blood sugar continues to be in 400s.  CT renal stone study ordered and hospitalist consulted for admission due to AKI and hyperglycemia.   Review of Systems: As per HPI otherwise negative.    Past Medical History:  Diagnosis Date   Acute renal failure (HCC) 06/11/2015   Allergy    sinus allergies   Chronic kidney disease (CKD), stage III (moderate) (HCC)    Melissa [name] 06/11/2015   Cirrhosis (HCC)    secondary to sarcoidosis   Diabetes mellitus without complication (HCC) dx'd 06/11/2015   Melissa [name] 06/11/2015   Esophageal varices (HCC)    GERD (gastroesophageal reflux disease)    Heart murmur    Hypertension    Sarcoidosis     Past Surgical History:  Procedure Laterality Date   ABDOMINAL HYSTERECTOMY  1997   "partial"   CARPAL TUNNEL RELEASE Right 1987   CESAREAN SECTION  1979   FOOT SURGERY Bilateral 2018   REDUCTION MAMMAPLASTY Bilateral 2005    TUBAL LIGATION  1981     reports that she has never smoked. She has never used smokeless tobacco. She reports that she does not drink alcohol and does not use drugs.  Allergies  Allergen Reactions   Codeine Nausea And Vomiting   Erythromycin Nausea And Vomiting   Ketek [Telithromycin] Nausea And Vomiting   Oxycodone-Acetaminophen Nausea And Vomiting   Penicillins Nausea And Vomiting    Did it involve swelling of the face/tongue/throat, SOB, or low BP? No Did it involve sudden or severe rash/hives, skin peeling, or any reaction on the inside of your mouth or nose? No Did you need to seek medical attention at a hospital or doctor's office? No When did it last happen?  teenager     If all above answers are "NO", may proceed with cephalosporin use.   Propoxyphene N-Acetaminophen Nausea And Vomiting   Sulfonamide Derivatives Nausea And Vomiting   Vicodin [Hydrocodone-Acetaminophen] Nausea And Vomiting    Family History  Problem Relation Age of Onset   Hypertension Mother    Hypertension Brother    Kidney disease Brother        ESRD on Dialysis   Diabetes Brother    CAD Sister    Colon cancer Neg Hx    Esophageal cancer Neg Hx    Rectal cancer Neg Hx    Stomach cancer Neg Hx     Prior to Admission medications   Medication  Sig Start Date End Date Taking? Authorizing Provider  acetaminophen (TYLENOL) 500 MG tablet Take 1,000 mg by mouth every 6 (six) hours as needed for headache (pain).    [provider]  aspirin EC 81 MG tablet Take by mouth.    [provider]  azelastine (ASTELIN) 0.1 % nasal spray Place 2 sprays into both nostrils 2 (two) times daily. Use in each nostril as directed Patient taking differently: Place 2 sprays into both nostrils 2 (two) times daily as needed for rhinitis or allergies. Use in each nostril as directed 09/27/17   Porfirio Oar, PA  blood glucose meter kit and supplies KIT Dispense based on patient and insurance preference. Use up  to four times daily as directed. (FOR ICD-9 250.00, 250.01). 07/02/19   Dorcas Carrow, MD  Blood Glucose Monitoring Suppl DEVI 1 each by Does not apply route 2 (two) times daily for 1 day. 07/02/19 07/03/19  Dorcas Carrow, MD  cefdinir (OMNICEF) 300 MG capsule Take 1 capsule (300 mg total) by mouth 2 (two) times daily. 11/12/23   Mardella Layman, MD  cyclobenzaprine (FLEXERIL) 10 MG tablet Take 1 tablet (10 mg total) by mouth 3 (three) times daily as needed for muscle spasms. Patient taking differently: Take 10 mg by mouth at bedtime as needed for muscle spasms (back pain).  09/27/17   Porfirio Oar, PA  famotidine (PEPCID) 20 MG tablet Take 1 tablet (20 mg total) by mouth as needed for heartburn or indigestion. 01/04/20   Armbruster, Willaim Rayas, MD  glucose blood (ACCU-CHEK GUIDE) test strip Use to check blood sugar 3 times a day 07/31/19   Carlus Pavlov, MD  guaiFENesin (MUCINEX) 600 MG 12 hr tablet Take 600 mg by mouth 2 (two) times daily as needed for cough or to loosen phlegm.     [provider]  Insulin Pen Needle 32G X 4 MM MISC Use 1x a day 07/30/19   Carlus Pavlov, MD  JARDIANCE 10 MG TABS tablet Take 10 mg by mouth daily.    [provider]  metFORMIN (GLUCOPHAGE) 500 MG tablet TAKE 1 TABLET (500 MG TOTAL) BY MOUTH 2 (TWO) TIMES DAILY WITH A MEAL. 08/05/21 09/04/21  Carlus Pavlov, MD  nadolol (CORGARD) 40 MG tablet TAKE 1 TABLET (40 MG TOTAL) BY MOUTH DAILY Patient taking differently: Take 40 mg by mouth daily.  09/27/17   Porfirio Oar, PA  RESTASIS 0.05 % ophthalmic emulsion 1 drop 2 (two) times daily.    [provider]  rosuvastatin (CRESTOR) 10 MG tablet Take 10 mg by mouth at bedtime.    [provider]  triamterene-hydrochlorothiazide (MAXZIDE-25) 37.5-25 MG tablet Take 1 tablet by mouth daily. 09/27/17   Porfirio Oar, PA    Physical Exam: Vitals:   11/23/23 1200 11/23/23 1245 11/23/23 1300 11/23/23 1403  BP: 103/65 112/73 106/71 106/71   Pulse: 65 65 64 68  Resp:    16  Temp:    97.6 F (36.4 C)  TempSrc:    Oral  SpO2: 97% 98% 96% 97%  Weight:      Height:        Constitutional: NAD, calm, comfortable, on room air, communicating well, spouse at the bedside, appears dehydrated Eyes: PERRL, lids and conjunctivae normal ENMT: Dry mucous membrane. Posterior pharynx clear of any exudate or lesions.Normal dentition.  Neck: normal, supple, no masses, no thyromegaly Respiratory: clear to auscultation bilaterally, no wheezing, no crackles. Normal respiratory effort. No accessory muscle use.  Cardiovascular: Regular rate and rhythm,  no murmurs / rubs / gallops. No extremity edema. 2+ pedal pulses. No carotid bruits.  Abdomen: no tenderness, no masses palpated. No hepatosplenomegaly. Bowel sounds positive.  Musculoskeletal: no clubbing / cyanosis. No joint deformity upper and lower extremities. Good ROM, no contractures. Normal muscle tone.  Skin: no rashes, lesions, ulcers. No induration Neurologic: CN 2-12 grossly intact. Sensation intact, DTR normal. Strength 5/5 in all 4.  Psychiatric: Normal judgment and insight. Alert and oriented x 3. Normal mood.    Labs on Admission: I have personally reviewed following labs and imaging studies  CBC: Recent Labs  Lab 11/23/23 1208  WBC 8.8  NEUTROABS 6.5  HGB 16.9*  HCT 49.7*  MCV 84.2  PLT 226   Basic Metabolic Panel: Recent Labs  Lab 11/23/23 1208  NA 130*  K 3.7  CL 91*  CO2 22  GLUCOSE 471*  BUN 52*  CREATININE 2.99*  CALCIUM 10.2   GFR: Estimated Creatinine Clearance: 20.2 mL/min (A) (by C-G formula based on SCr of 2.99 mg/dL (H)). Liver Function Tests: Recent Labs  Lab 11/23/23 1208  AST 61*  ALT 32  ALKPHOS 134*  BILITOT 1.9*  PROT 8.7*  ALBUMIN 3.7   No results for input(s): "LIPASE", "AMYLASE" in the last 168 hours. No results for input(s): "AMMONIA" in the last 168 hours. Coagulation Profile: No results for input(s): "INR", "PROTIME" in the  last 168 hours. Cardiac Enzymes: No results for input(s): "CKTOTAL", "CKMB", "CKMBINDEX", "TROPONINI" in the last 168 hours. BNP (last 3 results) No results for input(s): "PROBNP" in the last 8760 hours. HbA1C: No results for input(s): "HGBA1C" in the last 72 hours. CBG: Recent Labs  Lab 11/23/23 1036 11/23/23 1353  GLUCAP 444* 410*   Lipid Profile: No results for input(s): "CHOL", "HDL", "LDLCALC", "TRIG", "CHOLHDL", "LDLDIRECT" in the last 72 hours. Thyroid Function Tests: No results for input(s): "TSH", "T4TOTAL", "FREET4", "T3FREE", "THYROIDAB" in the last 72 hours. Anemia Panel: No results for input(s): "VITAMINB12", "FOLATE", "FERRITIN", "TIBC", "IRON", "RETICCTPCT" in the last 72 hours. Urine analysis:    Component Value Date/Time   COLORURINE YELLOW 11/23/2023 1331   APPEARANCEUR CLEAR 11/23/2023 1331   LABSPEC 1.024 11/23/2023 1331   PHURINE 6.0 11/23/2023 1331   GLUCOSEU >=500 (A) 11/23/2023 1331   HGBUR NEGATIVE 11/23/2023 1331   BILIRUBINUR NEGATIVE 11/23/2023 1331   BILIRUBINUR negative 10/14/2023 1808   BILIRUBINUR neg 04/08/2015 1304   KETONESUR NEGATIVE 11/23/2023 1331   PROTEINUR NEGATIVE 11/23/2023 1331   UROBILINOGEN 0.2 10/14/2023 1808   UROBILINOGEN 0.2 06/11/2015 1802   NITRITE NEGATIVE 11/23/2023 1331   LEUKOCYTESUR NEGATIVE 11/23/2023 1331    Radiological Exams on Admission: No results found.   Assessment/Plan  Hyperglycemia: -Presented with hyperglycemia >400s.  Unsure about compliance? -Hold on metformin and Jardiance -Received IV fluid bolus x 2 in ED.  Will give another IV bolus -Received NovoLog 8 units in ED -Not in DKA or hyperosmolar diabetic hyperosmolar hyperglycemic state.  Normal pH and bicarb, no ketonuria.  Beta hydroxybutyric acid-slightly elevated -Start on sliding scale insulin.  Monitor blood sugar closely  AKI on CKD stage IIIb: Continue IV fluids.  Avoid nephrotoxic medication.  Repeat BMP tomorrow a.m. -Renal stone  study pending  Hypertension: Blood pressure on lower range.  Will hold on Maxide due to AKI. -Monitor closely.  Continue nadolol  Hyponatremia: -Likely pseudohyponatremia in the setting of hyperglycemia.  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  Will continue to monitor  DVT prophylaxis: Heparin Code Status: Full code Family Communication: Patient and spouse present at bedside.  Plan of care discussed with patient in length and he verbalized understanding and agreed with it. Disposition Plan: Home Consults called: None Admission status: Inpatient   Ollen Bowl MD [facility]  If 7PM-7AM, please contact night-coverage [email or web address]  11/23/2023, 2:57 PM

## 2023-11-23 NOTE — ED Notes (Signed)
ED TO INPATIENT HANDOFF REPORT  ED Nurse Name and Phone #: Darral Dash RN [phone] S Name/Age/Gender Melissa [name] 62 y.o. female Room/Bed: 024C/024C  Code Status   Code Status: Full Code  Home/SNF/Other Home Patient oriented to: self, place, time, and situation Is this baseline? Yes   Triage Complete: Triage complete  Chief Complaint AKI (acute kidney injury) (HCC) [N17.9]  Triage Note Cam POV from home, for possible hyperglycemia, patients spouse checked patients blood sugar and it was over 400. Patient was having weakness, patient states she has been having polyuria and polydipsia.    Allergies Allergies  Allergen Reactions   Codeine Nausea And Vomiting   Erythromycin Nausea And Vomiting   Ketek [Telithromycin] Nausea And Vomiting   Oxycodone-Acetaminophen Nausea And Vomiting   Penicillins Nausea And Vomiting    Did it involve swelling of the face/tongue/throat, SOB, or low BP? No Did it involve sudden or severe rash/hives, skin peeling, or any reaction on the inside of your mouth or nose? No Did you need to seek medical attention at a hospital or doctor's office? No When did it last happen?  teenager     If all above answers are "NO", may proceed with cephalosporin use.   Propoxyphene N-Acetaminophen Nausea And Vomiting   Sulfonamide Derivatives Nausea And Vomiting   Vicodin [Hydrocodone-Acetaminophen] Nausea And Vomiting    Level of Care/Admitting Diagnosis ED Disposition     ED Disposition  Admit   Condition  --   Comment  Hospital Area: [facility] [[id number]]  Level of Care: Med-Surg [16]  May admit patient to [facility] or [facility] if equivalent level of care is available:: Yes  Covid Evaluation: Asymptomatic - no recent exposure (last 10 days) testing not required  Diagnosis: AKI (acute kidney injury) [facility]) [[id number]]  Admitting Physician: Ollen Bowl [[id number]]  Attending Physician: Ollen Bowl [phone]  Certification:: I  certify this patient will need inpatient services for at least 2 midnights  Expected Medical Readiness: 11/25/2023          B Medical/Surgery History 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   Diabetes mellitus without complication (HCC) dx'd 06/11/2015   [name] 06/11/2015   Esophageal varices (HCC)    GERD (gastroesophageal reflux disease)    Heart murmur    Hypertension    Sarcoidosis    Past Surgical History:  Procedure Laterality Date   ABDOMINAL HYSTERECTOMY  1997   "partial"   CARPAL TUNNEL RELEASE Right 1987   CESAREAN SECTION  1979   FOOT SURGERY Bilateral 2018   REDUCTION MAMMAPLASTY Bilateral 2005   TUBAL LIGATION  1981     A IV Location/Drains/Wounds Patient Lines/Drains/Airways Status     Active Line/Drains/Airways     Name Placement date Placement time Site Days   Peripheral IV 11/23/23 22 G Right Antecubital 11/23/23  --  Antecubital  less than 1            Intake/Output Last 24 hours No intake or output data in the 24 hours ending 11/23/23 1510  Labs/Imaging Results for orders placed or performed during the hospital encounter of 11/23/23 (from the past 48 hours)  CBG monitoring, ED     Status: Abnormal   Collection Time: 11/23/23 10:36 AM  Result  Value Ref Range   Glucose-Capillary 444 (H) 70 - 99 mg/dL    Comment: Glucose reference range applies only to samples taken after fasting for at least 8 hours.  CBC with Differential     Status: Abnormal   Collection Time: 11/23/23 12:08 PM  Result Value Ref Range   WBC 8.8 4.0 - 10.5 K/uL   RBC 5.90 (H) 3.87 - 5.11 MIL/uL   Hemoglobin 16.9 (H) 12.0 - 15.0 g/dL   HCT 16.1 (H) 09.6 - 04.5 %   MCV 84.2 80.0 - 100.0 fL   MCH 28.6 26.0 - 34.0 pg   MCHC 34.0 30.0 - 36.0 g/dL   RDW 40.9 81.1 - 91.4 %    Platelets 226 150 - 400 K/uL   nRBC 0.0 0.0 - 0.2 %   Neutrophils Relative % 73 %   Neutro Abs 6.5 1.7 - 7.7 K/uL   Lymphocytes Relative 15 %   Lymphs Abs 1.3 0.7 - 4.0 K/uL   Monocytes Relative 8 %   Monocytes Absolute 0.7 0.1 - 1.0 K/uL   Eosinophils Relative 3 %   Eosinophils Absolute 0.2 0.0 - 0.5 K/uL   Basophils Relative 1 %   Basophils Absolute 0.1 0.0 - 0.1 K/uL   Immature Granulocytes 0 %   Abs Immature Granulocytes 0.02 0.00 - 0.07 K/uL    Comment: Performed at [facility] Lab, [address], [place], [place] [address]  Comprehensive metabolic panel     Status: Abnormal   Collection Time: 11/23/23 12:08 PM  Result Value Ref Range   Sodium 130 (L) 135 - 145 mmol/L   Potassium 3.7 3.5 - 5.1 mmol/L    Comment: HEMOLYSIS AT THIS LEVEL MAY AFFECT RESULT   Chloride 91 (L) 98 - 111 mmol/L   CO2 22 22 - 32 mmol/L   Glucose, Bld 471 (H) 70 - 99 mg/dL    Comment: Glucose reference range applies only to samples taken after fasting for at least 8 hours.   BUN 52 (H) 8 - 23 mg/dL   Creatinine, Ser 6.21 (H) 0.44 - 1.00 mg/dL   Calcium 30.8 8.9 - 65.7 mg/dL   Total Protein 8.7 (H) 6.5 - 8.1 g/dL   Albumin 3.7 3.5 - 5.0 g/dL   AST 61 (H) 15 - 41 U/L    Comment: HEMOLYSIS AT THIS LEVEL MAY AFFECT RESULT   ALT 32 0 - 44 U/L    Comment: HEMOLYSIS AT THIS LEVEL MAY AFFECT RESULT   Alkaline Phosphatase 134 (H) 38 - 126 U/L   Total Bilirubin 1.9 (H) <1.2 mg/dL    Comment: HEMOLYSIS AT THIS LEVEL MAY AFFECT RESULT   GFR, Estimated 17 (L) >60 mL/min    Comment: (NOTE) Calculated using the CKD-EPI Creatinine Equation (2021)    Anion gap 17 (H) 5 - 15    Comment: Performed at [facility], [address], [place], [place] [address]  Urinalysis, Routine w reflex microscopic -Urine, Clean Catch     Status: Abnormal   Collection Time: 11/23/23  1:31 PM  Result Value Ref Range   Color, Urine YELLOW YELLOW   APPearance CLEAR CLEAR   Specific Gravity, Urine 1.024 1.005 - 1.030    pH 6.0 5.0 - 8.0   Glucose, UA >=500 (A) NEGATIVE mg/dL   Hgb urine dipstick NEGATIVE NEGATIVE   Bilirubin Urine NEGATIVE NEGATIVE   Ketones, ur NEGATIVE NEGATIVE mg/dL   Protein, ur NEGATIVE NEGATIVE mg/dL   Nitrite NEGATIVE NEGATIVE  Leukocytes,Ua NEGATIVE NEGATIVE   RBC / HPF 0-5 0 - 5 RBC/hpf   WBC, UA 0-5 0 - 5 WBC/hpf   Bacteria, UA RARE (A) NONE SEEN   Squamous Epithelial / HPF 0-5 0 - 5 /HPF   Mucus PRESENT     Comment: Performed at [facility], [address], [place], [place] [address]  POC CBG, ED     Status: Abnormal   Collection Time: 11/23/23  1:53 PM  Result Value Ref Range   Glucose-Capillary 410 (H) 70 - 99 mg/dL    Comment: Glucose reference range applies only to samples taken after fasting for at least 8 hours.   No results found.  Pending Labs Unresulted Labs (From admission, onward)     Start     Ordered   11/24/23 0500  Comprehensive metabolic panel  Tomorrow morning,   R        11/23/23 1456   11/24/23 0500  CBC  Tomorrow morning,   R        11/23/23 1456   11/23/23 1455  HIV Antibody (routine testing w rflx)  (HIV Antibody (Routine testing w reflex) panel)  Once,   R        11/23/23 1456   11/23/23 1455  Hemoglobin A1c  (Glycemic Control (SSI)  Q 4 Hours / Glycemic Control (SSI)  AC +/- HS)  Once,   R       Comments: To assess prior glycemic control    11/23/23 1456   11/23/23 1455  CBC  (heparin)  Once,   R       Comments: Baseline for heparin therapy IF NOT ALREADY DRAWN.  Notify MD if PLT < 100 K.    11/23/23 1456   11/23/23 1455  Creatinine, serum  (heparin)  Once,   R       Comments: Baseline for heparin therapy IF NOT ALREADY DRAWN.    11/23/23 1456            Vitals/Pain Today's Vitals   11/23/23 1245 11/23/23 1300 11/23/23 1403 11/23/23 1500  BP: 112/73 106/71 106/71 111/77  Pulse: 65 64 68 68  Resp:   16   Temp:   97.6 F (36.4 C)   TempSrc:   Oral   SpO2: 98% 96% 97% 98%  Weight:      Height:      PainSc:         Isolation Precautions No active isolations  Medications Medications  0.9 %  sodium chloride infusion (has no administration in time range)  heparin injection 5,000 Units (has no administration in time range)  ondansetron (ZOFRAN) tablet 4 mg (has no administration in time range)    Or  ondansetron (ZOFRAN) injection 4 mg (has no administration in time range)  nadolol (CORGARD) tablet 40 mg (has no administration in time range)  rosuvastatin (CRESTOR) tablet 10 mg (has no administration in time range)  famotidine (PEPCID) tablet 20 mg (has no administration in time range)  lactated ringers bolus 2,000 mL (0 mLs Intravenous Stopped 11/23/23 1416)  insulin aspart (novoLOG) injection 8 Units (8 Units Subcutaneous Given 11/23/23 1417)    Mobility walks     Focused Assessments Hyperglycemia    R Recommendations: See Admitting Provider Note  Report given to:   Additional Notes: Patient stated she has a history of this happening to her back in 2016, she was seen by endocrinologist but has not been following up with them due to not being  diagnosed with diabetes according to the patient. Patient is A/Ox4 and ambulatory

## 2023-11-23 NOTE — ED Provider Notes (Signed)
[place] EMERGENCY DEPARTMENT AT [facility] Provider Note   CSN: [id number] Arrival date & time: 11/23/23  1021     History  Chief Complaint  Patient presents with   Hyperglycemia    Melissa [name] is a 62 y.o. female.  62 year old female presents today for concern of hyperglycemia.  She states she was previously a diabetic and was on insulin but this was back in 2016.  Since then she has not been on insulin but does take Jardiance.  She states recently she was diagnosed with an ear infection about 10 days ago and has taken antibiotics.  She states she was not feeling well and had previous glucose monitor laying around so they checked her blood sugar which came elevated at 443.  Husband is at bedside.  No other concerning symptoms.  No nausea or vomiting or confusion.  The history is provided by the patient. No language interpreter was used.       Home Medications Prior to Admission medications   Medication Sig Start Date End Date Taking? Authorizing Provider  acetaminophen (TYLENOL) 500 MG tablet Take 1,000 mg by mouth every 6 (six) hours as needed for headache (pain).    [provider]  aspirin EC 81 MG tablet Take by mouth.    [provider]  azelastine (ASTELIN) 0.1 % nasal spray Place 2 sprays into both nostrils 2 (two) times daily. Use in each nostril as directed Patient taking differently: Place 2 sprays into both nostrils 2 (two) times daily as needed for rhinitis or allergies. Use in each nostril as directed 09/27/17   Porfirio Oar, PA  blood glucose meter kit and supplies KIT Dispense based on patient and insurance preference. Use up to four times daily as directed. (FOR ICD-9 250.00, 250.01). 07/02/19   Dorcas Carrow, MD  Blood Glucose Monitoring Suppl DEVI 1 each by Does not apply route 2 (two) times daily for 1 day. 07/02/19 07/03/19  Dorcas Carrow, MD  cefdinir (OMNICEF) 300 MG capsule Take 1 capsule (300 mg total) by mouth 2 (two)  times daily. 11/12/23   Mardella Layman, MD  cyclobenzaprine (FLEXERIL) 10 MG tablet Take 1 tablet (10 mg total) by mouth 3 (three) times daily as needed for muscle spasms. Patient taking differently: Take 10 mg by mouth at bedtime as needed for muscle spasms (back pain).  09/27/17   Porfirio Oar, PA  famotidine (PEPCID) 20 MG tablet Take 1 tablet (20 mg total) by mouth as needed for heartburn or indigestion. 01/04/20   Armbruster, Willaim Rayas, MD  glucose blood (ACCU-CHEK GUIDE) test strip Use to check blood sugar 3 times a day 07/31/19   Carlus Pavlov, MD  guaiFENesin (MUCINEX) 600 MG 12 hr tablet Take 600 mg by mouth 2 (two) times daily as needed for cough or to loosen phlegm.     [provider]  Insulin Pen Needle 32G X 4 MM MISC Use 1x a day 07/30/19   Carlus Pavlov, MD  JARDIANCE 10 MG TABS tablet Take 10 mg by mouth daily.    [provider]  metFORMIN (GLUCOPHAGE) 500 MG tablet TAKE 1 TABLET (500 MG TOTAL) BY MOUTH 2 (TWO) TIMES DAILY WITH A MEAL. 08/05/21 09/04/21  Carlus Pavlov, MD  nadolol (CORGARD) 40 MG tablet TAKE 1 TABLET (40 MG TOTAL) BY MOUTH DAILY Patient taking differently: Take 40 mg by mouth daily.  09/27/17   Porfirio Oar, PA  RESTASIS 0.05 % ophthalmic emulsion 1 drop 2 (two) times daily.  [provider]  rosuvastatin (CRESTOR) 10 MG tablet Take 10 mg by mouth at bedtime.    [provider]  triamterene-hydrochlorothiazide (MAXZIDE-25) 37.5-25 MG tablet Take 1 tablet by mouth daily. 09/27/17   Porfirio Oar, PA      Allergies    Codeine, Erythromycin, Ketek [telithromycin], Oxycodone-acetaminophen, Penicillins, Propoxyphene n-acetaminophen, Sulfonamide derivatives, and Vicodin [hydrocodone-acetaminophen]    Review of Systems   Review of Systems  Constitutional:  Negative for chills and fever.  Respiratory:  Negative for shortness of breath.   Gastrointestinal:  Negative for abdominal pain.  Genitourinary:  Negative for  dysuria.  Neurological:  Negative for light-headedness.  All other systems reviewed and are negative.   Physical Exam Updated Vital Signs BP 112/73   Pulse 65   Temp 97.9 F (36.6 C) (Oral)   Resp 18   Ht 5' 2.5" (1.588 m)   Wt 91.6 kg   SpO2 98%   BMI 36.36 kg/m  Physical Exam Vitals and nursing note reviewed.  Constitutional:      General: She is not in acute distress.    Appearance: Normal appearance. She is not ill-appearing.  HENT:     Head: Normocephalic and atraumatic.     Nose: Nose normal.  Eyes:     General: No scleral icterus.    Extraocular Movements: Extraocular movements intact.     Conjunctiva/sclera: Conjunctivae normal.  Cardiovascular:     Rate and Rhythm: Normal rate and regular rhythm.     Heart sounds: Normal heart sounds.  Pulmonary:     Effort: Pulmonary effort is normal. No respiratory distress.     Breath sounds: Normal breath sounds. No wheezing or rales.  Abdominal:     General: There is no distension.     Tenderness: There is no abdominal tenderness.  Musculoskeletal:        General: Normal range of motion.     Cervical back: Normal range of motion.  Skin:    General: Skin is warm and dry.  Neurological:     General: No focal deficit present.     Mental Status: She is alert. Mental status is at baseline.     ED Results / Procedures / Treatments   Labs (all labs ordered are listed, but only abnormal results are displayed) Labs Reviewed  CBC WITH DIFFERENTIAL/PLATELET - Abnormal; Notable for the following components:      Result Value   RBC 5.90 (*)    Hemoglobin 16.9 (*)    HCT 49.7 (*)    All other components within normal limits  CBG MONITORING, ED - Abnormal; Notable for the following components:   Glucose-Capillary 444 (*)    All other components within normal limits  COMPREHENSIVE METABOLIC PANEL    EKG None  Radiology No results found.  Procedures Procedures    Medications Ordered in ED Medications  lactated  ringers bolus 2,000 mL (2,000 mLs Intravenous New Bag/Given 11/23/23 1211)    ED Course/ Medical Decision Making/ A&P                                 Medical Decision Making Amount and/or Complexity of Data Reviewed Labs: ordered. Radiology: ordered.  Risk Prescription drug management.   Medical Decision Making / ED Course   This patient presents to the ED for concern of hypoglycemia, this involves an extensive number of treatment options, and is a complaint that carries with it a high  risk of complications and morbidity.  The differential diagnosis includes HHS, DKA, dehydration, hyperglycemia  MDM: 62 year old female presents today for elevated blood sugar of 444.  No other complaints.  Currently not on insulin but does take Jardiance.  Will obtain basic labs.  Will provide 2 L fluid bolus and recheck CBG..  Goal is to bring her down into the 200 range.  If elevated after fluid bolus will provide her with insulin.   Blood glucose remains elevated at 410 on the repeat after fluids.  CBC is without leukocytosis or anemia.  Does show hemoglobin of 16.9 which could point towards hemoconcentration.  CMP shows sodium of 130 which could be pseudohyponatremia due to glucose of 471 on metabolic panel.  Creatinine of 2.99 which is above her baseline of around 1.3.  BUN is elevated indicating dehydration.  On further questioning she does admit to decreasing her water intake while she has been sick.  She drinks about 2-316 fluid ounce bottles per day while she has been sick but also endorses similar or slightly more intake of soft drinks.  Will add on CT renal to exclude concerning cause of AKI but this is likely due to dehydration.  Will start maintenance fluids as well.  Will discuss with hospitalist. Discussed with hospitalist.  They will evaluate patient for admission.     Lab Tests: -I ordered, reviewed, and interpreted labs.   The pertinent results include:   Labs Reviewed  CBC WITH  DIFFERENTIAL/PLATELET - Abnormal; Notable for the following components:      Result Value   RBC 5.90 (*)    Hemoglobin 16.9 (*)    HCT 49.7 (*)    All other components within normal limits  COMPREHENSIVE METABOLIC PANEL - Abnormal; Notable for the following components:   Sodium 130 (*)    Chloride 91 (*)    Glucose, Bld 471 (*)    BUN 52 (*)    Creatinine, Ser 2.99 (*)    Total Protein 8.7 (*)    AST 61 (*)    Alkaline Phosphatase 134 (*)    Total Bilirubin 1.9 (*)    GFR, Estimated 17 (*)    Anion gap 17 (*)    All other components within normal limits  CBG MONITORING, ED - Abnormal; Notable for the following components:   Glucose-Capillary 444 (*)    All other components within normal limits  URINALYSIS, ROUTINE W REFLEX MICROSCOPIC      EKG  EKG Interpretation Date/Time:    Ventricular Rate:    PR Interval:    QRS Duration:    QT Interval:    QTC Calculation:   R Axis:      Text Interpretation:           Imaging Studies ordered: I ordered imaging studies including CT renal stone study but not resulted at the time of admission. I independently visualized and interpreted imaging. I agree with the radiologist interpretation   Medicines ordered and prescription drug management: Meds ordered this encounter  Medications   lactated ringers bolus 2,000 mL    -I have reviewed the patients home medicines and have made adjustments as needed   Reevaluation: After the interventions noted above, I reevaluated the patient and found that they have :stayed the same  Co morbidities that complicate the patient evaluation  Past Medical History:  Diagnosis Date   Acute renal failure (HCC) 06/11/2015   Allergy    sinus allergies   Chronic kidney disease (CKD), stage III (  moderate) (HCC)    [name] 06/11/2015   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  They will evaluate for admission.   Final Clinical Impression(s) / ED Diagnoses Final diagnoses:  AKI (acute kidney injury) (HCC)  Hyperglycemia  Dehydration    Rx / DC Orders ED Discharge Orders     None         Marita Kansas, PA-C 11/23/23 1452    Gloris Manchester, MD 11/23/23 1627

## 2023-11-23 NOTE — ED Triage Notes (Signed)
Cam POV from home, for possible hyperglycemia, patients spouse checked patients blood sugar and it was over 400. Patient was having weakness, patient states she has been having polyuria and polydipsia.

## 2023-11-23 NOTE — Progress Notes (Signed)
Venous blood gas <31 as per call from lab.  Informed Dr. Estill Cotta via secure chat.

## 2023-11-24 DIAGNOSIS — N179 Acute kidney failure, unspecified: Secondary | ICD-10-CM | POA: Diagnosis not present

## 2023-11-24 LAB — COMPREHENSIVE METABOLIC PANEL
ALT: 30 U/L (ref 0–44)
AST: 59 U/L — ABNORMAL HIGH (ref 15–41)
Albumin: 3 g/dL — ABNORMAL LOW (ref 3.5–5.0)
Alkaline Phosphatase: 105 U/L (ref 38–126)
Anion gap: 13 (ref 5–15)
BUN: 40 mg/dL — ABNORMAL HIGH (ref 8–23)
CO2: 22 mmol/L (ref 22–32)
Calcium: 9.1 mg/dL (ref 8.9–10.3)
Chloride: 99 mmol/L (ref 98–111)
Creatinine, Ser: 2.13 mg/dL — ABNORMAL HIGH (ref 0.44–1.00)
GFR, Estimated: 25 mL/min — ABNORMAL LOW (ref >60)
Glucose, Bld: 146 mg/dL — ABNORMAL HIGH (ref 70–99)
Potassium: 2.3 mmol/L — CL (ref 3.5–5.1)
Sodium: 134 mmol/L — ABNORMAL LOW (ref 135–145)
Total Bilirubin: 1.2 mg/dL — ABNORMAL HIGH (ref <1.2)
Total Protein: 6.9 g/dL (ref 6.5–8.1)

## 2023-11-24 LAB — MAGNESIUM: Magnesium: 2 mg/dL (ref 1.7–2.4)

## 2023-11-24 LAB — CBC
HCT: 42 % (ref 36.0–46.0)
Hemoglobin: 14.3 g/dL (ref 12.0–15.0)
MCH: 28.8 pg (ref 26.0–34.0)
MCHC: 34 g/dL (ref 30.0–36.0)
MCV: 84.5 fL (ref 80.0–100.0)
Platelets: 178 10*3/uL (ref 150–400)
RBC: 4.97 MIL/uL (ref 3.87–5.11)
RDW: 13.6 % (ref 11.5–15.5)
WBC: 6 10*3/uL (ref 4.0–10.5)
nRBC: 0 % (ref 0.0–0.2)

## 2023-11-24 LAB — GLUCOSE, CAPILLARY
Glucose-Capillary: 244 mg/dL — ABNORMAL HIGH (ref 70–99)
Glucose-Capillary: 261 mg/dL — ABNORMAL HIGH (ref 70–99)
Glucose-Capillary: 301 mg/dL — ABNORMAL HIGH (ref 70–99)
Glucose-Capillary: 323 mg/dL — ABNORMAL HIGH (ref 70–99)

## 2023-11-24 LAB — HEMOGLOBIN A1C
Hgb A1c MFr Bld: 13 % — ABNORMAL HIGH (ref 4.8–5.6)
Mean Plasma Glucose: 326 mg/dL

## 2023-11-24 MED ORDER — POTASSIUM CHLORIDE IN NACL 40-0.9 MEQ/L-% IV SOLN
INTRAVENOUS | Status: AC
Start: 2023-11-24 — End: 2023-11-25
  Filled 2023-11-24 (×3): qty 1000

## 2023-11-24 MED ORDER — POTASSIUM CHLORIDE CRYS ER 20 MEQ PO TBCR
40.0000 meq | EXTENDED_RELEASE_TABLET | Freq: Once | ORAL | Status: AC
  Administered 2023-11-24: 40 meq via ORAL
  Filled 2023-11-24: qty 2

## 2023-11-24 MED ORDER — POTASSIUM CHLORIDE 20 MEQ PO PACK: 40.0000 meq | PACK | Freq: Once | ORAL | Status: DC

## 2023-11-24 MED ORDER — INSULIN GLARGINE-YFGN 100 UNIT/ML ~~LOC~~ SOLN
13.0000 [IU] | Freq: Every day | SUBCUTANEOUS | Status: DC
  Administered 2023-11-24 – 2023-11-27 (×4): 13 [IU] via SUBCUTANEOUS
  Filled 2023-11-24 (×4): qty 0.13

## 2023-11-24 MED ORDER — INSULIN STARTER KIT- PEN NEEDLES (ENGLISH)
1.0000 | Freq: Once | Status: AC
  Administered 2023-11-24: 1
  Filled 2023-11-24: qty 1

## 2023-11-24 MED ORDER — POTASSIUM CHLORIDE 20 MEQ PO PACK
40.0000 meq | PACK | Freq: Once | ORAL | Status: AC
  Administered 2023-11-24: 40 meq via ORAL
  Filled 2023-11-24: qty 2

## 2023-11-24 MED ORDER — POTASSIUM CHLORIDE CRYS ER 20 MEQ PO TBCR: 40.0000 meq | EXTENDED_RELEASE_TABLET | Freq: Once | ORAL | Status: DC

## 2023-11-24 MED ORDER — LIVING WELL WITH DIABETES BOOK
Freq: Once | Status: AC
  Filled 2023-11-24: qty 1

## 2023-11-24 MED ORDER — NYSTATIN 100000 UNIT/ML MT SUSP
5.0000 mL | Freq: Four times a day (QID) | OROMUCOSAL | Status: DC
  Administered 2023-11-24 – 2023-11-27 (×12): 500000 [IU] via ORAL
  Filled 2023-11-24 (×14): qty 5

## 2023-11-24 NOTE — Progress Notes (Signed)
  Progress Note   Patient: Melissa [name] [id number]:[id number] DOB: 1960/12/26 DOA: 11/23/2023     1 DOS: the patient was seen and examined on 11/24/2023   Assessment and Plan:  Hyperglycemia  - Novolog SS  - Semglee 13 unit sq daily  - Diabetes educator has seen the pt on 11/24/2023  AKI on CKD3b  - Cr is downtrending (2.60 -->2.13)  - IV NS w/ 40 K+ @ 100 cc/hr   HTN  - Monitor   HypoNa+  - Na+ uptrending 130 -->134   Liver cirrhosis  - Nadolol 40 mg PO daily   6. HypoK+ (2.3 on 11/24/2023) - IV NS w/ 40 K+ @ 100 cc/hr  - 40 meq K+ PO x 2   7. Thrush  - Nystatin 5mL 4 times daily       Subjective: Pt seen and examined at the bedside. Cr is downtrending towards the pt's baseline. K+ was 2.3 today and IV fluids were changed to IV NS 40K+. Recheck K+ in the morning along with the pt's Cr.   Physical Exam: Vitals:   11/24/23 0029 11/24/23 0030 11/24/23 0430 11/24/23 0820  BP: (!) 92/56 96/68 114/67 107/65  Pulse: 62  69 71  Resp: 18  18 16   Temp: 98 F (36.7 C)  97.6 F (36.4 C) 98.3 F (36.8 C)  TempSrc: Oral  Oral Oral  SpO2: 100%  100% 100%  Weight:      Height:       Physical Exam Constitutional:      Appearance: Normal appearance.  HENT:     Head: Normocephalic.     Mouth/Throat:     Mouth: Mucous membranes are moist.  Cardiovascular:     Rate and Rhythm: Normal rate and regular rhythm.  Pulmonary:     Effort: Pulmonary effort is normal.  Abdominal:     Palpations: Abdomen is soft.  Musculoskeletal:        General: Normal range of motion.     Cervical back: Neck supple.  Skin:    General: Skin is warm.  Neurological:     Mental Status: She is alert. Mental status is at baseline.  Psychiatric:        Mood and Affect: Mood normal.      Disposition: Status is: Inpatient Remains inpatient appropriate because: IV fluids and serial labs   Planned Discharge Destination: Home    Time spent: 35 minutes  Author: Baron Hamper ,  MD 11/24/2023 2:02 PM  For on call review [email or web address].

## 2023-11-24 NOTE — TOC CM/SW Note (Signed)
Transition of Care [facility]) - Inpatient Brief Assessment   Patient Details  Name: Daania Doehring MRN: [id number] Date of Birth: 07-23-1961  Transition of Care [facility]) CM/SW Contact:    Harriet Masson, RN Phone Number: 11/24/2023, 2:04 PM   Clinical Narrative: Patient admitted for Elevated blood sugar. No TOC needs at this time.   Transition of Care Asessment: Insurance and Status: Insurance coverage has been reviewed Patient has primary care physician: Yes Home environment has been reviewed: safe to discharge home Prior level of function:: independent Prior/Current Home Services: No current home services Social Drivers of Health Review: SDOH reviewed no interventions necessary Readmission risk has been reviewed: Yes Transition of care needs: no transition of care needs at this time

## 2023-11-24 NOTE — Plan of Care (Signed)

## 2023-11-24 NOTE — Plan of Care (Signed)
  Problem: Education: Goal: Ability to describe self-care measures that may prevent or decrease complications (Diabetes Survival Skills Education) will improve Outcome: Not Progressing Goal: Individualized Educational Video(s) Outcome: Not Progressing   

## 2023-11-24 NOTE — Inpatient Diabetes Management (Addendum)
Inpatient Diabetes Program Recommendations  AACE/ADA: New Consensus Statement on Inpatient Glycemic Control (2015)  Target Ranges:  Prepandial:   less than 140 mg/dL      Peak postprandial:   less than 180 mg/dL (1-2 hours)      Critically ill patients:  140 - 180 mg/dL   Lab Results  Component Value Date   GLUCAP 253 (H) 11/23/2023   HGBA1C 5.7 (A) 07/25/2020    Latest Reference Range & Units 11/23/23 10:36 11/23/23 13:53 11/23/23 16:36 11/23/23 21:04  Glucose-Capillary 70 - 99 mg/dL 562 (H) 130 (H) Novolog 8 units 380 (H) Novolog 15 units 253 (H) Novolog 3 units  (H): Data is abnormally high  Review of Glycemic Control  Diabetes history: DM2 Outpatient Diabetes medications: Jardiance 10 mg daily, Metformin 500 mg bid Current orders for Inpatient glycemic control: Novolog 0-15 units tid, 0-5 units hs  Inpatient Diabetes Program Recommendations:   Please consider: -Add Semglee 12 units (0.15 units/kg x 91.6 kg = 14 units) -Decrease Novolog correction to 0-9 units tid, 0-5 units hs Ordered Living Well With Diabetes booklet and insulin pen starter kit to prepare if starts on insulin. RN checked CBG this am and fasting 244. Patient has seen Dr. Elvera Lennox in the past for endocrinology with last office visit listed as 07/25/20. Patient was on insulin in the past but stopped @ this visit. Spoke with patient @ bedside and patient has been checking CBGs regularly and followup with her physicians. Explained to patient her CBGs are elevated and may need to be on insulin prior to discharge. Patient took insulin using insulin pens in the past and her husband normally gives her the injections. Patient states willingness to restart insulin as needed. RN to start allowing patient to give own injections to assist with D/C preparations. A1c 13.0 (average blood glucose 326 over the past 2-3 months).  Thank you, Billy Fischer. Tracina Beaumont, RN, MSN, CDCES  Diabetes Coordinator Inpatient Glycemic Control  Team Team Pager [phone] (8am-5pm) 11/24/2023 9:14 AM

## 2023-11-25 ENCOUNTER — Telehealth (HOSPITAL_COMMUNITY): Payer: Self-pay | Admitting: Pharmacy Technician

## 2023-11-25 ENCOUNTER — Other Ambulatory Visit (HOSPITAL_COMMUNITY): Payer: Self-pay

## 2023-11-25 DIAGNOSIS — N179 Acute kidney failure, unspecified: Secondary | ICD-10-CM | POA: Diagnosis not present

## 2023-11-25 LAB — GLUCOSE, CAPILLARY
Glucose-Capillary: 126 mg/dL — ABNORMAL HIGH (ref 70–99)
Glucose-Capillary: 187 mg/dL — ABNORMAL HIGH (ref 70–99)
Glucose-Capillary: 261 mg/dL — ABNORMAL HIGH (ref 70–99)
Glucose-Capillary: 246 mg/dL — ABNORMAL HIGH (ref 70–99)

## 2023-11-25 LAB — RENAL FUNCTION PANEL
Albumin: 2.7 g/dL — ABNORMAL LOW (ref 3.5–5.0)
Anion gap: 9 (ref 5–15)
BUN: 23 mg/dL (ref 8–23)
CO2: 19 mmol/L — ABNORMAL LOW (ref 22–32)
Calcium: 8.6 mg/dL — ABNORMAL LOW (ref 8.9–10.3)
Chloride: 109 mmol/L (ref 98–111)
Creatinine, Ser: 1.87 mg/dL — ABNORMAL HIGH (ref 0.44–1.00)
GFR, Estimated: 30 mL/min — ABNORMAL LOW (ref >60)
Glucose, Bld: 247 mg/dL — ABNORMAL HIGH (ref 70–99)
Phosphorus: 1.1 mg/dL — ABNORMAL LOW (ref 2.5–4.6)
Potassium: 3.8 mmol/L (ref 3.5–5.1)
Sodium: 137 mmol/L (ref 135–145)

## 2023-11-25 LAB — MAGNESIUM: Magnesium: 2 mg/dL (ref 1.7–2.4)

## 2023-11-25 MED ORDER — ACETAMINOPHEN 325 MG PO TABS
650.0000 mg | ORAL_TABLET | Freq: Once | ORAL | Status: AC
  Administered 2023-11-25: 650 mg via ORAL
  Filled 2023-11-25: qty 2

## 2023-11-25 MED ORDER — POTASSIUM PHOSPHATES 15 MMOLE/5ML IV SOLN: 30.0000 mmol | Freq: Once | INTRAVENOUS | Status: DC

## 2023-11-25 MED ORDER — SODIUM CHLORIDE 0.9 % IV SOLN
INTRAVENOUS | Status: DC
Start: 2023-11-25 — End: 2023-11-26

## 2023-11-25 MED ORDER — K PHOS MONO-SOD PHOS DI & MONO 155-852-130 MG PO TABS
500.0000 mg | ORAL_TABLET | Freq: Three times a day (TID) | ORAL | Status: AC
  Administered 2023-11-26 (×3): 500 mg via ORAL
  Filled 2023-11-25 (×3): qty 2

## 2023-11-25 MED ORDER — POTASSIUM PHOSPHATES 15 MMOLE/5ML IV SOLN
30.0000 mmol | Freq: Once | INTRAVENOUS | Status: AC
  Administered 2023-11-25: 30 mmol via INTRAVENOUS
  Filled 2023-11-25: qty 10

## 2023-11-25 NOTE — Telephone Encounter (Signed)
Pharmacy Patient Advocate Encounter   Received notification that prior authorization for FreeStyle Libre 3 Sensor is required/requested.   Insurance verification completed.   The patient is insured through [facility] .   Per test claim: PA required; PA submitted to above mentioned insurance via CoverMyMeds Key/confirmation #/EOC [id number] Status is pending

## 2023-11-25 NOTE — Plan of Care (Signed)

## 2023-11-25 NOTE — Telephone Encounter (Signed)
Patient Product/process development scientist completed.    The patient is insured through [facility]. Patient has [employer], may use a copay card, and/or apply for patient assistance if available.    Ran test claim for Lantus Pen and the current 30 day co-pay is $0.00.  Ran test claim for Humalog KwikPen and the current 30 day co-pay is $0.00.  Ran test claim for [employer] and Requires Prior Authorization  This test claim was processed through [employer]- copay amounts may vary at other pharmacies due to [employer], or as the patient moves through the different stages of their insurance plan.     Roland Earl, CPHT Pharmacy Technician III Certified Patient Advocate [facility] Pharmacy Patient Advocate Team Direct Number: [phone]  Fax: [phone]

## 2023-11-25 NOTE — Progress Notes (Signed)
Progress Note   Patient: Melissa [name] [id number]:[id number] DOB: 07/23/1961 DOA: 11/23/2023     2 DOS: the patient was seen and examined on 11/25/2023  Brief narrative: Patient is a 62 year old African-American female past medical history significant for type 2 diabetes mellitus, hypertension, hyperlipidemia, liver cirrhosis and chronic kidney disease.  Patient was admitted with significantly elevated blood sugar (greater than 400), acute kidney injury on chronic kidney disease stage IIIb, volume depletion, and electrolyte abnormalities.  11/25/2023: Acute kidney injury slowly improving.  Volume depletion is improving.  Will repeat renal panel stat.  Follow potassium and phosphorus level.  Follow sodium level.  Optimize hydration.  Replete abnormal electrolytes.  Likely discharge back home in the next 24 hours.  Blood sugars improving.   Assessment and Plan:  Hyperglycemia  - Novolog SS  - Semglee 13 unit sq daily  - Diabetes educator has seen the pt on 11/24/2023 11/25/2023: Improving.  Continue current regimen.  AKI on CKD3b  - Cr is downtrending (2.60 -->2.13)  - IV NS w/ 40 K+ @ 100 cc/hr  11/25/2023: Continue IV fluid.  HTN  - Monitor  11/25/2023: Controlled.  HypoNa+  - Na+ uptrending 130 -->134  11/25/2023: Resolved.  Likely volume related.  Sodium is 137 today.  Liver cirrhosis  - Nadolol 40 mg PO daily   6. HypoK+ (2.3 on 11/24/2023) - IV NS w/ 40 K+ @ 100 cc/hr  - 40 meq K+ PO x 2  11/25/2023: Repeat potassium is 3.8 today.  7. Thrush  - Nystatin 5mL 4 times daily   8.  Hypophosphatemia: -IV K-Phos. -Start Neutra-Phos in the morning.      Subjective: Pt seen and examined at the bedside. Cr is downtrending towards the pt's baseline. K+ was 2.3 today and IV fluids were changed to IV NS 40K+. Recheck K+ in the morning along with the pt's Cr.   Physical Exam: Vitals:   11/25/23 0350 11/25/23 0556 11/25/23 0753 11/25/23 1513  BP: 106/66  101/68 100/63   Pulse: 69  67 76  Resp: 17  18 18   Temp:  98.2 F (36.8 C) 97.6 F (36.4 C) 98 F (36.7 C)  TempSrc:  Oral Oral   SpO2: 95%  100% 100%  Weight:      Height:       Physical Exam Constitutional:      General: She is not in acute distress.    Appearance: She is not ill-appearing or diaphoretic.  HENT:     Head: Normocephalic and atraumatic.     Nose: Nose normal.     Mouth/Throat:     Mouth: Mucous membranes are moist.  Eyes:     Extraocular Movements: Extraocular movements intact.  Cardiovascular:     Rate and Rhythm: Normal rate and regular rhythm.     Pulses: Normal pulses.  Pulmonary:     Effort: Pulmonary effort is normal.  Abdominal:     Palpations: Abdomen is soft.  Musculoskeletal:        General: Normal range of motion.     Cervical back: Normal range of motion and neck supple.  Skin:    General: Skin is warm.  Neurological:     Mental Status: She is alert. Mental status is at baseline.  Psychiatric:        Mood and Affect: Mood normal.      Disposition: Status is: Inpatient Remains inpatient appropriate because: IV fluids and serial labs   Planned Discharge Destination: Home    Time  spent: 35 minutes  Author: Barnetta Chapel, MD 11/25/2023 8:01 PM  For on call review [email or web address].

## 2023-11-25 NOTE — Inpatient Diabetes Management (Addendum)
Inpatient Diabetes Program Recommendations  AACE/ADA: New Consensus Statement on Inpatient Glycemic Control (2015)  Target Ranges:  Prepandial:   less than 140 mg/dL      Peak postprandial:   less than 180 mg/dL (1-2 hours)      Critically ill patients:  140 - 180 mg/dL   Lab Results  Component Value Date   GLUCAP 126 (H) 11/25/2023   HGBA1C 13.0 (H) 11/23/2023    Latest Reference Range & Units 11/24/23 08:25 11/24/23 11:40 11/24/23 16:29 11/24/23 21:49 11/25/23 07:52  Glucose-Capillary 70 - 99 mg/dL 244 (H) Novolog 5 units 261 (H) Novolog 8 units 301 (H) Novolog 11 units 323 (H) Novolog 4 units 126 (H) Novolog 2 units  (H): Data is abnormally high  Review of Glycemic Control  Diabetes history: DM2 Outpatient Diabetes medications: Jardiance 10 mg daily, Metformin 500 mg bid Current orders for Inpatient glycemic control:  Semglee 13 units daily, Novolog 0-15 units tid, 0-5 units hs  Inpatient Diabetes Program Recommendations:   Please consider: -Add Novolog 4 units tid meal coverage if eats 50%  Referral back to Dr. Elvera Lennox @ discharge.  12:00 noon Met with patient and husband @ bedside.  MD ordered application of Freestyle CGM at discharge for patient. Education done regarding application and changing CGM sensor (alternate every 14 days on back of left arm), 1 hour warm-up, use of glucometer when alert displays, how to scan CGM for glucose reading and information for PCP. Patient has also been given educational packet regarding use CGM sensor including the 1-800 toll free number for any questions, problems or needs related to the [facility] sensors or reader.    Sensor applied by patient to (L) Arm at .  Explained that glucose readings will not be available until 1 hour after application. Reviewed use of CGM including how to scan, changing Sensor, Vitamin C warning, arrows with glucose readings, and Freestyle app. Patient very appreciative.  Gave pt. 2 additional sensors for  home use.  Thank you, Billy Fischer. Ryver Zadrozny, RN, MSN, CDCES  Diabetes Coordinator Inpatient Glycemic Control Team Team Pager [phone] (8am-5pm) 11/25/2023 8:45 AM

## 2023-11-25 NOTE — Telephone Encounter (Signed)
Pharmacy Patient Advocate Encounter  Received notification from [facility] that Prior Authorization for FreeStyle Libre 3 Sensor  has been APPROVED from 11/25/2023 to 11/24/2024. Ran test claim, Copay is $50.00. This test claim was processed through  [facility]- copay amounts may vary at other pharmacies due to pharmacy/plan contracts, or as the patient moves through the different stages of their insurance plan.   PA #/Case ID/Reference #: [id number]

## 2023-11-26 DIAGNOSIS — N179 Acute kidney failure, unspecified: Secondary | ICD-10-CM | POA: Diagnosis not present

## 2023-11-26 LAB — RENAL FUNCTION PANEL
Albumin: 2.6 g/dL — ABNORMAL LOW (ref 3.5–5.0)
Anion gap: 6 (ref 5–15)
BUN: 18 mg/dL (ref 8–23)
CO2: 21 mmol/L — ABNORMAL LOW (ref 22–32)
Calcium: 8.6 mg/dL — ABNORMAL LOW (ref 8.9–10.3)
Chloride: 114 mmol/L — ABNORMAL HIGH (ref 98–111)
Creatinine, Ser: 1.49 mg/dL — ABNORMAL HIGH (ref 0.44–1.00)
GFR, Estimated: 39 mL/min — ABNORMAL LOW (ref >60)
Glucose, Bld: 146 mg/dL — ABNORMAL HIGH (ref 70–99)
Phosphorus: 2.3 mg/dL — ABNORMAL LOW (ref 2.5–4.6)
Potassium: 3.7 mmol/L (ref 3.5–5.1)
Sodium: 141 mmol/L (ref 135–145)

## 2023-11-26 LAB — GLUCOSE, CAPILLARY
Glucose-Capillary: 127 mg/dL — ABNORMAL HIGH (ref 70–99)
Glucose-Capillary: 144 mg/dL — ABNORMAL HIGH (ref 70–99)
Glucose-Capillary: 205 mg/dL — ABNORMAL HIGH (ref 70–99)
Glucose-Capillary: 217 mg/dL — ABNORMAL HIGH (ref 70–99)

## 2023-11-26 LAB — COMPREHENSIVE METABOLIC PANEL
ALT: 60 U/L — ABNORMAL HIGH (ref 0–44)
AST: 89 U/L — ABNORMAL HIGH (ref 15–41)
Albumin: 2.4 g/dL — ABNORMAL LOW (ref 3.5–5.0)
Alkaline Phosphatase: 116 U/L (ref 38–126)
Anion gap: 8 (ref 5–15)
BUN: 18 mg/dL (ref 8–23)
CO2: 19 mmol/L — ABNORMAL LOW (ref 22–32)
Calcium: 8.4 mg/dL — ABNORMAL LOW (ref 8.9–10.3)
Chloride: 112 mmol/L — ABNORMAL HIGH (ref 98–111)
Creatinine, Ser: 1.54 mg/dL — ABNORMAL HIGH (ref 0.44–1.00)
GFR, Estimated: 37 mL/min — ABNORMAL LOW (ref >60)
Glucose, Bld: 150 mg/dL — ABNORMAL HIGH (ref 70–99)
Potassium: 3.6 mmol/L (ref 3.5–5.1)
Sodium: 139 mmol/L (ref 135–145)
Total Bilirubin: 0.7 mg/dL (ref <1.2)
Total Protein: 5.5 g/dL — ABNORMAL LOW (ref 6.5–8.1)

## 2023-11-26 LAB — MAGNESIUM: Magnesium: 1.7 mg/dL (ref 1.7–2.4)

## 2023-11-26 MED ORDER — INSULIN ASPART 100 UNIT/ML IJ SOLN
3.0000 [IU] | Freq: Three times a day (TID) | INTRAMUSCULAR | Status: DC
  Administered 2023-11-26 – 2023-11-27 (×3): 3 [IU] via SUBCUTANEOUS

## 2023-11-26 NOTE — Plan of Care (Signed)

## 2023-11-26 NOTE — Progress Notes (Signed)
[facility] PROGRESS NOTE    Progress Note  Melissa [name]  ZOX:[id number] DOB: 12-28-1960 DOA: 11/23/2023 PCP: Porfirio Oar, PA     Brief Narrative:   Melissa [name] is an 62 y.o. female past medical history significant for diabetes mellitus type 2, essential hypertension, hyperlipidemia liver cirrhosis, chronic kidney disease 3b comes in with acute kidney injury and electrolyte abnormalities  Assessment/Plan:   AKI (acute kidney injury)on chronic kidney disease stage IIIb: With a baseline creatinine anywhere around 1.3-1.6 Was started on IV fluids creatinine has returned to baseline.  Hyperglycemic hyperosmolar nonketotic state: Likely due to noncompliance. Oral hypoglycemic agents were held on admission. Started on long-acting insulin plus sliding scale as blood glucose well-controlled. A1c of 13 she will need insulin to go home with.  Elevated blood pressure without a diagnosis of hypertension: Blood pressure is well-controlled.  Hypovolemic hyponatremia: Resolved with IV fluids.  Liver cirrhosis: Continue nadolol  Hypokalemia: Repleted IV now improved.  Oral thrush: Continue nystatin.  Hypophosphatemia: Repleted IV, renal panels pending this morning.   DVT prophylaxis: lovenox Family Communication:none Status is: Inpatient Remains inpatient appropriate because: Hyperglycemic hyperosmolar nonketotic state    Code Status:     Code Status Orders  (From admission, onward)           Start     Ordered   11/23/23 1456  Full code  Continuous       Question:  By:  Answer:  Consent: discussion documented in EHR   11/23/23 1456           Code Status History     Date Active Date Inactive Code Status Order ID Comments User Context   06/29/2019 1755 07/02/2019 2048 Full Code [id number]  Alessandra Bevels, MD ED   06/11/2015 2314 06/13/2015 1854 Full Code [id number]  Therisa Doyne, MD Inpatient         IV Access:    Peripheral IV   Procedures and diagnostic studies:   No results found.   Medical Consultants:   None.   Subjective:    Melissa [name] feels better tolerating her diet.  Objective:    Vitals:   11/25/23 1513 11/25/23 2111 11/26/23 0443 11/26/23 0727  BP: 100/63 106/71 112/60 100/64  Pulse: 76 69 72 73  Resp: 18 18 18 17   Temp: 98 F (36.7 C) 99.2 F (37.3 C) 98.8 F (37.1 C) 97.9 F (36.6 C)  TempSrc:      SpO2: 100% 100% 100% 98%  Weight:      Height:       SpO2: 98 %   Intake/Output Summary (Last 24 hours) at 11/26/2023 1002 Last data filed at 11/26/2023 0900 Gross per 24 hour  Intake 886.05 ml  Output --  Net 886.05 ml   Filed Weights   11/23/23 1040  Weight: 91.6 kg    Exam: General exam: In no acute distress. Respiratory system: Good air movement and clear to auscultation. Cardiovascular system: S1 & S2 heard, RRR. No JVD. Gastrointestinal system: Abdomen is nondistended, soft and nontender.  Extremities: No pedal edema. Skin: No rashes, lesions or ulcers Psychiatry: Judgement and insight appear normal. Mood & affect appropriate.    Data Reviewed:    Labs: Basic Metabolic Panel: Recent Labs  Lab 11/23/23 1208 11/23/23 1709 11/24/23 0434 11/25/23 1801 11/26/23 0511  NA 130*  --  134* 137 139  K 3.7  --  2.3* 3.8 3.6  CL 91*  --  99 109 112*  CO2 22  --  22 19* 19*  GLUCOSE 471*  --  146* 247* 150*  BUN 52*  --  40* 23 18  CREATININE 2.99* 2.60* 2.13* 1.87* 1.54*  CALCIUM 10.2  --  9.1 8.6* 8.4*  MG  --   --  2.0 2.0 1.7  PHOS  --   --   --  1.1*  --    GFR Estimated Creatinine Clearance: 39.3 mL/min (A) (by C-G formula based on SCr of 1.54 mg/dL (H)). Liver Function Tests: Recent Labs  Lab 11/23/23 1208 11/24/23 0434 11/25/23 1801 11/26/23 0511  AST 61* 59*  --  89*  ALT 32 30  --  60*  ALKPHOS 134* 105  --  116  BILITOT 1.9* 1.2*  --  0.7  PROT 8.7* 6.9  --  5.5*  ALBUMIN 3.7 3.0* 2.7* 2.4*   No  results for input(s): "LIPASE", "AMYLASE" in the last 168 hours. No results for input(s): "AMMONIA" in the last 168 hours. Coagulation profile No results for input(s): "INR", "PROTIME" in the last 168 hours. COVID-19 Labs  No results for input(s): "DDIMER", "FERRITIN", "LDH", "CRP" in the last 72 hours.  Lab Results  Component Value Date   SARSCOV2NAA NEGATIVE 06/29/2019    CBC: Recent Labs  Lab 11/23/23 1208 11/23/23 1709 11/24/23 0434  WBC 8.8 7.3 6.0  NEUTROABS 6.5  --   --   HGB 16.9* 16.0* 14.3  HCT 49.7* 46.6* 42.0  MCV 84.2 83.7 84.5  PLT 226 219 178   Cardiac Enzymes: No results for input(s): "CKTOTAL", "CKMB", "CKMBINDEX", "TROPONINI" in the last 168 hours. BNP (last 3 results) No results for input(s): "PROBNP" in the last 8760 hours. CBG: Recent Labs  Lab 11/25/23 0752 11/25/23 1209 11/25/23 1512 11/25/23 2111 11/26/23 0841  GLUCAP 126* 187* 261* 246* 127*   D-Dimer: No results for input(s): "DDIMER" in the last 72 hours. Hgb A1c: Recent Labs    11/23/23 1709  HGBA1C 13.0*   Lipid Profile: No results for input(s): "CHOL", "HDL", "LDLCALC", "TRIG", "CHOLHDL", "LDLDIRECT" in the last 72 hours. Thyroid function studies: No results for input(s): "TSH", "T4TOTAL", "T3FREE", "THYROIDAB" in the last 72 hours.  Invalid input(s): "FREET3" Anemia work up: No results for input(s): "VITAMINB12", "FOLATE", "FERRITIN", "TIBC", "IRON", "RETICCTPCT" in the last 72 hours. Sepsis Labs: Recent Labs  Lab 11/23/23 1208 11/23/23 1709 11/23/23 1842 11/24/23 0434  WBC 8.8 7.3  --  6.0  LATICACIDVEN  --  2.6* 2.2*  --    Microbiology No results found for this or any previous visit (from the past 240 hours).   Medications:    heparin  5,000 Units Subcutaneous Q8H   insulin aspart  0-15 Units Subcutaneous TID WC   insulin aspart  0-5 Units Subcutaneous QHS   insulin glargine-yfgn  13 Units Subcutaneous Daily   nadolol  40 mg Oral Daily   nystatin  5 mL Oral  QID   phosphorus  500 mg Oral TID   Continuous Infusions:  sodium chloride 75 mL/hr at 11/26/23 0420      LOS: 3 days   Marinda Elk  [facility]  11/26/2023, 10:02 AM

## 2023-11-27 DIAGNOSIS — N179 Acute kidney failure, unspecified: Secondary | ICD-10-CM | POA: Diagnosis not present

## 2023-11-27 DIAGNOSIS — E1101 Type 2 diabetes mellitus with hyperosmolarity with coma: Secondary | ICD-10-CM | POA: Diagnosis not present

## 2023-11-27 LAB — GLUCOSE, CAPILLARY: Glucose-Capillary: 106 mg/dL — ABNORMAL HIGH (ref 70–99)

## 2023-11-27 MED ORDER — FLUCONAZOLE 100 MG PO TABS: 100.0000 mg | ORAL_TABLET | Freq: Every day | ORAL | 0 refills | Status: AC

## 2023-11-27 MED ORDER — INSULIN DETEMIR 100 UNIT/ML FLEXPEN: 12.0000 [IU] | PEN_INJECTOR | Freq: Every day | SUBCUTANEOUS | 11 refills | Status: DC

## 2023-11-27 MED ORDER — FLUCONAZOLE 100 MG PO TABS: 100.0000 mg | ORAL_TABLET | Freq: Every day | ORAL | 0 refills | Status: DC

## 2023-11-27 MED ORDER — INSULIN ASPART 100 UNIT/ML FLEXPEN: 4.0000 [IU] | PEN_INJECTOR | Freq: Three times a day (TID) | SUBCUTANEOUS | 11 refills | Status: DC

## 2023-11-27 MED ORDER — INSULIN ASPART 100 UNIT/ML FLEXPEN
4.0000 [IU] | PEN_INJECTOR | Freq: Three times a day (TID) | SUBCUTANEOUS | 11 refills | Status: AC
  Filled 2023-11-29: qty 3, 25d supply, fill #0
  Filled 2023-12-20: qty 15, 28d supply, fill #0

## 2023-11-27 MED ORDER — INSULIN PEN NEEDLE 31G X 6 MM MISC: 1.0000 | Freq: Two times a day (BID) | 3 refills | Status: AC

## 2023-11-27 MED ORDER — LEVEMIR FLEXTOUCH 100 UNIT/ML ~~LOC~~ SOPN
12.0000 [IU] | PEN_INJECTOR | Freq: Every day | SUBCUTANEOUS | 11 refills | Status: AC
  Filled 2023-11-29: qty 3, 25d supply, fill #0

## 2023-11-27 MED ORDER — INSULIN PEN NEEDLE 31G X 6 MM MISC: 1.0000 | Freq: Two times a day (BID) | 3 refills | Status: DC

## 2023-11-27 NOTE — Plan of Care (Signed)

## 2023-11-27 NOTE — Discharge Summary (Signed)
Physician Discharge Summary  Melissa [name] [id number]:[id number] DOB: 04/05/1961 DOA: 11/23/2023  PCP: Porfirio Oar, PA  Admit date: 11/23/2023 Discharge date: 11/27/2023  Admitted From: Home Disposition:  Home  Recommendations for Outpatient Follow-up:  Follow up with PCP in 1-2 weeks Please obtain BMP/CBC in one week Titrate insulin as tolerated.  Home Health:No Equipment/Devices:None  Discharge Condition:Stable CODE STATUS:Full Diet recommendation: Heart Healthy   Brief/Interim Summary: 62 y.o. female past medical history significant for diabetes mellitus type 2, essential hypertension, hyperlipidemia liver cirrhosis, chronic kidney disease 3b comes in with acute kidney injury and electrolyte abnormalities   Discharge Diagnoses:  Principal Problem:   AKI (acute kidney injury) (HCC) Active Problems:   Hepatic cirrhosis (HCC)   HTN (hypertension)   GERD (gastroesophageal reflux disease)   History of esophageal varices   Hyperlipidemia  Acute kidney injury on chronic kidney disease stage IIIb: With a baseline creatinine anywhere from 1.3-1.6. There is likely due to hypovolemia this resolved with IV fluids.  Hyperglycemic hyperosmolar nonketotic state/diabetes mellitus type 2: She relates noncompliant with her medication. Oral hypoglycemic agents were held on admission, her A1c was 13. She was started on long-acting insulin plus sliding scale blood glucose control. She has been on insulin in the past diabetes educator was consulted.  Elevated blood pressure without a diagnosis of hypertension: Likely reactive for blood pressure is improved now.  Hypovolemic hyponatremia: Resolved with IV fluids.  Liver cirrhosis: Continue nadolol.  Hypokalemia: Repleted now improved.  Oral thrush: Resolved with nystatin. She will continue Diflucan as an outpatient.  Hypophosphatemia: Was repleted now resolved.  Discharge Instructions  Discharge Instructions     Diet  - low sodium heart healthy   Complete by: As directed    Increase activity slowly   Complete by: As directed       Allergies as of 11/27/2023       Reactions   Codeine Nausea And Vomiting   Erythromycin Nausea And Vomiting   Ketek [telithromycin] Nausea And Vomiting   Oxycodone-acetaminophen Nausea And Vomiting   Penicillins Nausea And Vomiting   Propoxyphene N-acetaminophen Nausea And Vomiting   Sulfonamide Derivatives Nausea And Vomiting   Vicodin [hydrocodone-acetaminophen] Nausea And Vomiting        Medication List     STOP taking these medications    cefdinir 300 MG capsule Commonly known as: OMNICEF   guaiFENesin 600 MG 12 hr tablet Commonly known as: MUCINEX       TAKE these medications    Accu-Chek Guide test strip Generic drug: glucose blood Use to check blood sugar 3 times a day   acetaminophen 500 MG tablet Commonly known as: TYLENOL Take 1,000 mg by mouth every 6 (six) hours as needed for headache (pain).   aspirin EC 81 MG tablet Take 81 mg by mouth at bedtime.   cyclobenzaprine 10 MG tablet Commonly known as: FLEXERIL Take 1 tablet (10 mg total) by mouth 3 (three) times daily as needed for muscle spasms. What changed:  when to take this reasons to take this   insulin aspart 100 UNIT/ML FlexPen Commonly known as: NOVOLOG Inject 4 Units into the skin 3 (three) times daily with meals.   insulin detemir 100 UNIT/ML FlexPen Commonly known as: LEVEMIR Inject 12 Units into the skin daily.   Insulin Pen Needle 31G X 6 MM Misc 1 Device by Does not apply route 2 (two) times daily. What changed:  medication strength how much to take how to take this when to take this additional  instructions   Jardiance 10 MG Tabs tablet Generic drug: empagliflozin Take 10 mg by mouth at bedtime.   nadolol 40 MG tablet Commonly known as: CORGARD TAKE 1 TABLET (40 MG TOTAL) BY MOUTH DAILY What changed:  how much to take how to take this when to take  this additional instructions   Restasis 0.05 % ophthalmic emulsion Generic drug: cycloSPORINE Place 2 drops into both eyes 2 (two) times daily.   rosuvastatin 10 MG tablet Commonly known as: CRESTOR Take 10 mg by mouth at bedtime.   triamterene-hydrochlorothiazide 37.5-25 MG tablet Commonly known as: MAXZIDE-25 Take 1 tablet by mouth daily. What changed: when to take this        Allergies  Allergen Reactions   Codeine Nausea And Vomiting   Erythromycin Nausea And Vomiting   Ketek [Telithromycin] Nausea And Vomiting   Oxycodone-Acetaminophen Nausea And Vomiting   Penicillins Nausea And Vomiting   Propoxyphene N-Acetaminophen Nausea And Vomiting   Sulfonamide Derivatives Nausea And Vomiting   Vicodin [Hydrocodone-Acetaminophen] Nausea And Vomiting    Consultations: None   Procedures/Studies: CT Renal Stone Study Result Date: 11/23/2023 CLINICAL DATA:  Abdominal/flank pain.  Concern for kidney stone. EXAM: CT ABDOMEN AND PELVIS WITHOUT CONTRAST TECHNIQUE: Multidetector CT imaging of the abdomen and pelvis was performed following the standard protocol without IV contrast. RADIATION DOSE REDUCTION: This exam was performed according to the departmental dose-optimization program which includes automated exposure control, adjustment of the mA and/or kV according to patient size and/or use of iterative reconstruction technique. COMPARISON:  CT abdomen pelvis dated 06/29/2019. FINDINGS: Evaluation of this exam is limited in the absence of intravenous contrast. Lower chest: The visualized lung bases are clear. No intra-abdominal free air or free fluid. Hepatobiliary: Cirrhosis. No biliary dilatation. Layering small stones noted in the gallbladder. No pericholecystic fluid or evidence of acute cholecystitis by CT. Pancreas: Unremarkable. No pancreatic ductal dilatation or surrounding inflammatory changes. Spleen: Normal in size without focal abnormality. Adrenals/Urinary Tract: The adrenal  glands unremarkable. There is no hydronephrosis or nephrolithiasis on either side. The visualized ureters and urinary bladder appear unremarkable. Stomach/Bowel: There is sigmoid diverticulosis. There is moderate stool throughout the colon. There is no bowel obstruction or active inflammation. The appendix is normal. Vascular/Lymphatic: Mild aortoiliac atherosclerotic disease. The IVC is unremarkable. No portal venous gas. There is no adenopathy. Reproductive: Hysterectomy.  No suspicious adnexal masses. Other: None Musculoskeletal: Osteopenia with degenerative changes. No acute osseous pathology. IMPRESSION: 1. No acute intra-abdominal or pelvic pathology. No hydronephrosis or nephrolithiasis. 2. Sigmoid diverticulosis. No bowel obstruction. Normal appendix. 3. Cirrhosis. 4. Cholelithiasis. 5.  Aortic Atherosclerosis (ICD10-I70.0). Electronically Signed   By: Elgie Collard M.D.   On: 11/23/2023 21:02     Subjective: No complaints  Discharge Exam: Vitals:   11/27/23 0347 11/27/23 0735  BP: (!) 107/59 105/68  Pulse: 74 76  Resp: 18 17  Temp: 98.8 F (37.1 C) 98.5 F (36.9 C)  SpO2: 98% 99%   Vitals:   11/26/23 1705 11/26/23 1929 11/27/23 0347 11/27/23 0735  BP: 128/64 (!) 101/57 (!) 107/59 105/68  Pulse: 71 71 74 76  Resp: 18 18 18 17   Temp: 98.8 F (37.1 C) 98.5 F (36.9 C) 98.8 F (37.1 C) 98.5 F (36.9 C)  TempSrc:  Oral Oral Oral  SpO2: 99% 100% 98% 99%  Weight:      Height:        General: Pt is alert, awake, not in acute distress Cardiovascular: RRR, S1/S2 +, no rubs, no gallops  Respiratory: CTA bilaterally, no wheezing, no rhonchi Abdominal: Soft, NT, ND, bowel sounds + Extremities: no edema, no cyanosis    The results of significant diagnostics from this hospitalization (including imaging, microbiology, ancillary and laboratory) are listed below for reference.     Microbiology: No results found for this or any previous visit (from the past 240 hours).    Labs: BNP (last 3 results) No results for input(s): "BNP" in the last 8760 hours. Basic Metabolic Panel: Recent Labs  Lab 11/23/23 1208 11/23/23 1709 11/24/23 0434 11/25/23 1801 11/26/23 0511 11/26/23 1023  NA 130*  --  134* 137 139 141  K 3.7  --  2.3* 3.8 3.6 3.7  CL 91*  --  99 109 112* 114*  CO2 22  --  22 19* 19* 21*  GLUCOSE 471*  --  146* 247* 150* 146*  BUN 52*  --  40* 23 18 18   CREATININE 2.99* 2.60* 2.13* 1.87* 1.54* 1.49*  CALCIUM 10.2  --  9.1 8.6* 8.4* 8.6*  MG  --   --  2.0 2.0 1.7  --   PHOS  --   --   --  1.1*  --  2.3*   Liver Function Tests: Recent Labs  Lab 11/23/23 1208 11/24/23 0434 11/25/23 1801 11/26/23 0511 11/26/23 1023  AST 61* 59*  --  89*  --   ALT 32 30  --  60*  --   ALKPHOS 134* 105  --  116  --   BILITOT 1.9* 1.2*  --  0.7  --   PROT 8.7* 6.9  --  5.5*  --   ALBUMIN 3.7 3.0* 2.7* 2.4* 2.6*   No results for input(s): "LIPASE", "AMYLASE" in the last 168 hours. No results for input(s): "AMMONIA" in the last 168 hours. CBC: Recent Labs  Lab 11/23/23 1208 11/23/23 1709 11/24/23 0434  WBC 8.8 7.3 6.0  NEUTROABS 6.5  --   --   HGB 16.9* 16.0* 14.3  HCT 49.7* 46.6* 42.0  MCV 84.2 83.7 84.5  PLT 226 219 178   Cardiac Enzymes: No results for input(s): "CKTOTAL", "CKMB", "CKMBINDEX", "TROPONINI" in the last 168 hours. BNP: Invalid input(s): "POCBNP" CBG: Recent Labs  Lab 11/26/23 0841 11/26/23 1150 11/26/23 1654 11/26/23 2035 11/27/23 0732  GLUCAP 127* 144* 205* 217* 106*   D-Dimer No results for input(s): "DDIMER" in the last 72 hours. Hgb A1c No results for input(s): "HGBA1C" in the last 72 hours. Lipid Profile No results for input(s): "CHOL", "HDL", "LDLCALC", "TRIG", "CHOLHDL", "LDLDIRECT" in the last 72 hours. Thyroid function studies No results for input(s): "TSH", "T4TOTAL", "T3FREE", "THYROIDAB" in the last 72 hours.  Invalid input(s): "FREET3" Anemia work up No results for input(s): "VITAMINB12",  "FOLATE", "FERRITIN", "TIBC", "IRON", "RETICCTPCT" in the last 72 hours. Urinalysis    Component Value Date/Time   COLORURINE YELLOW 11/23/2023 1331   APPEARANCEUR CLEAR 11/23/2023 1331   LABSPEC 1.024 11/23/2023 1331   PHURINE 6.0 11/23/2023 1331   GLUCOSEU >=500 (A) 11/23/2023 1331   HGBUR NEGATIVE 11/23/2023 1331   BILIRUBINUR NEGATIVE 11/23/2023 1331   BILIRUBINUR negative 10/14/2023 1808   BILIRUBINUR neg 04/08/2015 1304   KETONESUR NEGATIVE 11/23/2023 1331   PROTEINUR NEGATIVE 11/23/2023 1331   UROBILINOGEN 0.2 10/14/2023 1808   UROBILINOGEN 0.2 06/11/2015 1802   NITRITE NEGATIVE 11/23/2023 1331   LEUKOCYTESUR NEGATIVE 11/23/2023 1331   Sepsis Labs Recent Labs  Lab 11/23/23 1208 11/23/23 1709 11/24/23 0434  WBC 8.8 7.3 6.0   Microbiology No results  found for this or any previous visit (from the past 240 hours).   Time coordinating discharge: Over 35 minutes  SIGNED:   Marinda Elk, MD  [facility] 11/27/2023, 9:40 AM Pager   If 7PM-7AM, please contact night-coverage [email or web address] Password [email or web address]

## 2023-11-28 ENCOUNTER — Other Ambulatory Visit (HOSPITAL_COMMUNITY): Payer: Self-pay

## 2023-11-28 ENCOUNTER — Other Ambulatory Visit (HOSPITAL_BASED_OUTPATIENT_CLINIC_OR_DEPARTMENT_OTHER): Payer: Self-pay

## 2023-11-29 ENCOUNTER — Other Ambulatory Visit (HOSPITAL_COMMUNITY): Payer: Self-pay

## 2023-11-29 ENCOUNTER — Other Ambulatory Visit: Payer: Self-pay

## 2023-11-29 MED ORDER — INSULIN LISPRO (1 UNIT DIAL) 100 UNIT/ML (KWIKPEN)
4.0000 [IU] | PEN_INJECTOR | Freq: Three times a day (TID) | SUBCUTANEOUS | 11 refills | Status: AC
  Filled 2023-11-29: qty 3, 25d supply, fill #0
  Filled 2023-12-20: qty 3, 25d supply, fill #1
  Filled 2024-01-13: qty 3, 25d supply, fill #2
  Filled 2024-02-07: qty 3, 25d supply, fill #3
  Filled 2024-03-01: qty 3, 25d supply, fill #4
  Filled 2024-03-26: qty 3, 25d supply, fill #5
  Filled 2024-04-18: qty 3, 25d supply, fill #6
  Filled 2024-05-11: qty 3, 25d supply, fill #7
  Filled 2024-06-04: qty 3, 25d supply, fill #8
  Filled 2024-06-28: qty 3, 25d supply, fill #9
  Filled 2024-07-23: qty 3, 25d supply, fill #10
  Filled 2024-08-21: qty 3, 25d supply, fill #11
  Filled 2024-09-12: qty 3, 25d supply, fill #12
  Filled 2024-10-17: qty 3, 25d supply, fill #13
  Filled 2024-11-09: qty 3, 25d supply, fill #14

## 2023-11-29 MED ORDER — BASAGLAR KWIKPEN 100 UNIT/ML ~~LOC~~ SOPN
12.0000 [IU] | PEN_INJECTOR | Freq: Every day | SUBCUTANEOUS | 11 refills | Status: AC
  Filled 2023-11-29: qty 3, 25d supply, fill #0
  Filled 2023-12-20: qty 15, 125d supply, fill #0

## 2023-11-29 MED ORDER — INSULIN GLARGINE 100 UNIT/ML SOLOSTAR PEN
12.0000 [IU] | PEN_INJECTOR | Freq: Every day | SUBCUTANEOUS | 11 refills | Status: AC
  Filled 2023-11-29: qty 3, 25d supply, fill #0
  Filled 2023-12-20: qty 3, 25d supply, fill #1
  Filled 2024-01-13: qty 3, 25d supply, fill #2
  Filled 2024-02-07: qty 3, 25d supply, fill #3
  Filled 2024-03-01: qty 3, 25d supply, fill #4
  Filled 2024-03-26: qty 3, 25d supply, fill #5
  Filled 2024-04-18: qty 3, 25d supply, fill #6
  Filled 2024-05-11: qty 3, 25d supply, fill #7
  Filled 2024-06-04: qty 3, 25d supply, fill #8
  Filled 2024-06-28: qty 3, 25d supply, fill #9
  Filled 2024-07-23: qty 3, 25d supply, fill #10
  Filled 2024-08-21: qty 3, 25d supply, fill #11
  Filled 2024-09-12: qty 3, 25d supply, fill #12
  Filled 2024-10-17: qty 3, 25d supply, fill #13
  Filled 2024-11-09: qty 3, 25d supply, fill #14

## 2023-12-01 ENCOUNTER — Other Ambulatory Visit (HOSPITAL_COMMUNITY): Payer: Self-pay

## 2023-12-05 ENCOUNTER — Other Ambulatory Visit (HOSPITAL_COMMUNITY): Payer: Self-pay

## 2023-12-06 ENCOUNTER — Other Ambulatory Visit (HOSPITAL_COMMUNITY): Payer: Self-pay

## 2023-12-07 ENCOUNTER — Other Ambulatory Visit (HOSPITAL_COMMUNITY): Payer: Self-pay

## 2023-12-08 ENCOUNTER — Other Ambulatory Visit (HOSPITAL_COMMUNITY): Payer: Self-pay

## 2023-12-08 MED ORDER — FREESTYLE LIBRE 3 SENSOR MISC
1.0000 | 3 refills | Status: AC
  Filled 2023-12-08: qty 6, 84d supply, fill #0
  Filled 2023-12-08: qty 2, 28d supply, fill #0
  Filled 2023-12-22: qty 2, 28d supply, fill #1
  Filled 2024-01-13: qty 2, 28d supply, fill #2
  Filled 2024-02-27: qty 2, 28d supply, fill #3
  Filled 2024-03-06: qty 2, 28d supply, fill #4
  Filled 2024-04-18: qty 2, 28d supply, fill #5
  Filled 2024-05-16: qty 2, 28d supply, fill #6
  Filled 2024-06-04: qty 2, 28d supply, fill #7
  Filled 2024-07-09: qty 2, 28d supply, fill #8
  Filled 2024-08-06: qty 2, 28d supply, fill #9
  Filled 2024-09-12: qty 2, 28d supply, fill #10
  Filled 2024-10-17: qty 2, 28d supply, fill #11

## 2023-12-09 ENCOUNTER — Other Ambulatory Visit (HOSPITAL_COMMUNITY): Payer: Self-pay

## 2023-12-09 ENCOUNTER — Other Ambulatory Visit: Payer: Self-pay

## 2023-12-09 ENCOUNTER — Encounter: Payer: Self-pay | Admitting: *Deleted

## 2023-12-09 ENCOUNTER — Ambulatory Visit
Admission: EM | Admit: 2023-12-09 | Discharge: 2023-12-09 | Disposition: A | Discharge status: Home / Self Care | Payer: 59 | Attending: Family | Admitting: Family

## 2023-12-09 DIAGNOSIS — M7989 Other specified soft tissue disorders: Secondary | ICD-10-CM | POA: Diagnosis not present

## 2023-12-09 DIAGNOSIS — M79675 Pain in left toe(s): Secondary | ICD-10-CM

## 2023-12-09 MED ORDER — DOXYCYCLINE HYCLATE 100 MG PO CAPS: 100.0000 mg | ORAL_CAPSULE | Freq: Two times a day (BID) | ORAL | 0 refills | Status: AC

## 2023-12-09 MED ORDER — PREDNISONE 10 MG (21) PO TBPK
ORAL_TABLET | Freq: Every day | ORAL | 0 refills | Status: AC
Start: 2023-12-09 — End: ?

## 2023-12-09 NOTE — ED Triage Notes (Addendum)
 Pt reports throbbing pain in left great toe and second toe. She is wearing a post-op shoe that she had at home. States she can see pus in the toe. Continuous blood glucose monitor reads 98

## 2023-12-09 NOTE — ED Provider Notes (Signed)
 [facility]    CSN: [id number] Arrival date & time: 12/09/23  1354      History   Chief Complaint Chief Complaint  Patient presents with   Toe Pain    HPI Melissa [name] is a 63 y.o. female.   63 year old female presents with left great toe pain that started 2 to 3 days ago.  Noticed that left great toe is more swollen and red and painful today.  Also has noticed on plantar aspect just below great toe that area is more swollen and feels that there is pus underneath the skin.  Husband applied Biofreeze to area with minimal relief.  Second toe on left foot also slightly tender.  No previous history of similar symptoms or gout.  No history of trauma to the area.  Does have a history of type 2 DM-- she was recently hospitalized on 12/25 due to blood sugar over 400 and acute renal failure with chronic kidney disease stage 3. Now has continuous glucose monitor with current sugar level = 98. Currently using Lantus  insulin  and has not had to use Humalog  recently. Blood sugars have remained less than 150. Other chronic health issues include HTN, hyperlipidemia, GERD, cirrhosis and chronic sinus allergies.  Currently on Maxide, Crestor , Jardiance, aspirin, Corgard  and Flexeril  as needed.  The history is provided by the patient.    Past Medical History:  Diagnosis Date   Acute renal failure (HCC) 06/11/2015   Allergy    sinus allergies   Chronic kidney disease (CKD), stage III (moderate) (HCC)    thelbert 06/11/2015   Cirrhosis (HCC)    secondary to sarcoidosis   Diabetes mellitus without complication (HCC) dx'd 06/11/2015   thelbert 06/11/2015   Esophageal varices (HCC)    GERD (gastroesophageal reflux disease)    Heart murmur    Hypertension    Sarcoidosis     Patient Active Problem List   Diagnosis Date Noted   AKI (acute kidney injury) (HCC) 11/23/2023   Diabetic hyperosmolar non-ketotic state (HCC) 06/29/2019   Low back pain 09/27/2017   Type 2 diabetes mellitus  with hyperglycemia, with long-term current use of insulin  (HCC) 12/19/2015   Hyperlipidemia 11/11/2015   History of esophageal varices 12/13/2014   HTN (hypertension) 05/07/2012   GERD (gastroesophageal reflux disease) 05/07/2012   AR (allergic rhinitis) 05/07/2012   BMI 40.0-44.9, adult (HCC) 05/07/2012   Hepatic cirrhosis (HCC) 11/25/2009   SARCOIDOSIS 11/10/2009   CARDIAC MURMUR 11/10/2009    Past Surgical History:  Procedure Laterality Date   ABDOMINAL HYSTERECTOMY  1997   partial   CARPAL TUNNEL RELEASE Right 1987   CESAREAN SECTION  1979   FOOT SURGERY Bilateral 2018   REDUCTION MAMMAPLASTY Bilateral 2005   TUBAL LIGATION  1981    OB History   No obstetric history on file.      Home Medications    Prior to Admission medications   Medication Sig Start Date End Date Taking? Authorizing Provider  acetaminophen  (TYLENOL ) 500 MG tablet Take 1,000 mg by mouth every 6 (six) hours as needed for headache (pain).   Yes [provider]  aspirin EC 81 MG tablet Take 81 mg by mouth at bedtime.   Yes [provider]  Continuous Glucose Sensor (FREESTYLE LIBRE 3 SENSOR) MISC Place 1 sensor onto the skin every 14 (fourteen) days. 12/08/23  Yes   cyclobenzaprine  (FLEXERIL ) 10 MG tablet Take 1 tablet (10 mg total) by mouth 3 (three) times daily as needed for  muscle spasms. Patient taking differently: Take 10 mg by mouth daily as needed for muscle spasms (back pain). 09/27/17  Yes Jeffery, Chelle, PA  doxycycline  (VIBRAMYCIN ) 100 MG capsule Take 1 capsule (100 mg total) by mouth 2 (two) times daily for 7 days. 12/09/23 12/16/23 Yes Rhena Glace, Jenkins Lesches, NP  insulin  glargine (LANTUS ) 100 UNIT/ML Solostar Pen Inject 12 Units into the skin daily. 11/29/23  Yes Odell Celinda Balo, MD  insulin  lispro (HUMALOG ) 100 UNIT/ML KwikPen Inject 4 Units into the skin 3 (three) times daily with meals. 11/29/23  Yes Odell Celinda Balo, MD  JARDIANCE 10 MG TABS tablet Take 10 mg by mouth  at bedtime.   Yes [provider]  nadolol  (CORGARD ) 40 MG tablet TAKE 1 TABLET (40 MG TOTAL) BY MOUTH DAILY Patient taking differently: Take 40 mg by mouth at bedtime. 09/27/17  Yes Jeffery, Chelle, PA  predniSONE  (STERAPRED UNI-PAK 21 TAB) 10 MG (21) TBPK tablet Take by mouth daily. Take 6 tabs by mouth on the first day then decrease by 1 tablet each day until finished on day 6. 12/09/23  Yes Darshay Deupree, Jenkins Lesches, NP  RESTASIS 0.05 % ophthalmic emulsion Place 2 drops into both eyes 2 (two) times daily.   Yes [provider]  rosuvastatin  (CRESTOR ) 10 MG tablet Take 10 mg by mouth at bedtime.   Yes [provider]  triamterene -hydrochlorothiazide  (MAXZIDE -25) 37.5-25 MG tablet Take 1 tablet by mouth daily. Patient taking differently: Take 1 tablet by mouth at bedtime. 09/27/17  Yes Jeffery, Chelle, PA  glucose blood (ACCU-CHEK GUIDE) test strip Use to check blood sugar 3 times a day 07/31/19   Trixie File, MD  insulin  aspart (NOVOLOG ) 100 UNIT/ML FlexPen Inject 4 Units into the skin 3 (three) times daily with meals. 11/27/23   Odell Celinda Balo, MD  insulin  detemir (LEVEMIR  FLEXTOUCH) 100 UNIT/ML FlexPen Inject 12 Units into the skin daily. 11/27/23   Odell Celinda Balo, MD  Insulin  Glargine (BASAGLAR  KWIKPEN) 100 UNIT/ML Inject 12 Units into the skin daily. 11/29/23   Odell Celinda Balo, MD  Insulin  Pen Needle 31G X 6 MM MISC 1 Device by Does not apply route 2 (two) times daily. 11/27/23   Odell Celinda Balo, MD    Family History Family History  Problem Relation Age of Onset   Hypertension Mother    Hypertension Brother    Kidney disease Brother        ESRD on Dialysis   Diabetes Brother    CAD Sister    Colon cancer Neg Hx    Esophageal cancer Neg Hx    Rectal cancer Neg Hx    Stomach cancer Neg Hx     Social History Social History   Tobacco Use   Smoking status: Never   Smokeless tobacco: Never  Vaping Use   Vaping status: Never Used   Substance Use Topics   Alcohol use: No    Alcohol/week: 0.0 standard drinks of alcohol   Drug use: No     Allergies   Codeine, Erythromycin, Ketek [telithromycin], Oxycodone-acetaminophen , Penicillins, Propoxyphene n-acetaminophen , Sulfonamide derivatives, and Vicodin [hydrocodone-acetaminophen ]   Review of Systems Review of Systems  Constitutional:  Positive for activity change. Negative for appetite change, chills, diaphoresis, fatigue and fever.  Respiratory:  Negative for chest tightness and shortness of breath.   Musculoskeletal:  Positive for arthralgias, gait problem, joint swelling (left toe) and myalgias.  Skin:  Positive for color change. Negative for rash.  Allergic/Immunologic: Positive for environmental allergies. Negative for food  allergies.  Neurological:  Negative for tremors, seizures, syncope, speech difficulty and numbness.  Hematological:  Negative for adenopathy. Does not bruise/bleed easily.     Physical Exam Triage Vital Signs ED Triage Vitals  Encounter Vitals Group     BP 12/09/23 1533 110/77     Systolic BP Percentile --      Diastolic BP Percentile --      Pulse Rate 12/09/23 1533 75     Resp 12/09/23 1533 18     Temp 12/09/23 1533 98 F (36.7 C)     Temp Source 12/09/23 1533 Oral     SpO2 12/09/23 1533 99 %     Weight --      Height --      Head Circumference --      Peak Flow --      Pain Score 12/09/23 1529 8     Pain Loc --      Pain Education --      Exclude from Growth Chart --    No data found.  Updated Vital Signs BP 110/77 (BP Location: Right Arm)   Pulse 75   Temp 98 F (36.7 C) (Oral)   Resp 18   SpO2 99%   Visual Acuity Right Eye Distance:   Left Eye Distance:   Bilateral Distance:    Right Eye Near:   Left Eye Near:    Bilateral Near:     Physical Exam Vitals and nursing note reviewed.  Constitutional:      General: She is awake. She is not in acute distress.    Appearance: She is well-developed.      Comments: She is sitting in the exam chair in no acute distress but appears uncomfortable due to left toe pain.   HENT:     Head: Normocephalic and atraumatic.     Right Ear: Hearing normal.     Left Ear: Hearing normal.  Cardiovascular:     Rate and Rhythm: Normal rate.  Pulmonary:     Effort: Pulmonary effort is normal.  Musculoskeletal:        General: Swelling and tenderness present. Normal range of motion.     Right foot: Normal. Normal range of motion and normal capillary refill. Normal pulse.     Left foot: Normal range of motion and normal capillary refill. Swelling and tenderness present. Normal pulse.       Feet:     Comments: Left great toe swollen with redness extending at base of metatarsal. Very tender. Has full range of motion but with pain. Plantar aspect of left foot has slight swelling just below left great toe with slightly clear fluid filled area- appearing more blister-like. No redness. No discharge. Tender. Good sensation and pulses. No neuro deficits noted.   Skin:    General: Skin is warm and dry.     Capillary Refill: Capillary refill takes less than 2 seconds.     Findings: Erythema present. No abrasion, bruising, ecchymosis, lesion or petechiae.  Neurological:     General: No focal deficit present.     Mental Status: She is alert and oriented to person, place, and time.     Sensory: Sensation is intact. No sensory deficit.     Motor: Motor function is intact.     Gait: Gait abnormal.     Comments: Difficulty walking due to left toe pain.   Psychiatric:        Mood and Affect: Mood normal.  Behavior: Behavior normal. Behavior is cooperative.        Thought Content: Thought content normal.        Judgment: Judgment normal.      UC Treatments / Results  Labs (all labs ordered are listed, but only abnormal results are displayed) Labs Reviewed - No data to display  EKG   Radiology No results found.  Procedures Procedures (including critical  care time)  Medications Ordered in UC Medications - No data to display  Initial Impression / Assessment and Plan / UC Course  I have reviewed the triage vital signs and the nursing notes.  Pertinent labs & imaging results that were available during my care of the patient were reviewed by me and considered in my medical decision making (see chart for details).     Reviewed with patient possible causes of left great toe pain and swelling- discussed high suspicion for gout. Patient has recent acute kidney injury so NSAIDs are discouraged. Also limited Tylenol  use due to cirrhosis. Patient has been on Prednisone  in the past- will trial Prednisone  10mg  6 day dose pack as directed. Can increase sugar levels and raise blood pressure so continue to monitor. Discussed with patient no pus seen but fluid present on plantar aspect of foot and skin is dry with small fissures. Concern over developing bacterial infection especially since diabetic and now being placed on oral steroids- therefore will start Doxycycline  100mg  twice a day for 7 days- take with food. Keep left foot elevated as much as possible. Follow-up with her PCP in 3 to 4 days if not improving or sooner if worsening.   Final Clinical Impressions(s) / UC Diagnoses   Final diagnoses:  Great toe pain, left  Swelling of toe of left foot     Discharge Instructions      Recommend start Prednisone  10mg  tablets - take 6 tablets today then decrease by 1 tablet each day until finished on day 6- take with food. This can increase your blood sugar levels as well as your blood pressure so continue to monitor. Also recommend start Doxycycline  100mg  twice a day for 7 days- take with food for possible skin infection. Keep foot elevated as much as possible. Follow-up with your PCP in 3 to 4 days if not improving or sooner if worsening.      ED Prescriptions     Medication Sig Dispense Auth. Provider   predniSONE  (STERAPRED UNI-PAK 21 TAB) 10 MG  (21) TBPK tablet Take by mouth daily. Take 6 tabs by mouth on the first day then decrease by 1 tablet each day until finished on day 6. 21 tablet Evelene Roussin, Jenkins Lesches, NP   doxycycline  (VIBRAMYCIN ) 100 MG capsule Take 1 capsule (100 mg total) by mouth 2 (two) times daily for 7 days. 14 capsule Nathyn Luiz, Jenkins Lesches, NP      PDMP not reviewed this encounter.   Pearl Jenkins Lesches, NP 12/10/23 [phone]

## 2023-12-09 NOTE — Discharge Instructions (Signed)
 Recommend start Prednisone  10mg  tablets - take 6 tablets today then decrease by 1 tablet each day until finished on day 6- take with food. This can increase your blood sugar levels as well as your blood pressure so continue to monitor. Also recommend start Doxycycline  100mg  twice a day for 7 days- take with food for possible skin infection. Keep foot elevated as much as possible. Follow-up with your PCP in 3 to 4 days if not improving or sooner if worsening.

## 2023-12-12 ENCOUNTER — Other Ambulatory Visit (HOSPITAL_COMMUNITY): Payer: Self-pay

## 2023-12-12 MED ORDER — FREESTYLE LIBRE 3 READER DEVI
3 refills | Status: AC
  Filled 2023-12-12: qty 1, 90d supply, fill #0

## 2023-12-20 ENCOUNTER — Other Ambulatory Visit (HOSPITAL_COMMUNITY): Payer: Self-pay

## 2023-12-22 ENCOUNTER — Other Ambulatory Visit (HOSPITAL_COMMUNITY): Payer: Self-pay

## 2023-12-23 ENCOUNTER — Other Ambulatory Visit (HOSPITAL_COMMUNITY): Payer: Self-pay

## 2024-01-13 ENCOUNTER — Other Ambulatory Visit (HOSPITAL_COMMUNITY): Payer: Self-pay

## 2024-02-07 ENCOUNTER — Other Ambulatory Visit (HOSPITAL_COMMUNITY): Payer: Self-pay

## 2024-02-22 ENCOUNTER — Other Ambulatory Visit (HOSPITAL_COMMUNITY): Payer: Self-pay

## 2024-02-22 MED ORDER — TELMISARTAN 20 MG PO TABS
20.0000 mg | ORAL_TABLET | Freq: Every day | ORAL | 3 refills | Status: DC
  Filled 2024-02-22 (×2): qty 30, 30d supply, fill #0
  Filled 2024-03-20: qty 30, 30d supply, fill #1
  Filled 2024-04-16: qty 30, 30d supply, fill #2

## 2024-02-27 ENCOUNTER — Other Ambulatory Visit (HOSPITAL_COMMUNITY): Payer: Self-pay

## 2024-03-01 ENCOUNTER — Other Ambulatory Visit (HOSPITAL_COMMUNITY): Payer: Self-pay

## 2024-03-02 ENCOUNTER — Other Ambulatory Visit (HOSPITAL_COMMUNITY): Payer: Self-pay

## 2024-03-06 ENCOUNTER — Other Ambulatory Visit (HOSPITAL_COMMUNITY): Payer: Self-pay

## 2024-03-20 ENCOUNTER — Other Ambulatory Visit (HOSPITAL_COMMUNITY): Payer: Self-pay

## 2024-03-26 ENCOUNTER — Other Ambulatory Visit (HOSPITAL_COMMUNITY): Payer: Self-pay

## 2024-04-16 ENCOUNTER — Other Ambulatory Visit (HOSPITAL_COMMUNITY): Payer: Self-pay

## 2024-04-18 ENCOUNTER — Other Ambulatory Visit (HOSPITAL_COMMUNITY): Payer: Self-pay

## 2024-04-20 ENCOUNTER — Other Ambulatory Visit (HOSPITAL_COMMUNITY): Payer: Self-pay

## 2024-04-20 MED ORDER — TELMISARTAN 40 MG PO TABS
40.0000 mg | ORAL_TABLET | Freq: Every day | ORAL | 3 refills | Status: DC
  Filled 2024-04-20: qty 30, 30d supply, fill #0
  Filled 2024-05-17: qty 30, 30d supply, fill #1

## 2024-04-24 ENCOUNTER — Other Ambulatory Visit (HOSPITAL_COMMUNITY): Payer: Self-pay

## 2024-04-24 ENCOUNTER — Other Ambulatory Visit (HOSPITAL_BASED_OUTPATIENT_CLINIC_OR_DEPARTMENT_OTHER): Payer: Self-pay

## 2024-05-11 ENCOUNTER — Other Ambulatory Visit (HOSPITAL_COMMUNITY): Payer: Self-pay

## 2024-05-16 ENCOUNTER — Other Ambulatory Visit (HOSPITAL_COMMUNITY): Payer: Self-pay

## 2024-05-17 ENCOUNTER — Other Ambulatory Visit (HOSPITAL_COMMUNITY): Payer: Self-pay

## 2024-05-17 MED ORDER — ALLOPURINOL 100 MG PO TABS
100.0000 mg | ORAL_TABLET | Freq: Every day | ORAL | 3 refills | Status: AC
  Filled 2024-05-17: qty 30, 30d supply, fill #0
  Filled 2024-06-04 – 2024-06-11 (×3): qty 30, 30d supply, fill #1
  Filled 2024-07-09: qty 30, 30d supply, fill #2
  Filled 2024-08-09: qty 30, 30d supply, fill #3
  Filled 2024-09-03 – 2024-09-06 (×4): qty 30, 30d supply, fill #4
  Filled 2024-10-05: qty 30, 30d supply, fill #5
  Filled 2024-11-05: qty 30, 30d supply, fill #6
  Filled 2024-12-04: qty 30, 30d supply, fill #7
  Filled 2025-01-01: qty 30, 30d supply, fill #8

## 2024-05-17 MED ORDER — TRIAMTERENE-HCTZ 37.5-25 MG PO TABS
1.0000 | ORAL_TABLET | Freq: Every day | ORAL | 5 refills | Status: DC
  Filled 2024-05-17: qty 30, 30d supply, fill #0
  Filled 2024-06-07 – 2024-06-11 (×2): qty 30, 30d supply, fill #1
  Filled 2024-07-09: qty 30, 30d supply, fill #2
  Filled 2024-08-09: qty 30, 30d supply, fill #3
  Filled 2024-09-03: qty 30, 30d supply, fill #4
  Filled 2024-10-02: qty 30, 30d supply, fill #5

## 2024-05-17 MED ORDER — TELMISARTAN 20 MG PO TABS
20.0000 mg | ORAL_TABLET | Freq: Every day | ORAL | 3 refills | Status: AC
  Filled 2024-05-17: qty 30, 30d supply, fill #0
  Filled 2024-06-07: qty 30, 30d supply, fill #1
  Filled 2024-07-09: qty 30, 30d supply, fill #2
  Filled 2024-08-09: qty 30, 30d supply, fill #3
  Filled 2024-09-03: qty 30, 30d supply, fill #4
  Filled 2024-10-02: qty 30, 30d supply, fill #5
  Filled 2024-11-01: qty 30, 30d supply, fill #6
  Filled 2024-11-26: qty 30, 30d supply, fill #7
  Filled 2024-12-25: qty 30, 30d supply, fill #8

## 2024-06-04 ENCOUNTER — Other Ambulatory Visit (HOSPITAL_COMMUNITY): Payer: Self-pay

## 2024-06-07 ENCOUNTER — Other Ambulatory Visit (HOSPITAL_COMMUNITY): Payer: Self-pay

## 2024-06-08 ENCOUNTER — Other Ambulatory Visit (HOSPITAL_COMMUNITY): Payer: Self-pay

## 2024-06-11 ENCOUNTER — Other Ambulatory Visit (HOSPITAL_COMMUNITY): Payer: Self-pay

## 2024-06-28 ENCOUNTER — Other Ambulatory Visit (HOSPITAL_COMMUNITY): Payer: Self-pay

## 2024-06-29 ENCOUNTER — Other Ambulatory Visit (HOSPITAL_COMMUNITY): Payer: Self-pay

## 2024-07-09 ENCOUNTER — Other Ambulatory Visit (HOSPITAL_COMMUNITY): Payer: Self-pay

## 2024-07-11 ENCOUNTER — Other Ambulatory Visit (HOSPITAL_COMMUNITY): Payer: Self-pay

## 2024-07-11 MED ORDER — ALLOPURINOL 300 MG PO TABS
300.0000 mg | ORAL_TABLET | Freq: Every day | ORAL | 3 refills | Status: AC
  Filled 2024-07-11 (×2): qty 30, 30d supply, fill #0
  Filled 2024-08-03: qty 30, 30d supply, fill #1
  Filled 2024-09-03: qty 30, 30d supply, fill #2
  Filled 2024-10-02: qty 30, 30d supply, fill #3
  Filled 2024-10-26: qty 30, 30d supply, fill #4
  Filled 2024-11-26: qty 30, 30d supply, fill #5
  Filled 2024-12-24: qty 30, 30d supply, fill #6

## 2024-07-11 MED ORDER — AZELASTINE HCL 0.1 % NA SOLN
1.0000 | Freq: Two times a day (BID) | NASAL | 2 refills | Status: AC
  Filled 2024-07-11: qty 30, 30d supply, fill #0
  Filled 2024-08-03: qty 30, 30d supply, fill #1
  Filled 2024-09-03: qty 30, 30d supply, fill #2

## 2024-07-11 MED ORDER — ALLOPURINOL 200 MG PO TABS
200.0000 mg | ORAL_TABLET | Freq: Every day | ORAL | 3 refills | Status: DC
  Filled 2024-07-11 (×2): qty 30, 30d supply, fill #0

## 2024-07-19 ENCOUNTER — Other Ambulatory Visit (HOSPITAL_COMMUNITY): Payer: Self-pay

## 2024-07-19 MED ORDER — FREESTYLE LIBRE 3 SENSOR MISC
3 refills | Status: DC
  Filled 2024-07-19: qty 2, 28d supply, fill #0
  Filled 2024-07-19: qty 6, 84d supply, fill #0

## 2024-07-20 ENCOUNTER — Other Ambulatory Visit (HOSPITAL_COMMUNITY): Payer: Self-pay

## 2024-07-23 ENCOUNTER — Other Ambulatory Visit (HOSPITAL_COMMUNITY): Payer: Self-pay

## 2024-07-23 MED ORDER — FREESTYLE LIBRE 3 PLUS SENSOR MISC
4 refills | Status: AC
  Filled 2024-07-23: qty 6, 90d supply, fill #0
  Filled 2024-07-24: qty 2, 30d supply, fill #0
  Filled 2024-08-03: qty 1, 15d supply, fill #1
  Filled 2024-08-21: qty 2, 30d supply, fill #2
  Filled 2024-10-01: qty 2, 30d supply, fill #3
  Filled 2024-10-24: qty 2, 30d supply, fill #4
  Filled 2024-11-23: qty 2, 30d supply, fill #5
  Filled 2024-12-24: qty 2, 30d supply, fill #6

## 2024-07-24 ENCOUNTER — Other Ambulatory Visit (HOSPITAL_COMMUNITY): Payer: Self-pay

## 2024-08-03 ENCOUNTER — Other Ambulatory Visit (HOSPITAL_COMMUNITY): Payer: Self-pay

## 2024-08-06 ENCOUNTER — Other Ambulatory Visit (HOSPITAL_COMMUNITY): Payer: Self-pay

## 2024-08-09 ENCOUNTER — Other Ambulatory Visit (HOSPITAL_COMMUNITY): Payer: Self-pay

## 2024-08-21 ENCOUNTER — Other Ambulatory Visit (HOSPITAL_COMMUNITY): Payer: Self-pay

## 2024-08-29 ENCOUNTER — Other Ambulatory Visit (HOSPITAL_COMMUNITY): Payer: Self-pay

## 2024-09-03 ENCOUNTER — Other Ambulatory Visit (HOSPITAL_COMMUNITY): Payer: Self-pay

## 2024-09-04 ENCOUNTER — Other Ambulatory Visit (HOSPITAL_COMMUNITY): Payer: Self-pay

## 2024-09-05 ENCOUNTER — Other Ambulatory Visit (HOSPITAL_COMMUNITY): Payer: Self-pay

## 2024-09-12 ENCOUNTER — Other Ambulatory Visit (HOSPITAL_COMMUNITY): Payer: Self-pay

## 2024-10-01 ENCOUNTER — Other Ambulatory Visit (HOSPITAL_COMMUNITY): Payer: Self-pay

## 2024-10-02 ENCOUNTER — Other Ambulatory Visit (HOSPITAL_COMMUNITY): Payer: Self-pay

## 2024-10-17 ENCOUNTER — Other Ambulatory Visit (HOSPITAL_COMMUNITY): Payer: Self-pay

## 2024-10-26 ENCOUNTER — Other Ambulatory Visit: Payer: Self-pay

## 2024-10-30 ENCOUNTER — Other Ambulatory Visit (HOSPITAL_COMMUNITY): Payer: Self-pay

## 2024-11-01 ENCOUNTER — Other Ambulatory Visit: Payer: Self-pay

## 2024-11-01 ENCOUNTER — Other Ambulatory Visit (HOSPITAL_COMMUNITY): Payer: Self-pay

## 2024-11-02 ENCOUNTER — Other Ambulatory Visit (HOSPITAL_COMMUNITY): Payer: Self-pay

## 2024-11-02 MED ORDER — TRIAMTERENE-HCTZ 37.5-25 MG PO TABS
1.0000 | ORAL_TABLET | Freq: Every day | ORAL | 5 refills | Status: AC
  Filled 2024-11-02: qty 30, 30d supply, fill #0
  Filled 2024-11-26: qty 30, 30d supply, fill #1
  Filled 2024-12-25: qty 30, 30d supply, fill #2

## 2024-11-05 ENCOUNTER — Other Ambulatory Visit (HOSPITAL_COMMUNITY): Payer: Self-pay

## 2024-11-09 ENCOUNTER — Other Ambulatory Visit (HOSPITAL_COMMUNITY): Payer: Self-pay

## 2024-11-26 ENCOUNTER — Other Ambulatory Visit (HOSPITAL_COMMUNITY): Payer: Self-pay

## 2024-11-30 ENCOUNTER — Other Ambulatory Visit: Payer: Self-pay

## 2024-12-04 ENCOUNTER — Other Ambulatory Visit (HOSPITAL_COMMUNITY): Payer: Self-pay

## 2024-12-24 ENCOUNTER — Other Ambulatory Visit (HOSPITAL_COMMUNITY): Payer: Self-pay

## 2024-12-24 ENCOUNTER — Other Ambulatory Visit: Payer: Self-pay

## 2024-12-25 ENCOUNTER — Other Ambulatory Visit (HOSPITAL_COMMUNITY): Payer: Self-pay
